# Patient Record
Sex: Male | Born: 1935 | Race: White | Hispanic: No | Marital: Married | Smoking: Never smoker
Health system: Southern US, Community
[De-identification: ages and names within clinical notes are randomized; demographics above are authoritative.]

## PROBLEM LIST (undated history)

## (undated) DIAGNOSIS — I10 Essential (primary) hypertension: Secondary | ICD-10-CM

## (undated) DIAGNOSIS — E7800 Pure hypercholesterolemia, unspecified: Secondary | ICD-10-CM

## (undated) DIAGNOSIS — I639 Cerebral infarction, unspecified: Secondary | ICD-10-CM

## (undated) DIAGNOSIS — J189 Pneumonia, unspecified organism: Secondary | ICD-10-CM

## (undated) DIAGNOSIS — C801 Malignant (primary) neoplasm, unspecified: Secondary | ICD-10-CM

## (undated) DIAGNOSIS — R269 Unspecified abnormalities of gait and mobility: Secondary | ICD-10-CM

## (undated) HISTORY — DX: Essential (primary) hypertension: I10

## (undated) HISTORY — DX: Unspecified abnormalities of gait and mobility: R26.9

## (undated) HISTORY — DX: Pure hypercholesterolemia, unspecified: E78.00

---

## 2001-04-12 ENCOUNTER — Ambulatory Visit (HOSPITAL_COMMUNITY): Admission: RE | Admit: 2001-04-12 | Discharge: 2001-04-12 | Payer: Self-pay | Admitting: *Deleted

## 2017-02-04 ENCOUNTER — Encounter: Payer: Self-pay | Admitting: Neurology

## 2017-02-04 ENCOUNTER — Ambulatory Visit (INDEPENDENT_AMBULATORY_CARE_PROVIDER_SITE_OTHER): Payer: Medicare Other | Admitting: Neurology

## 2017-02-04 VITALS — BP 139/74 | HR 58 | Ht 70.0 in | Wt 203.5 lb

## 2017-02-04 DIAGNOSIS — E538 Deficiency of other specified B group vitamins: Secondary | ICD-10-CM | POA: Diagnosis not present

## 2017-02-04 DIAGNOSIS — R269 Unspecified abnormalities of gait and mobility: Secondary | ICD-10-CM | POA: Diagnosis not present

## 2017-02-04 DIAGNOSIS — G3281 Cerebellar ataxia in diseases classified elsewhere: Secondary | ICD-10-CM

## 2017-02-04 HISTORY — DX: Unspecified abnormalities of gait and mobility: R26.9

## 2017-02-04 NOTE — Patient Instructions (Signed)
   We will get blood work today and get MRI of the brain. 

## 2017-02-04 NOTE — Progress Notes (Signed)
Reason for visit: Gait disorder  Referring physician: Dr. Deliah Goody is a 83 y.o. male  History of present illness:  Gabriel [name] is an 83 year old right-handed white male with a history of a gait disorder that has come on slowly over the last 1 year.  The patient has had a gradual change in his balance, he is walking slower and he is having increasing problems with balance.  He has had 2 occasions where his body got ahead of his feet and he almost fell down.  He has not sustained any falls, however.  He reports no numbness or tingling sensations or weakness of the extremities.  He reports no significant neck pain or back pain or pain down the arms or legs.  He denies any problems controlling the bowels with the bladder.  He has a sensation that he has to focus on moving the legs, he does not walk naturally at this point.  He denies any headaches or dizziness, he has not had any change in memory or cognitive functioning.  He does not use a cane or a walker for ambulation.  He has not noted any tremors or changes in handwriting.  He is sent to this office for further evaluation.  Past Medical History:  Diagnosis Date  . Gait disorder 02/04/2017  . High cholesterol   . Hypertension     No past surgical history on file.  Family History  Problem Relation Age of Onset  . Heart attack Mother   . Heart disease Father   . Cancer - Prostate Father   . Cancer Brother        renal    Social history:  reports that he has never smoked. He has never used smokeless tobacco. He reports that he drinks alcohol. He reports that he does not use drugs.  Medications:  Prior to Admission medications   Medication Sig Start Date End Date Taking? Authorizing Provider  aspirin 81 MG tablet Take 81 mg by mouth daily.   Yes [provider]  atorvastatin (LIPITOR) 10 MG tablet Take 10 mg by mouth daily. 11/20/16  Yes [provider]  lisinopril (PRINIVIL,ZESTRIL) 10 MG tablet  Take 10 mg by mouth daily. 11/20/16  Yes [provider]     Not on File  ROS:  Out of a complete 14 system review of symptoms, the patient complains only of the following symptoms, and all other reviewed systems are negative.  Balance problems  Blood pressure 139/74, pulse (!) 58, height 5\' 10"  (1.778 m), weight 203 lb 8 oz (92.3 kg).  Physical Exam  General: The patient is alert and cooperative at the time of the examination.  Eyes: Pupils are equal, round, and reactive to light. Discs are flat bilaterally.  Neck: The neck is supple, no carotid bruits are noted.  Respiratory: The respiratory examination is clear.  Cardiovascular: The cardiovascular examination reveals a regular rate and rhythm, no obvious murmurs or rubs are noted.  Skin: Extremities are without significant edema.  Neurologic Exam  Mental status: The patient is alert and oriented x 3 at the time of the examination. The patient has apparent normal recent and remote memory, with an apparently normal attention span and concentration ability.  Cranial nerves: Facial symmetry is present. There is good sensation of the face to pinprick and soft touch bilaterally. The strength of the facial muscles and the muscles to head turning and shoulder shrug are normal bilaterally. Speech is well enunciated,  no aphasia or dysarthria is noted. Extraocular movements are full. Visual fields are full. The tongue is midline, and the patient has symmetric elevation of the soft palate. No obvious hearing deficits are noted.  Motor: The motor testing reveals 5 over 5 strength of all 4 extremities. Good symmetric motor tone is noted throughout.  Sensory: Sensory testing is intact to pinprick, soft touch and vibration sensation on all 4 extremities.  Position sensation appears to be decreased on all 4 extremities.  No evidence of extinction is noted.  There is no evidence of a stocking pattern pinprick sensory  deficit.  Coordination: Cerebellar testing reveals good finger-nose-finger and heel-to-shin bilaterally.  Gait and station: The patient is able to arise from a seated position with arms crossed.  Once up, he is able to ambulate independently, stance is wide-based, he has good arm swing with walking, he is not stooped.  Tandem gait is unsteady.  Romberg is negative, but is unsteady.  Reflexes: Deep tendon reflexes are symmetric, but is slightly decreased bilaterally.  Ankle jerk reflexes are present but are trace.  Toes are downgoing bilaterally.   Assessment/Plan:  1.  Gait disturbance  The clinical examination shows decreased position sense on all 4 extremities.  The patient does not appear to have features of parkinsonism at this time.  The gait is somewhat wide-based, and may be related to the decreased position sense.  The patient will be set up for blood work today, he will have MRI of the brain.  If an explanation of his gait disturbance is not found, we will consider EMG and nerve conduction study in the future.  He will otherwise follow-up in about 4 months.  The MCV on the CBC is slightly elevated, vitamin B12 deficiency needs to be excluded.  Gabriel Alexanders MD 02/04/2017 9:04 AM  [facility] [address] [place], [place] [address]  Phone [phone] Fax [phone]

## 2017-02-06 LAB — VITAMIN B12: Vitamin B-12: 279 pg/mL (ref 232–1245)

## 2017-02-06 LAB — RPR: RPR Ser Ql: NONREACTIVE

## 2017-02-06 LAB — SEDIMENTATION RATE: Sed Rate: 11 mm/hr (ref 0–30)

## 2017-02-06 LAB — COPPER, SERUM: COPPER: 78 ug/dL (ref 72–166)

## 2017-02-08 ENCOUNTER — Ambulatory Visit (INDEPENDENT_AMBULATORY_CARE_PROVIDER_SITE_OTHER): Payer: Medicare Other | Admitting: Physician Assistant

## 2017-02-08 ENCOUNTER — Encounter: Payer: Self-pay | Admitting: Physician Assistant

## 2017-02-08 ENCOUNTER — Telehealth: Payer: Self-pay | Admitting: *Deleted

## 2017-02-08 ENCOUNTER — Ambulatory Visit (INDEPENDENT_AMBULATORY_CARE_PROVIDER_SITE_OTHER): Payer: Medicare Other

## 2017-02-08 VITALS — BP 144/75 | HR 66 | Temp 98.7°F | Resp 16 | Ht 70.0 in | Wt 200.2 lb

## 2017-02-08 DIAGNOSIS — R05 Cough: Secondary | ICD-10-CM

## 2017-02-08 DIAGNOSIS — J181 Lobar pneumonia, unspecified organism: Secondary | ICD-10-CM | POA: Diagnosis not present

## 2017-02-08 DIAGNOSIS — R059 Cough, unspecified: Secondary | ICD-10-CM

## 2017-02-08 DIAGNOSIS — J189 Pneumonia, unspecified organism: Secondary | ICD-10-CM

## 2017-02-08 IMAGING — DX DG CHEST 2V
2 series · 2 of 2 positions shown · non-contrast
Comparison: None.

CLINICAL DATA: Cough for 9 days.

EXAM:
CHEST  2 VIEW

[chest pa]
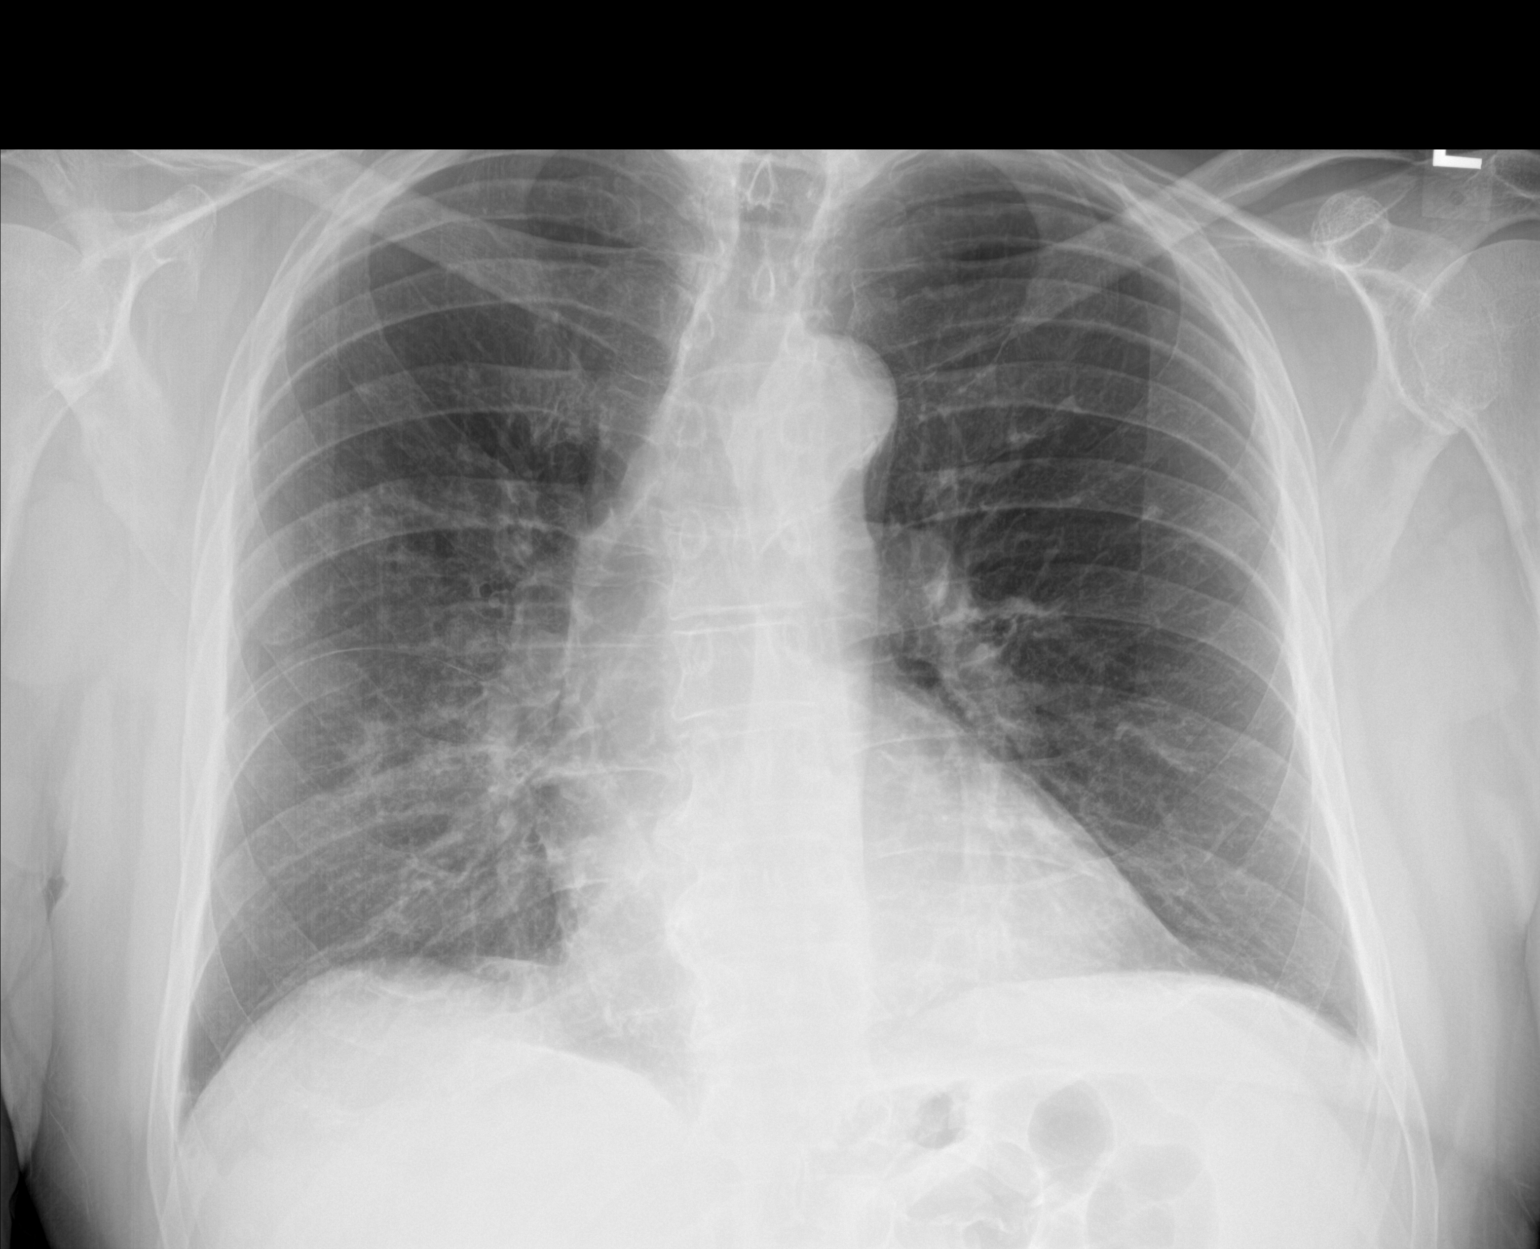

[chest lat]
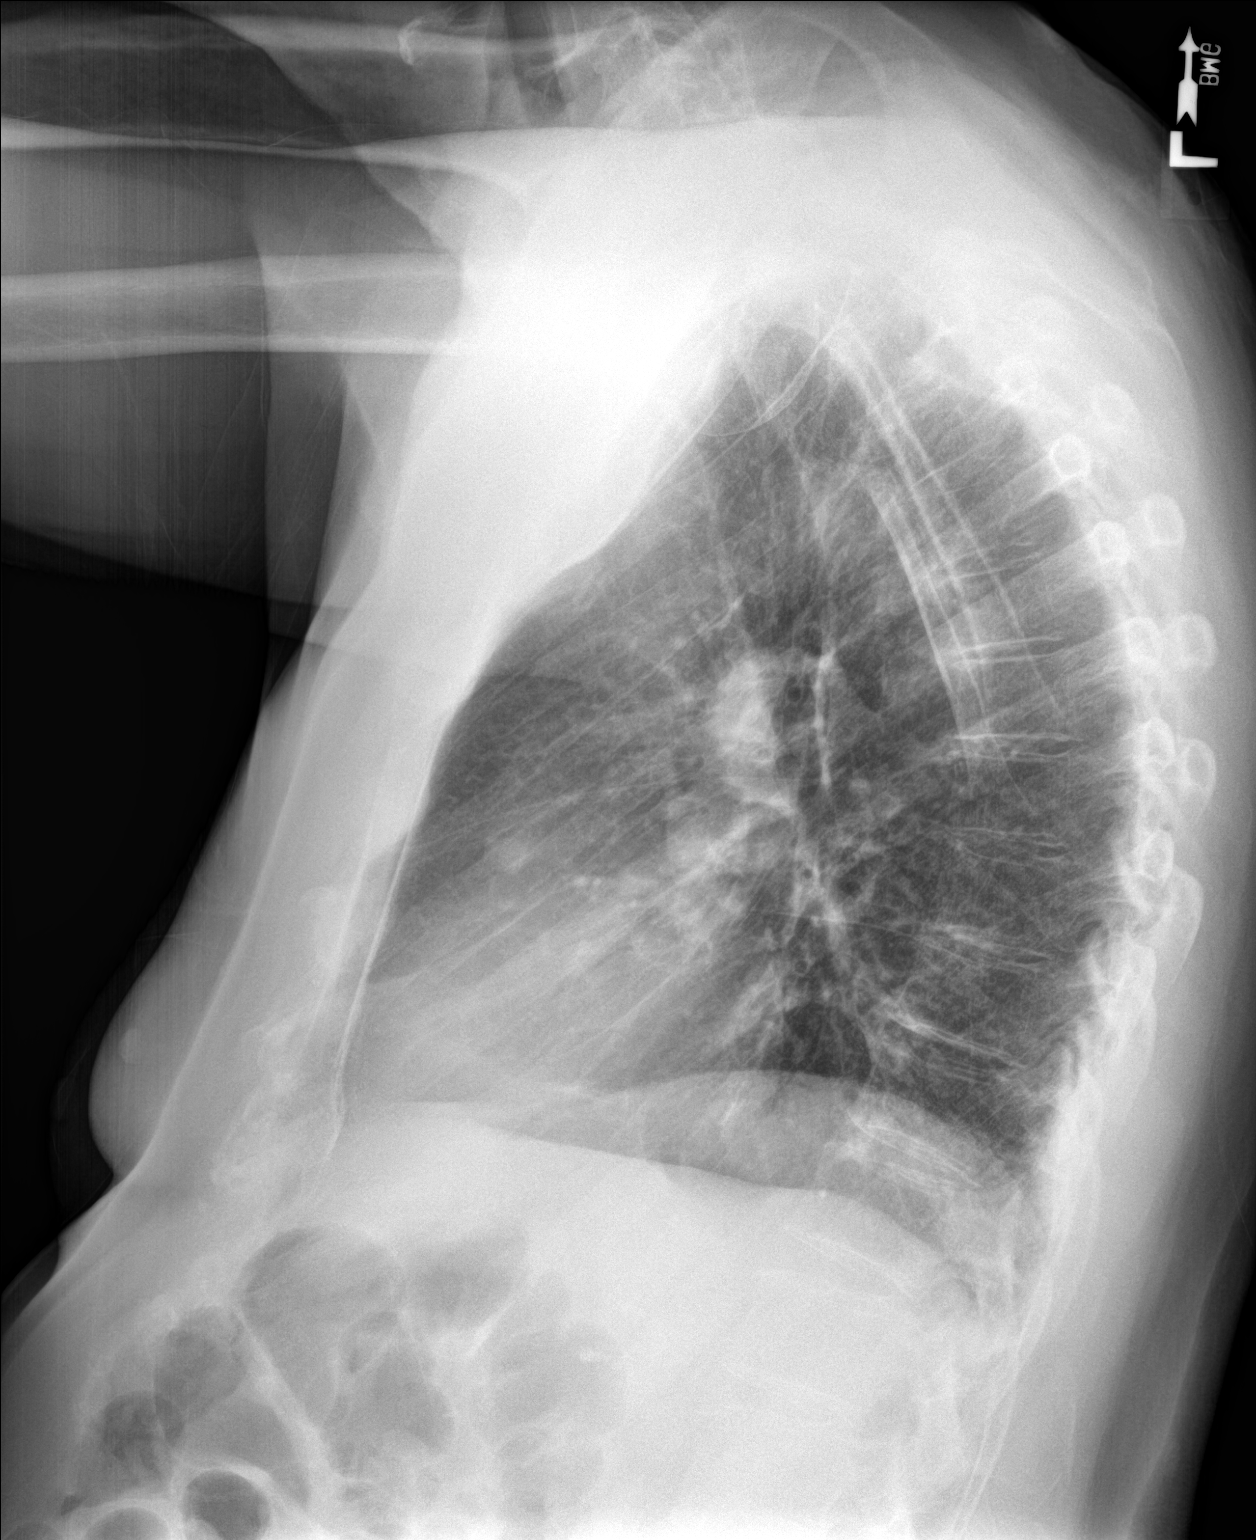

[2 of 2 positions shown; findings below may reference images not displayed]

FINDINGS: Patchy airspace disease is seen in the right upper and right middle
lobes most consistent with pneumonia. The left lung is clear. Heart
size is normal. No pneumothorax or pleural effusion.
IMPRESSION: Patchy right upper and right middle lobe airspace disease most
consistent with pneumonia. Recommend followup films to clearing.

## 2017-02-08 MED ORDER — AZITHROMYCIN 250 MG PO TABS: ORAL_TABLET | ORAL | 0 refills | Status: DC

## 2017-02-08 MED ORDER — BENZONATATE 100 MG PO CAPS: 100.0000 mg | ORAL_CAPSULE | Freq: Three times a day (TID) | ORAL | 0 refills | Status: DC | PRN

## 2017-02-08 NOTE — Telephone Encounter (Signed)
-----   Message from Kathrynn Ducking, MD sent at 02/06/2017  3:18 PM EDT -----  The blood work results are unremarkable. Please call the patient.  ----- Message ----- From: Lavone Neri Lab Results In Sent: 02/05/2017   7:44 AM To: Kathrynn Ducking, MD

## 2017-02-08 NOTE — Progress Notes (Signed)
Gabriel [name]  MRN: [id number] DOB: 06-24-1930  PCP: [name], MD  Subjective:  Pt is a pleasant 83 year old male PMH HTN, HLD who presents to clinic for cough x > 1 week. He is here today with his wife. Cough is productive and is worsening. He is sleeping upright at night for comfort.  He is taking OTC medications including cough drops, Mucinex, sudafed.  Denies night sweats, chest pain, palpitations, fever, chills, n/v, syncope. No known sick contacts.   Review of Systems  Constitutional: Negative for chills, diaphoresis, fatigue and fever.  HENT: Positive for congestion and rhinorrhea. Negative for postnasal drip, sinus pressure, sinus pain and sore throat.   Respiratory: Positive for cough. Negative for chest tightness, shortness of breath and wheezing.   Cardiovascular: Negative for chest pain and palpitations.  Gastrointestinal: Negative for diarrhea, nausea and vomiting.  Neurological: Negative for dizziness, syncope, light-headedness and headaches.  Psychiatric/Behavioral: Positive for sleep disturbance.    Patient Active Problem List   Diagnosis Date Noted  . Gait disorder 02/04/2017    Current Outpatient Medications on File Prior to Visit  Medication Sig Dispense Refill  . aspirin 81 MG tablet Take 81 mg by mouth daily.    Marland Kitchen atorvastatin (LIPITOR) 10 MG tablet Take 10 mg by mouth daily.  4  . lisinopril (PRINIVIL,ZESTRIL) 10 MG tablet Take 10 mg by mouth daily.  4   No current facility-administered medications on file prior to visit.     No Known Allergies   Objective:  There were no vitals taken for this visit.  Physical Exam  Constitutional: He is oriented to person, place, and time and well-developed, well-nourished, and in no distress. No distress.  HENT:  Right Ear: Tympanic membrane normal.  Left Ear: Tympanic membrane normal.  Nose: Mucosal edema and rhinorrhea present. Right sinus exhibits no maxillary sinus tenderness and no frontal sinus  tenderness. Left sinus exhibits no maxillary sinus tenderness and no frontal sinus tenderness.  Cardiovascular: Normal rate, regular rhythm and normal heart sounds.  Pulmonary/Chest: Effort normal. No accessory muscle usage. No respiratory distress. He has no wheezes. He has rhonchi in the right middle field and the right lower field.  Neurological: He is alert and oriented to person, place, and time. GCS score is 15.  Skin: Skin is warm and dry.  Psychiatric: Mood, memory, affect and judgment normal.  Vitals reviewed.  Dg Chest 2 View  Result Date: 02/08/2017 CLINICAL DATA:  Cough for 9 days. EXAM: CHEST  2 VIEW COMPARISON:  None. FINDINGS: Patchy airspace disease is seen in the right upper and right middle lobes most consistent with pneumonia. The left lung is clear. Heart size is normal. No pneumothorax or pleural effusion. IMPRESSION: Patchy right upper and right middle lobe airspace disease most consistent with pneumonia. Recommend followup films to clearing. Electronically Signed   By: Inge Rise M.D.   On: 02/08/2017 10:30    Assessment and Plan :  1. Pneumonia of right middle lobe due to infectious organism ([place]) - azithromycin (ZITHROMAX) 250 MG tablet; Take 2 tabs PO x 1 dose, then 1 tab PO QD x 4 days  Dispense: 6 tablet; Refill: 0 - Advised hydration and rest. RTC if no improvement. RTC in 7 to 12 weeks following treatment for a follow-up chest x-ray to ensure resolution of PNU. He understands and agrees with plan.  2. Cough - DG Chest 2 View; Future - benzonatate (TESSALON) 100 MG capsule; Take 1-2 capsules (100-200 mg total)  3 (three) times daily as needed by mouth for cough.  Dispense: 40 capsule; Refill: 0   Mercer Pod, PA-C  [facility] 02/08/2017 10:05 AM

## 2017-02-08 NOTE — Telephone Encounter (Signed)
Tried calling pt. Got automated message asking for remote access code. Unable to LVM. Will try again later.  OK to inform pt labs unremarkable per CW,MD note.  Someone should be calling soon to get him scheduled for MRI he ordered.

## 2017-02-08 NOTE — Patient Instructions (Addendum)
Your chest x-ray shows pneumonia. Start taking Azithromycin. Take the ENTIRE COURSE, even if you start to feel better before you are finished.  Come back in 7 to 12 weeks following treatment for a follow-up chest x-ray to make sure the infection has cleared.  Stay well hydrated. Drink at least 32-64 oz water daily.  Come back if your symptoms do not improve.   Thank you for coming in today. I hope you feel we met your needs.  Feel free to call PCP if you have any questions or further requests.  Please consider signing up for MyChart if you do not already have it, as this is a great way to communicate with me.  Best,  Whitney McVey, PA-C  Stay well hydrated. Get lost of rest.   Advil or ibuprofen for pain. Do not take Aspirin.  Throat lozenges (if you are not at risk for choking) or sprays may be used to soothe your throat. Drink enough water and fluids to keep your urine clear or pale yellow. For sore throat: ? Gargle with 8 oz of salt water ( tsp of salt per 1 qt of water) as often as every 1-2 hours to soothe your throat.  Gargle liquid benadryl.  Use Elderberry syrup.   For sore throat try using a honey-based tea. Use 3 teaspoons of honey with juice squeezed from half lemon. Place shaved pieces of ginger into 1/2-1 cup of water and warm over stove top. Then mix the ingredients and repeat every 4 hours as needed.  Cough Syrup Recipe: Sweet Lemon & Honey Thyme  Ingredients a handful of fresh thyme sprigs   1 pint of water (2 cups)  1/2 cup honey (raw is best, but regular will do)  1/2 lemon chopped Instructions 1. Place the lemon in the pint jar and cover with the honey. The honey will macerate the lemons and draw out liquids which taste so delicious! 2. Meanwhile, toss the thyme leaves into a saucepan and cover them with the water. 3. Bring the water to a gentle simmer and reduce it to half, about a cup of tea. 4. When the tea is reduced and cooled a bit, strain the sprigs &  leaves, add it into the pint jar and stir it well. 5. Give it a shake and use a spoonful as needed. 6. Store your homemade cough syrup in the refrigerator for about a month.  What causes a cough? In adults, common causes of a cough include: ?An infection of the airways or lungs (such as the common cold) ?Postnasal drip - Postnasal drip is when mucus from the nose drips down or flows along the back of the throat. Postnasal drip can happen when people have: .A cold .Allergies .A sinus infection - The sinuses are hollow areas in the bones of the face that open into the nose. ?Lung conditions, like asthma and chronic obstructive pulmonary disease (COPD) - Both of these conditions can make it hard to breathe. COPD is usually caused by smoking. ?Acid reflux - Acid reflux is when the acid that is normally in your stomach backs up into your esophagus (the tube that carries food from your mouth to your stomach). ?A side effect from blood pressure medicines called "ACE inhibitors" ?Smoking cigarettes  Is there anything I can do on my own to get rid of my cough? Yes. To help get rid of your cough, you can: ?Use a humidifier in your bedroom ?Use an over-the-counter cough medicine, or suck on cough  drops or hard candy ?Stop smoking, if you smoke ?If you have allergies, avoid the things you are allergic to (like pollen, dust, animals, or mold) If you have acid reflux, your doctor or nurse will tell you which lifestyle changes can help reduce symptoms.   IF you received an x-ray today, you will receive an invoice from [facility]. Please contact [facility] at [phone] with questions or concerns regarding your invoice.   IF you received labwork today, you will receive an invoice from [facility]. Please contact [facility] at [phone] with questions or concerns regarding your invoice.   Our billing staff will not be able to assist you with questions regarding bills from these  companies.  You will be contacted with the lab results as soon as they are available. The fastest way to get your results is to activate your My Chart account. Instructions are located on the last page of this paperwork. If you have not heard from [place] regarding the results in 2 weeks, please contact this office.

## 2017-02-09 NOTE — Telephone Encounter (Signed)
Called and spoke with patient about unremarkable labs per CW,MD note. He verbalized understanding.

## 2017-02-20 ENCOUNTER — Ambulatory Visit
Admission: RE | Admit: 2017-02-20 | Discharge: 2017-02-20 | Disposition: A | Discharge status: Home / Self Care | Payer: Medicare Other | Source: Ambulatory Visit | Attending: Neurology | Admitting: Neurology

## 2017-02-20 DIAGNOSIS — G3281 Cerebellar ataxia in diseases classified elsewhere: Secondary | ICD-10-CM | POA: Diagnosis not present

## 2017-02-22 ENCOUNTER — Telehealth: Payer: Self-pay | Admitting: Neurology

## 2017-02-22 DIAGNOSIS — R269 Unspecified abnormalities of gait and mobility: Secondary | ICD-10-CM

## 2017-02-22 NOTE — Telephone Encounter (Signed)
I called the patient.  The MRI of the brain shows significant generalized atrophy, the patient has a moderate level of small vessel disease.  This could have some impact on balance along with his age.  The clinical examination did show a reduction of position sense in both feet, we will get nerve conduction study and EMG evaluation to exclude a peripheral neuropathy as well.  The gait disorder may be multifactorial.  We will do nerve conduction studies on both legs, EMG on one leg.   MRI brain 02/21/17:  IMPRESSION:  This MRI of the brain without contrast shows the following: 1.   Severe generalized cortical atrophy. 2.   Chronic microvascular ischemic changes. 3.   Right Maxillary, right greater than left ethmoid and right frontal chronic sinusitis. 4.   There are no acute findings.

## 2017-03-09 ENCOUNTER — Ambulatory Visit (INDEPENDENT_AMBULATORY_CARE_PROVIDER_SITE_OTHER): Payer: Medicare Other | Admitting: Neurology

## 2017-03-09 ENCOUNTER — Encounter: Payer: Self-pay | Admitting: Neurology

## 2017-03-09 DIAGNOSIS — G3281 Cerebellar ataxia in diseases classified elsewhere: Secondary | ICD-10-CM

## 2017-03-09 DIAGNOSIS — R269 Unspecified abnormalities of gait and mobility: Secondary | ICD-10-CM

## 2017-03-09 DIAGNOSIS — R2689 Other abnormalities of gait and mobility: Secondary | ICD-10-CM | POA: Diagnosis not present

## 2017-03-09 NOTE — Procedures (Signed)
     HISTORY:  Gabriel [name] is an 83 year old gentleman with a history of a gait disturbance that has been progressive.  The patient denies any back pain or pain down the legs, he is being evaluated for a possible peripheral neuropathy as the clinical examination shows a depression in position sensation in the feet.  NERVE CONDUCTION STUDIES:  Nerve conduction studies were performed on both lower extremities.  The distal motor latency for the left peroneal nerve is slightly prolonged with a low motor amplitude.  The distal motor latency and motor amplitude for the right peroneal nerve was normal.  The distal motor latencies and motor amplitudes for the posterior tibial nerves were normal bilaterally.  The nerve conduction velocities for the peroneal and posterior tibial nerves were normal bilaterally.  The peroneal sensory latencies were within normal limits bilaterally.  The H reflex latencies were within normal limits bilaterally.  EMG STUDIES:  EMG study was performed on the left lower extremity:  The tibialis anterior muscle reveals 2 to 5K motor units with slightly decreased recruitment. No fibrillations or positive waves were seen. The peroneus tertius muscle reveals 2 to 5K motor units with slightly decreased recruitment. No fibrillations or positive waves were seen. The medial gastrocnemius muscle reveals 1 to 4K motor units with full recruitment. 1+ positive waves were seen. The vastus lateralis muscle reveals 2 to 4K motor units with full recruitment. No fibrillations or positive waves were seen. The iliopsoas muscle reveals 2 to 4K motor units with full recruitment. No fibrillations or positive waves were seen. The biceps femoris muscle (long head) reveals 2 to 4K motor units with full recruitment. No fibrillations or positive waves were seen. The lumbosacral paraspinal muscles were tested at 3 levels, and revealed no abnormalities of insertional activity at the upper level tested.   1+ positive waves were seen at the middle and lower levels.  There was good relaxation.   IMPRESSION:  Nerve conduction studies done on both lower extremities do not show clear evidence of a peripheral neuropathy.  There may be some primarily distal dysfunction of the left peroneal nerve.  The EMG evaluation of the left lower extremity shows evidence of a mild acute S1 radiculopathy, possibly some chronic stable changes at the L5 level as well.  Jill Alexanders MD 03/09/2017 4:06 PM  [facility] [address] [place], [place] [address]  Phone [phone] Fax [phone]

## 2017-03-09 NOTE — Progress Notes (Signed)
Please refer to EMG and nerve conduction study procedure note. 

## 2017-03-09 NOTE — Progress Notes (Addendum)
Patient comes in today for EMG and nerve conduction study evaluation.  The study does not show evidence of a peripheral neuropathy, EMG of the left leg shows evidence of a mild S1 acute radiculopathy, possibly some mild chronic changes at the L5 level as well.  The patient reports no pain in the back or down the leg  Review of the MRI of the brain shows diffuse atrophy, moderate level of small vessel disease, significant atrophy of the cerebellum is noted.  The patient will be set up for physical therapy for gait training.  We will follow-up after that.    [place]    Nerve / Sites Muscle Latency Ref. Amplitude Ref. Rel Amp Segments Distance Velocity Ref. Area    ms ms mV mV %  cm m/s m/s mVms  L Peroneal - EDB     Ankle EDB 6.7 ?6.5 1.0 ?2.0 100 Ankle - EDB 9   2.6     Fib head EDB 15.2  0.7  74.8 Fib head - Ankle 42 50 ?44 3.0     Pop fossa EDB 16.8  1.0  137 Pop fossa - Fib head 10 62 ?44 3.5         Pop fossa - Ankle      R Peroneal - EDB     Ankle EDB 4.5 ?6.5 3.5 ?2.0 100 Ankle - EDB 9   18.1     Fib head EDB 12.8  3.3  95.2 Fib head - Ankle 39 47 ?44 17.9     Pop fossa EDB 14.6  3.1  93 Pop fossa - Fib head 10 55 ?44 16.5         Pop fossa - Ankle      L Tibial - AH     Ankle AH 5.6 ?5.8 12.7 ?4.0 100 Ankle - AH 9   26.0     Pop fossa AH 15.8  7.9  62 Pop fossa - Ankle 47 46 ?41 21.3  R Tibial - AH     Ankle AH 4.9 ?5.8 9.6 ?4.0 100 Ankle - AH 9   25.4     Pop fossa AH 15.2  6.8  70.6 Pop fossa - Ankle 46 45 ?41 22.3             SNC    Nerve / Sites Rec. Site Peak Lat Ref.  Amp Ref. Segments Distance    ms ms V V  cm  R Superficial peroneal - Ankle     Lat leg Ankle 3.3 ?4.4 8 ?6 Lat leg - Ankle 14  L Superficial peroneal - Ankle     Lat leg Ankle 2.9 ?4.4 9 ?6 Lat leg - Ankle 14         H Reflex    Nerve H Lat Lat Hmax   ms ms   Left Right Ref. Left Right Ref.  Tibial - [place] 32.5 34.2 ?35.0 37.6 38.3 ?35.0         EMG

## 2017-03-19 ENCOUNTER — Ambulatory Visit: Payer: Medicare Other | Attending: Neurology | Admitting: Rehabilitative and Restorative Service Providers"

## 2017-03-19 ENCOUNTER — Encounter: Payer: Self-pay | Admitting: Rehabilitative and Restorative Service Providers"

## 2017-03-19 DIAGNOSIS — R2681 Unsteadiness on feet: Secondary | ICD-10-CM | POA: Diagnosis present

## 2017-03-19 DIAGNOSIS — R2689 Other abnormalities of gait and mobility: Secondary | ICD-10-CM

## 2017-03-19 NOTE — Patient Instructions (Signed)
Thoracic Self-Mobilization (Supine)    With rolled towel placed lengthwise at lower ribs level, lie back on towel with arms outstretched. Hold _2 minutes. Relax. Repeat __1-2__ times per day as needed for postural stretching.  http://orth.exer.us/1001   Copyright  VHI. All rights reserved.   Leg Extension (Hamstring)   Sit toward front edge of chair, with leg out straight, heel on floor, toes pointing toward body. Keeping back straight, bend forward at hip, breathing out through pursed lips.Hold for 20 seconds. Repeat 2 times. Repeat with other leg. Do 2 sessions per day. Variation: Perform from standing position, with support.

## 2017-03-19 NOTE — Therapy (Signed)
[facility] [address] [place], [place], [address] Phone: [phone]   Fax:  [phone]  Physical Therapy Evaluation  Patient Details  Name: Gabriel [name] MRN: [id number] Date of Birth: 03-Oct-1930 Referring Provider: Lenor Coffin, MD   Encounter Date: 03/19/2017  PT End of Session - 03/19/17 1634    Visit Number  1    Number of Visits  8    Date for PT Re-Evaluation  05/03/17 due to delay in scheduling around holidays    Authorization Type  MCR primary/ UHC secondary G code every 10th visit    PT Start Time  1105    PT Stop Time  1150    PT Time Calculation (min)  45 min    Activity Tolerance  Patient tolerated treatment well    Behavior During Therapy  [facility] for tasks assessed/performed       Past Medical History:  Diagnosis Date  . Gait disorder 02/04/2017  . High cholesterol   . Hypertension     History reviewed. No pertinent surgical history.  There were no vitals filed for this visit.   Subjective Assessment - 03/19/17 1107    Subjective  "It feels like he is walking through water" (per wife).  The patient notes a one year history of gait changes.  The patient's wife reports 2 occasions where he was hiking and needed assist to get back to the car.   Patient walks the dog each day.     Pertinent History  Recent MRI with cerebellar atrophy.      Patient Stated Goals  "I'd like to be dancing" patient notes in a joking tone.  His wife reports she would like for him to be safe while walking the dog.  Patient notes "improve mobility".     Currently in Pain?  No/denies         [facility] PT Assessment - 03/19/17 1110      Assessment   Medical Diagnosis  Gait instability/ gait training    Referring Provider  Lenor Coffin, MD    Onset Date/Surgical Date  -- gradual x 1 year    Prior Therapy  none      Precautions   Precautions  Fall      Restrictions   Weight Bearing Restrictions  No       Balance Screen   Has the patient fallen in the past 6 months  No    Has the patient had a decrease in activity level because of a fear of falling?   No    Is the patient reluctant to leave their home because of a fear of falling?   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  He has a full workshop at [employer] home.      Sensation  Light Touch  Appears Intact    Additional Comments  per neurology note has diminished position sense in toes.      Coordination   Gross Motor Movements are Fluid and Coordinated  Yes    Finger Nose Finger Test  WNLs      Posture/Postural Control   Posture/Postural Control  Postural limitations    Postural Limitations  Rounded Shoulders;Forward head      ROM / Strength   AROM / PROM / Strength  AROM;Strength      AROM   Overall AROM Comments  WFLs      Strength   Overall Strength  Within functional limits for tasks performed      Flexibility   Soft Tissue Assessment /Muscle Length  yes    Hamstrings  tightness noted during MMT as patient is not able to extend fully at knee while seated      Ambulation/Gait   Ambulation/Gait  Yes    Ambulation/Gait Assistance  7: Independent    Ambulation Distance (Feet)  400 Feet    Assistive device  None    Gait Pattern  -- to be further assessed.     Ambulation Surface  Level;Indoor    Gait velocity  2.31 ft/sec    Stairs  Yes    Stairs Assistance  6: Modified independent (Device/Increase time) slowed pace    Stair Management  Technique  Alternating pattern;One rail Right    Number of Stairs  4      Standardized Balance Assessment   Standardized Balance Assessment  Berg Balance Test      Berg Balance Test   Sit to Stand  Able to stand without using hands and stabilize independently    Standing Unsupported  Able to stand safely 2 minutes    Sitting with Back Unsupported but Feet Supported on Floor or Stool  Able to sit safely and securely 2 minutes    Stand to Sit  Sits safely with minimal use of hands    Transfers  Able to transfer safely, minor use of hands    Standing Unsupported with Eyes Closed  Able to stand 10 seconds safely    Standing Ubsupported with Feet Together  Able to place feet together independently and stand 1 minute safely    From Standing, Reach Forward with Outstretched Arm  Can reach forward >12 cm safely (5")    From Standing Position, Pick up Object from Floor  Able to pick up shoe safely and easily    From Standing Position, Turn to Look Behind Over each Shoulder  Turn sideways only but maintains balance    Turn 360 Degrees  Able to turn 360 degrees safely in 4 seconds or less    Standing Unsupported, Alternately Place Feet on Step/Stool  Able to complete 4 steps without aid or supervision    Standing Unsupported, One Foot in Front  Able to plae foot ahead of the other independently and hold 30 seconds    Standing on One Leg  Able to lift leg independently and hold equal to or more than 3 seconds    Total Score  48    Berg comment:  48/56 indicating low fall risk      High Level Balance   High Level Balance Comments  Heel and toe walking independently.             Objective measurements completed on examination: See above findings.  PT Education - 03-27-17 1143    Education provided  Yes    Education Details  HEP: hamstring stretch, anterior chest stretch    Person(s) Educated  Patient;Spouse    Methods  Explanation;Demonstration;Handout     Comprehension  Verbalized understanding;Returned demonstration          PT Long Term Goals - 2017-03-27 1636      PT LONG TERM GOAL #1   Title  The patient will be indep with HEP for high level balance and flexibility.    Time  6    Period  Weeks    Target Date  05/03/17      PT LONG TERM GOAL #2   Title  The patient will improve gait speed from 2.31 ft/sec to > or equal to 2.7 ft/sec to demo transition to "full community ambulator" classification of gait.    Time  6    Period  Weeks    Target Date  05/03/17      PT LONG TERM GOAL #3   Title  The patient will improve Berg from 48/56 to > or equal to 52/56 to demo improving high level balance control.    Time  6    Period  Weeks    Target Date  05/03/17      PT LONG TERM GOAL #4   Title  The patient will negotiate 1000' community surfaces independently without loss of balance including curbs, ramps, grass for improved confidence with mobility.    Time  6    Period  Weeks    Target Date  05/03/17             Plan - 27-Mar-2017 1638    Clinical Impression Statement  The patient is an 83 year old male presenting to OP PT with one year h/o declining gait and mobility.  He presents with low fall risk per Berg, decreased single limb and narrow base of support standing, mild gait changes, and postural rounding.  PT to address deficits to optimize current functional status.    History and Personal Factors relevant to plan of care:  HTN,     Clinical Presentation  Stable    Clinical Decision Making  Low    Rehab Potential  Good    Clinical Impairments Affecting Rehab Potential  patient has good family support    PT Frequency  2x / week    PT Duration  4 weeks set 45 day end date due to delay with schedule around holidays and patient travelling    PT Treatment/Interventions  ADLs/Self Care Home Management;Balance training;Neuromuscular re-education;Gait training;Stair training;Therapeutic activities;Therapeutic exercise;Functional  mobility training;Patient/family education    PT Next Visit Plan  Review HEP; further document gait assessment, high level balance activities; postural strengthening, flexibility LEs    Consulted and Agree with Plan of Care  Patient       Patient will benefit from skilled therapeutic intervention in order to improve the following deficits and impairments:  Abnormal gait, Decreased balance, Postural dysfunction, Impaired flexibility  Visit Diagnosis: Other abnormalities of gait and mobility  Unsteadiness on feet  G-Codes - Mar 27, 2017 1644    Functional Assessment Tool Used (Outpatient Only)  Berg=48/56, gait speed=2.31 ft/sec    Functional Limitation  Mobility: Walking and moving around    Mobility: Walking and Moving Around Current Status (I9518)  At least 20 percent but less than 40 percent impaired, limited or restricted    Mobility: Walking and Moving Around Goal Status (A4166)  At  least 1 percent but less than 20 percent impaired, limited or restricted        Problem List Patient Active Problem List   Diagnosis Date Noted  . Gait disorder 02/04/2017    Rome Echavarria, PT 03/19/2017, 4:45 PM  [facility] [address] [place], [place], [address] Phone: [phone]   Fax:  [phone]  Name: Gabriel [name] MRN: [id number] Date of Birth: [birth year]

## 2017-04-02 ENCOUNTER — Ambulatory Visit (INDEPENDENT_AMBULATORY_CARE_PROVIDER_SITE_OTHER): Payer: Medicare Other | Admitting: Physician Assistant

## 2017-04-02 ENCOUNTER — Other Ambulatory Visit: Payer: Self-pay

## 2017-04-02 ENCOUNTER — Ambulatory Visit (INDEPENDENT_AMBULATORY_CARE_PROVIDER_SITE_OTHER): Payer: Medicare Other

## 2017-04-02 ENCOUNTER — Encounter: Payer: Self-pay | Admitting: Physician Assistant

## 2017-04-02 VITALS — BP 110/80 | HR 60 | Temp 97.9°F | Resp 18 | Ht 70.0 in | Wt 208.8 lb

## 2017-04-02 DIAGNOSIS — J189 Pneumonia, unspecified organism: Secondary | ICD-10-CM

## 2017-04-02 DIAGNOSIS — J181 Lobar pneumonia, unspecified organism: Secondary | ICD-10-CM

## 2017-04-02 IMAGING — DX DG CHEST 2V
2 series · 2 of 2 positions shown · non-contrast
Comparison: [DATE] .

CLINICAL DATA: Follow-up pneumonia.

EXAM:
CHEST  2 VIEW

[chest pa]
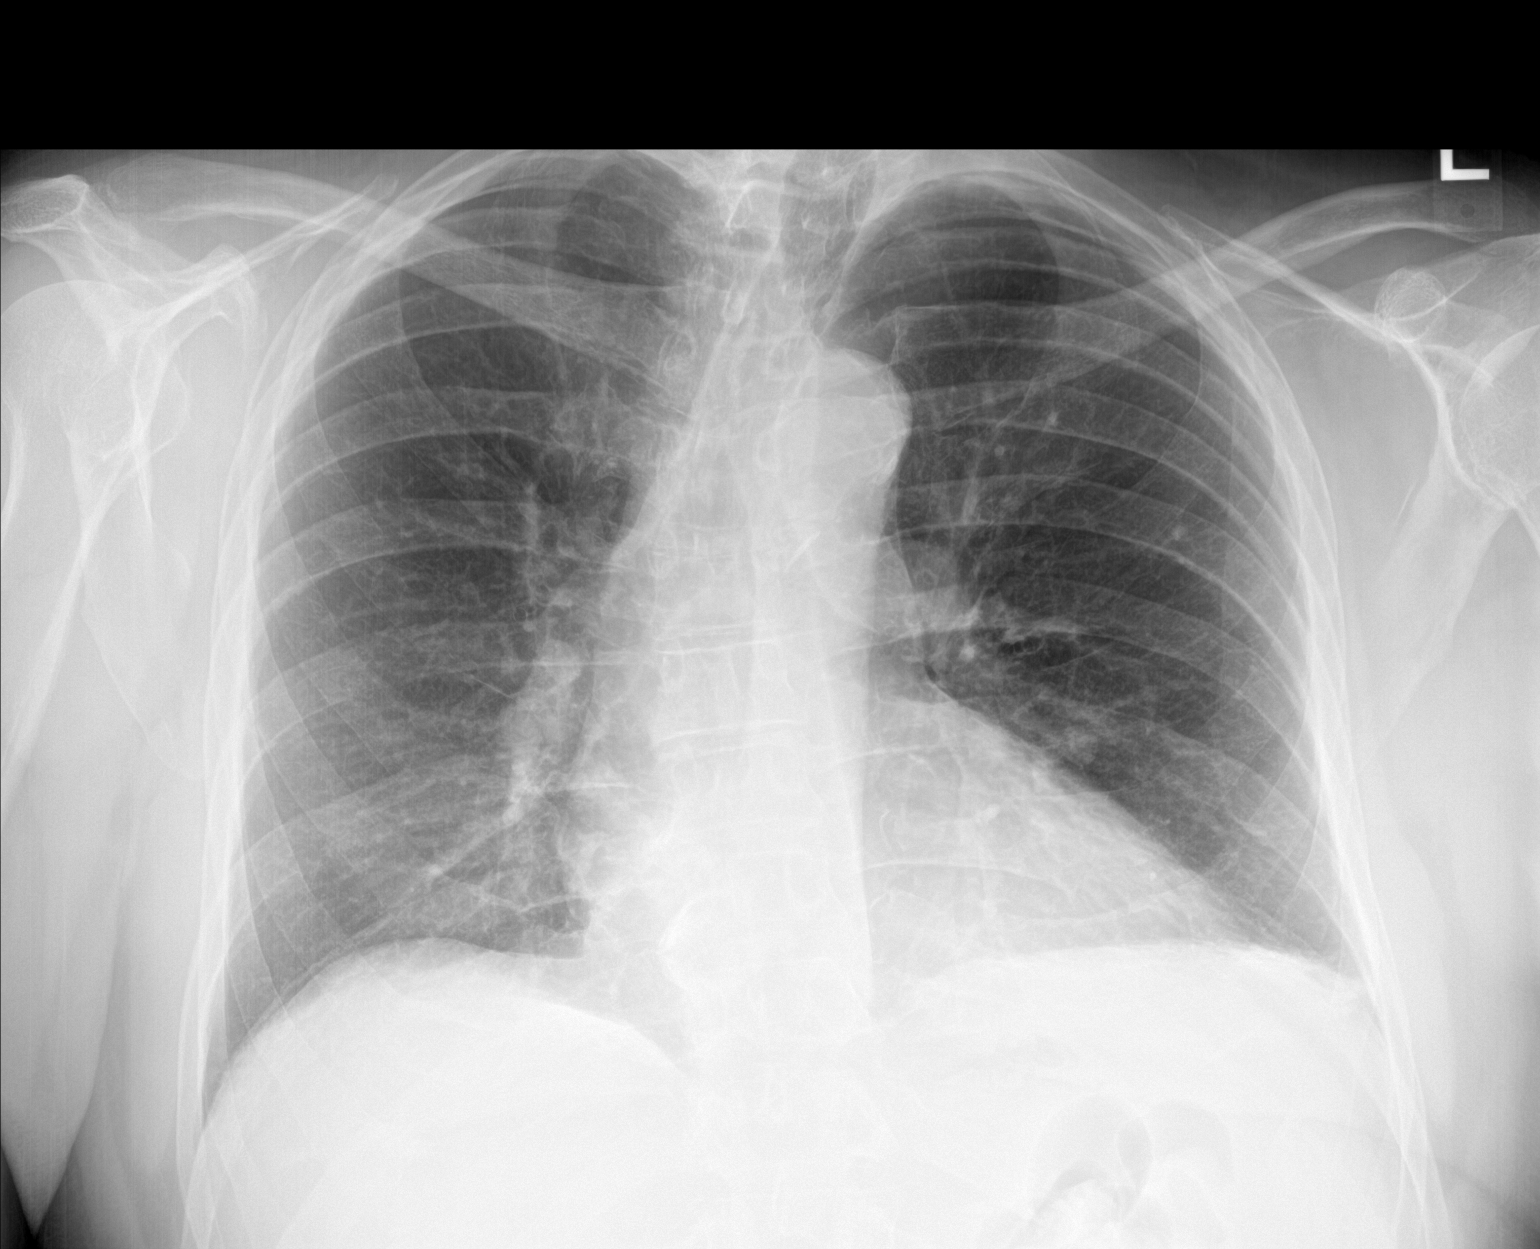

[chest lat]
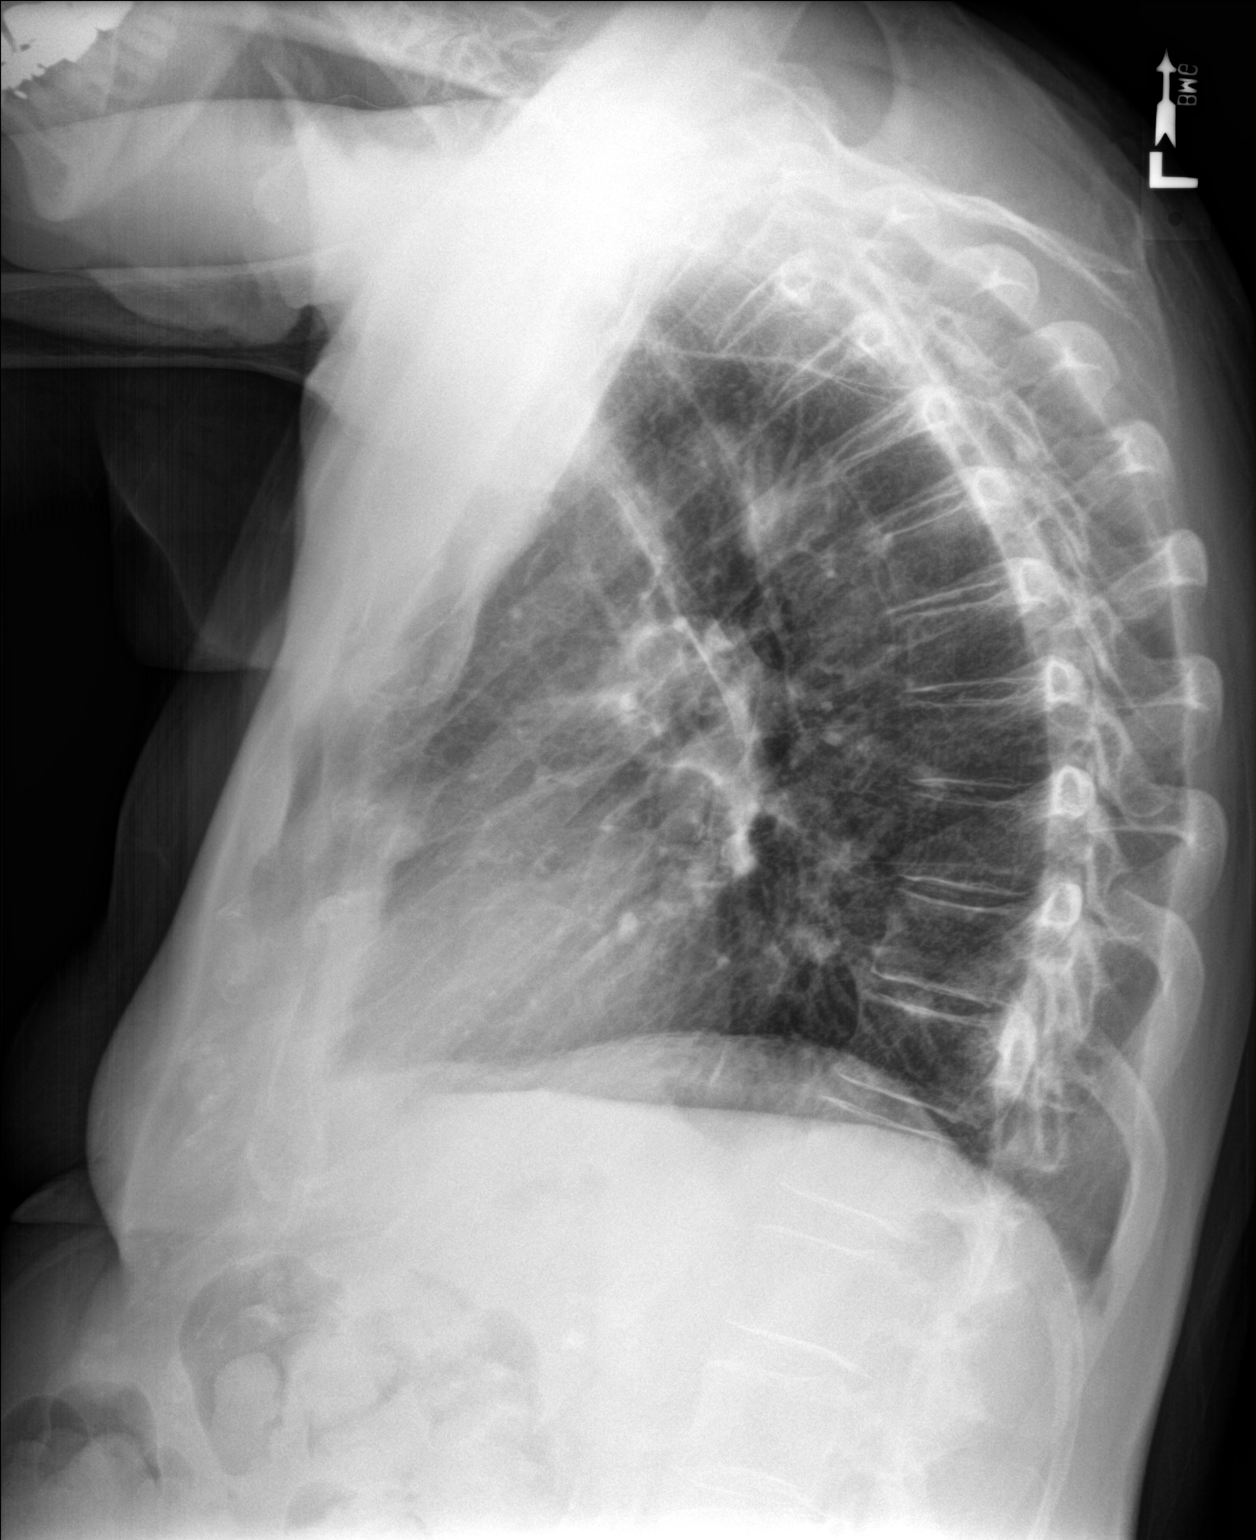

[2 of 2 positions shown; findings below may reference images not displayed]

FINDINGS: Mediastinum and hilar structures normal. Heart size normal. Partial
resolution of right base infiltrate. Left infrahilar mild infiltrate
with nodularity noted. Continued follow-up exams suggested
demonstrate resolution suggested. Stable nodular opacities scratched
it stable calcified nodular opacities left upper lung consistent
prior granulomas disease. Stable left base pleural thickening versus
small pleural effusion. No pneumothorax. No acute bony abnormality.
Thoracic spine scoliosis.
IMPRESSION: 1. Partial resolution of right base infiltrate.

2. Mild left infrahilar nodular infiltrate. Continued follow-up
exams suggested demonstrate resolution of these findings.

## 2017-04-02 NOTE — Progress Notes (Signed)
   Gabriel [name]  MRN: [id number] DOB: 07-16-1930  PCP: [name], MD  Subjective:  Pt is a pleasant 83 year old male who presents to clinic for f/u pneumonia. He was here 02/08/2017 for cough and diagnosed with PNU, treated with tessalon and Azithromycin. Asked to RTC in 7-12 weeks for repeat x-ray. He feels completely better. It took about 3 weeks for his cough to resolve. Denies cough, fever, chills, chest tightness, wheezing, shob.   Review of Systems  Constitutional: Negative for chills, diaphoresis, fatigue and fever.  Respiratory: Negative for cough, chest tightness, shortness of breath and wheezing.   Cardiovascular: Negative for chest pain and palpitations.    Patient Active Problem List   Diagnosis Date Noted  . Gait disorder 02/04/2017    Current Outpatient Medications on File Prior to Visit  Medication Sig Dispense Refill  . aspirin 81 MG tablet Take 81 mg by mouth daily.    Marland Kitchen atorvastatin (LIPITOR) 10 MG tablet Take 10 mg by mouth daily.  4  . benzonatate (TESSALON) 100 MG capsule Take 1-2 capsules (100-200 mg total) 3 (three) times daily as needed by mouth for cough. 40 capsule 0  . lisinopril (PRINIVIL,ZESTRIL) 10 MG tablet Take 10 mg by mouth daily.  4   No current facility-administered medications on file prior to visit.     No Known Allergies   Objective:  BP 110/80   Pulse 60   Temp 97.9 F (36.6 C) (Oral)   Resp 18   Ht 5\' 10"  (1.778 m)   Wt 208 lb 12.8 oz (94.7 kg)   SpO2 97%   BMI 29.96 kg/m   Physical Exam  Constitutional: He is oriented to person, place, and time and well-developed, well-nourished, and in no distress. No distress.  Cardiovascular: Normal rate, regular rhythm and normal heart sounds.  Pulmonary/Chest: Effort normal. He has decreased breath sounds in the left middle field. He has no wheezes. He has no rales.  Neurological: He is alert and oriented to person, place, and time. GCS score is 15.  Skin: Skin is warm and dry.    Psychiatric: Mood, memory, affect and judgment normal.  Vitals reviewed.  Dg Chest 2 View  Result Date: 04/02/2017 CLINICAL DATA:  Follow-up pneumonia. EXAM: CHEST  2 VIEW COMPARISON:  02/08/2017 . FINDINGS: Mediastinum and hilar structures normal. Heart size normal. Partial resolution of right base infiltrate. Left infrahilar mild infiltrate with nodularity noted. Continued follow-up exams suggested demonstrate resolution suggested. Stable nodular opacities scratched it stable calcified nodular opacities left upper lung consistent prior granulomas disease. Stable left base pleural thickening versus small pleural effusion. No pneumothorax. No acute bony abnormality. Thoracic spine scoliosis. IMPRESSION: 1. Partial resolution of right base infiltrate. 2. Mild left infrahilar nodular infiltrate. Continued follow-up exams suggested demonstrate resolution of these findings. Electronically Signed   By: Marcello Moores  Register   On: 04/02/2017 15:57    Assessment and Plan :  1. Pneumonia of right middle lobe due to infectious organism [facility]) - DG Chest 2 View; Future - Pt presents for f/u pneumonia. He was here 02/08/2017 diagnosed with PNU, treated with Azithromycin. Asked to RTC in 7-12 weeks for repeat x-ray. He is asymptomatic today. Vitals are stable. Today's chest x-ray shows partial resolution of right base infiltrate and mild left infrahilar nodular infiltrate. Recommend f/u imaging to ensure resolution. RTC in 1 month.   Mercer Pod, PA-C  [facility] 04/02/2017 3:37 PM

## 2017-04-02 NOTE — Patient Instructions (Addendum)
  Your chest x-ray shows a partial resolution of your pneumonia. The infection has not completely resolved. Healing may take another 5 weeks. Please come back in 1 month for follow-up x-ray. We need to make sure this clears up.  Stay well hydrated.  Do not smoke if you are a smoker.    Thank you for coming in today. I hope you feel we met your needs.  Feel free to call PCP if you have any questions or further requests.  Please consider signing up for MyChart if you do not already have it, as this is a great way to communicate with me.  Best,  Whitney McVey, PA-C  IF you received an x-ray today, you will receive an invoice from [facility]. Please contact [facility] at [phone] with questions or concerns regarding your invoice.   IF you received labwork today, you will receive an invoice from [facility]. Please contact [facility] at [phone] with questions or concerns regarding your invoice.   Our billing staff will not be able to assist you with questions regarding bills from these companies.  You will be contacted with the lab results as soon as they are available. The fastest way to get your results is to activate your My Chart account. Instructions are located on the last page of this paperwork. If you have not heard from [place] regarding the results in 2 weeks, please contact this office.

## 2017-04-05 ENCOUNTER — Ambulatory Visit: Payer: Medicare Other | Admitting: Physical Therapy

## 2017-04-05 ENCOUNTER — Encounter: Payer: Self-pay | Admitting: Physical Therapy

## 2017-04-05 DIAGNOSIS — R2689 Other abnormalities of gait and mobility: Secondary | ICD-10-CM

## 2017-04-05 DIAGNOSIS — R2681 Unsteadiness on feet: Secondary | ICD-10-CM

## 2017-04-05 NOTE — Patient Instructions (Signed)
Flexibility: Warehouse manager  __.   HOLD 30-45 secs   "W"    ALSO DO REACHING STRAIGHT UP - "Y" stretch - hold 30-45 secs  http://orth.exer.us/342   Copyright  VHI. All rights reserved.  Heel Raise: Unilateral (Standing)    Balance on left foot, then rise on ball of foot. Repeat _10___ times per set. Do _1_ sets per session. Do __1-2_ sessions per day.  http://orth.exer.us/40   Copyright  VHI. All rights reserved.  Heel Raise: Bilateral (Standing)    LIFT 10 times - hold 3-5 sec http://orth.exer.us/38   Copyright  VHI. All rights reserved.  SINGLE LIMB STANCE    Stance: single leg on floor. Raise leg. Hold _10__ seconds. Repeat with other leg. _1__ reps per set, __1_ sets per day, _5_ days per week  PRACTICE STANDING ON EACH LEG  Copyright  VHI. All rights reserved.

## 2017-04-05 NOTE — Therapy (Signed)
[facility] [address] [place], [place], [address] Phone: [phone]   Fax:  [phone]  Physical Therapy Treatment  Patient Details  Name: Gabriel [name] MRN: [id number] Date of Birth: [birth year] Referring Provider: Lenor Coffin, MD   Encounter Date: 04/05/2017  PT End of Session - 04/05/17 1402    Visit Number  2    Number of Visits  8    Date for PT Re-Evaluation  05/03/17    Authorization Type  MCR primary/ UHC secondary G code every 10th visit    PT Start Time  1101    PT Stop Time  1150    PT Time Calculation (min)  49 min       Past Medical History:  Diagnosis Date  . Gait disorder 02/04/2017  . High cholesterol   . Hypertension     History reviewed. No pertinent surgical history.  There were no vitals filed for this visit.  Subjective Assessment - 04/05/17 1351    Subjective  Wife reports pt has done the exercises approx. 1x/day - has been busy during the holidays; pt states he walked his dog approx. 1/2 mile this morning    Pertinent History  Recent MRI with cerebellar atrophy.      Patient Stated Goals  "I'd like to be dancing" patient notes in a joking tone.  His wife reports she would like for him to be safe while walking the dog.  Patient notes "improve mobility".     Currently in Pain?  No/denies                      [facility] Adult PT Treatment/Exercise - 04/05/17 1134      Transfers   Transfers  Sit to Stand    Number of Reps  Other reps (comment) 5    Comments  no UE support used from mat      Ambulation/Gait   Ambulation/Gait  Yes    Ambulation/Gait Assistance  5: Supervision    Ambulation/Gait Assistance Details  cues to increase step length and place heel down first in stance; noodle placed behind back to facilitate upright posture    Ambulation Distance (Feet)  350 Feet    Assistive device  None    Gait Pattern  Within Functional Limits decr. eccentric Lt  dorsiflexion    Ambulation Surface  Level;Indoor      Exercises   Exercises  Knee/Hip;Ankle      Knee/Hip Exercises: Stretches   Active Hamstring Stretch  Both;1 rep;30 seconds runner's stretch    Gastroc Stretch  Both;1 rep;30 seconds with 2" block      Knee/Hip Exercises: Standing   Heel Raises  Both;1 set;10 reps unilateral 10 reps        Pt performed ankle sways inside // bars 10 reps with UE support for balance recovery prn  Balance Exercises - 04/05/17 1359      Balance Exercises: Standing   SLS  Eyes open;Solid surface;2 reps;10 secs RLE and LLE    Tandem Gait  Forward;Intermittent upper extremity support;2 reps inside // bars    Other Standing Exercises  Pt performed crossovers front and back 2 reps each insdie // bars with UE support ptn        PT Education - 04/05/17 1400    Education provided  Yes    Education Details  Added 2 postural stretches to HEP - the "Y" and "W" stretch to facilitate upright posture:  SLS:  bilateral and unilateral heel raise for strengthening    Person(s) Educated  Patient;Spouse    Methods  Explanation;Demonstration;Handout    Comprehension  Verbalized understanding;Returned demonstration          PT Long Term Goals - 03/19/17 1636      PT LONG TERM GOAL #1   Title  The patient will be indep with HEP for high level balance and flexibility.    Time  6    Period  Weeks    Target Date  05/03/17      PT LONG TERM GOAL #2   Title  The patient will improve gait speed from 2.31 ft/sec to > or equal to 2.7 ft/sec to demo transition to "full community ambulator" classification of gait.    Time  6    Period  Weeks    Target Date  05/03/17      PT LONG TERM GOAL #3   Title  The patient will improve Berg from 48/56 to > or equal to 52/56 to demo improving high level balance control.    Time  6    Period  Weeks    Target Date  05/03/17      PT LONG TERM GOAL #4   Title  The patient will negotiate 1000' community surfaces  independently without loss of balance including curbs, ramps, grass for improved confidence with mobility.    Time  6    Period  Weeks    Target Date  05/03/17            Plan - 04/05/17 1402    Clinical Impression Statement  Pt's LLE is slightly weaker than RLE as pt has decr. eccentric dorsiflexion on LLE resulting in mild foot slap in stance phase of gait; this deviation is reduced with pt concentrating to place heel down first and to take larger steps    Rehab Potential  Good    Clinical Impairments Affecting Rehab Potential  patient has good family support    PT Frequency  2x / week    PT Duration  4 weeks    PT Treatment/Interventions  ADLs/Self Care Home Management;Balance training;Neuromuscular re-education;Gait training;Stair training;Therapeutic activities;Therapeutic exercise;Functional mobility training;Patient/family education    PT Next Visit Plan  check exs added to HEP on 04-05-17:  add runner's stretch and heel cord stretch to HEP:  cont balance and gait training - activities to facilitate upright posture    PT Home Exercise Plan  added "w" and "y" wall stretches for posture, heel raises, SLS on 04-05-17    Consulted and Agree with Plan of Care  Patient       Patient will benefit from skilled therapeutic intervention in order to improve the following deficits and impairments:  Abnormal gait, Decreased balance, Postural dysfunction, Impaired flexibility  Visit Diagnosis: Other abnormalities of gait and mobility  Unsteadiness on feet     Problem List Patient Active Problem List   Diagnosis Date Noted  . Gait disorder 02/04/2017    LXBWIO, MBTDH RCBULAG, PT 04/05/2017, 2:09 PM  [facility] [address] [place], [place], [address] Phone: [phone]   Fax:  [phone]  Name: Gabriel [name] MRN: [id number] Date of Birth: Mar 22, 1931

## 2017-04-08 ENCOUNTER — Encounter: Payer: Self-pay | Admitting: Rehabilitation

## 2017-04-08 ENCOUNTER — Ambulatory Visit: Payer: Medicare Other | Attending: Neurology | Admitting: Rehabilitation

## 2017-04-08 DIAGNOSIS — R2689 Other abnormalities of gait and mobility: Secondary | ICD-10-CM | POA: Insufficient documentation

## 2017-04-08 DIAGNOSIS — R2681 Unsteadiness on feet: Secondary | ICD-10-CM | POA: Diagnosis present

## 2017-04-08 NOTE — Patient Instructions (Signed)
Flexibility: Warehouse manager  __.   HOLD 30-45 secs   "W"    ALSO DO REACHING STRAIGHT UP - "Y" stretch - hold 30-45 secs  http://orth.exer.us/342   Copyright  VHI. All rights reserved.  Heel Raise: Unilateral (Standing)    Balance on left foot, then rise on ball of foot. Repeat _10___ times per set. Do _1_ sets per session. Do __1-2_ sessions per day.  http://orth.exer.us/40   Copyright  VHI. All rights reserved.  Heel Raise: Bilateral (Standing)    LIFT 10 times - hold 3-5 sec http://orth.exer.us/38   Copyright  VHI. All rights reserved.  SINGLE LIMB STANCE    Stance: single leg on floor. Raise leg. Hold _10__ seconds. Repeat with other leg. _1__ reps per set, __1_ sets per day, _5_ days per week  PRACTICE STANDING ON EACH LEG  Copyright  VHI. All rights reserved.   Calf / Gastoc: Runners' Stretch I    One leg back and straight, other forward and bent supporting weight, lean forward, gently stretching calf of back leg. Hold __60__ seconds. Repeat with other leg. Repeat _2___ times. Do _1-2___ sessions per day.  Copyright  VHI. All rights reserved.   Tandem Walking    Do beside of counter top for support as needed.  Be as light as you can and go slow to work on balance. Walk with each foot directly in front of other, heel of one foot touching toes of other foot with each step. Both feet straight ahead. Repeat x 3 reps down and back.    Copyright  VHI. All rights reserved.

## 2017-04-08 NOTE — Therapy (Signed)
[facility] [address] [place], [place], [address] Phone: [phone]   Fax:  [phone]  Physical Therapy Treatment  Patient Details  Name: Gabriel [name] MRN: [id number] Date of Birth: Mar 01, 1931 Referring Provider: Lenor Coffin, MD   Encounter Date: 04/08/2017  PT End of Session - 04/08/17 1512    Visit Number  3    Number of Visits  8    Date for PT Re-Evaluation  05/03/17    Authorization Type  MCR primary/ UHC secondary G code every 10th visit    PT Start Time  1400    PT Stop Time  1443    PT Time Calculation (min)  43 min    Activity Tolerance  Patient tolerated treatment well    Behavior During Therapy  [facility] for tasks assessed/performed       Past Medical History:  Diagnosis Date  . Gait disorder 02/04/2017  . High cholesterol   . Hypertension     History reviewed. No pertinent surgical history.  There were no vitals filed for this visit.  Subjective Assessment - 04/08/17 1405    Subjective  No changes, wife reports doing well with walking.  Reports "slipped" going up muddy hill today but caught himself with hand.      Pertinent History  Recent MRI with cerebellar atrophy.      Patient Stated Goals  "I'd like to be dancing" patient notes in a joking tone.  His wife reports she would like for him to be safe while walking the dog.  Patient notes "improve mobility".     Currently in Pain?  No/denies                      [facility] Adult PT Treatment/Exercise - 04/08/17 0001      Ambulation/Gait   Ambulation/Gait  Yes    Ambulation/Gait Assistance  5: Supervision    Ambulation/Gait Assistance Details  Continue to work on gait for improved gait quality with large stride length and improved heel contact esp on LLE.  Min cues for posture, but overall improved from previous sessions.     Ambulation Distance (Feet)  230 Feet    Assistive device  None    Gait Pattern  Poor foot  clearance - left decreased L eccentric DF, LLE ER    Ambulation Surface  Level;Indoor      Posture/Postural Control   Posture Comments  --      Neuro Re-ed    Neuro Re-ed Details   High level balance at counter top; marching in place with single UE support x 10 reps on each side with cues for slow speed and to be very light with UE support to better challenge balance.  Backwards gait x 3 reps with light UE support and tandem gait x 3 reps with light UE support and cues for posture throughout.  Note difficulty with       Exercises   Exercises  Other Exercises    Other Exercises   quadruped cat/camel stretch x 10 reps, quadruped alternating LEs x 5 reps on each side for core strength and balance with noted difficulty and weakness in UEs to maintain posture, prone alternating UE/LE extension x 10 reps to improve back strength.  Reviewed "W" and "Y" corner stretch from previous session.  Min cues for positioning from wall.  Performed each x 1 min.       Knee/Hip Exercises: Stretches   Other Knee/Hip  Stretches  Runner's stretch x 1 min on each side, added to HEP             PT Education - 04/08/17 1407    Education provided  Yes    Education Details  added to HEP, see pt instruction.  Provided education to work exercises into morning routine (while in kitchen doing balance and strengthening exercises) or when standing at bathroom counter/sink.      Person(s) Educated  Patient;Spouse    Methods  Explanation;Demonstration;Handout    Comprehension  Verbalized understanding;Returned demonstration          PT Long Term Goals - 03/19/17 1636      PT LONG TERM GOAL #1   Title  The patient will be indep with HEP for high level balance and flexibility.    Time  6    Period  Weeks    Target Date  05/03/17      PT LONG TERM GOAL #2   Title  The patient will improve gait speed from 2.31 ft/sec to > or equal to 2.7 ft/sec to demo transition to "full community ambulator" classification of  gait.    Time  6    Period  Weeks    Target Date  05/03/17      PT LONG TERM GOAL #3   Title  The patient will improve Berg from 48/56 to > or equal to 52/56 to demo improving high level balance control.    Time  6    Period  Weeks    Target Date  05/03/17      PT LONG TERM GOAL #4   Title  The patient will negotiate 1000' community surfaces independently without loss of balance including curbs, ramps, grass for improved confidence with mobility.    Time  6    Period  Weeks    Target Date  05/03/17            Plan - 04/08/17 1513    Clinical Impression Statement  Skilled session focused on review of added HEP, addition of calf stretch and tandem gait.  Also continue to work on postural exercises and carryover of improved posture with gait and mobility.     Rehab Potential  Good    Clinical Impairments Affecting Rehab Potential  patient has good family support    PT Frequency  2x / week    PT Duration  4 weeks    PT Treatment/Interventions  ADLs/Self Care Home Management;Balance training;Neuromuscular re-education;Gait training;Stair training;Therapeutic activities;Therapeutic exercise;Functional mobility training;Patient/family education    PT Next Visit Plan  Continue with postural exercises, balance, encouraging larger stride length    PT Home Exercise Plan  added "w" and "y" wall stretches for posture, heel raises, SLS on 04-05-17    Consulted and Agree with Plan of Care  Patient       Patient will benefit from skilled therapeutic intervention in order to improve the following deficits and impairments:  Abnormal gait, Decreased balance, Postural dysfunction, Impaired flexibility  Visit Diagnosis: Other abnormalities of gait and mobility  Unsteadiness on feet     Problem List Patient Active Problem List   Diagnosis Date Noted  . Gait disorder 02/04/2017    Cameron Sprang, PT, MPT [facility] [address] [place], [place], [address] Phone: [phone]   Fax:  [phone] 04/08/17, 3:15 PM  Name: Gabriel [name] MRN: [id number] Date of Birth: Feb 27, 1931

## 2017-04-12 ENCOUNTER — Ambulatory Visit: Payer: Medicare Other | Admitting: Rehabilitation

## 2017-04-12 ENCOUNTER — Encounter: Payer: Self-pay | Admitting: Rehabilitation

## 2017-04-12 DIAGNOSIS — R2689 Other abnormalities of gait and mobility: Secondary | ICD-10-CM

## 2017-04-12 DIAGNOSIS — R2681 Unsteadiness on feet: Secondary | ICD-10-CM

## 2017-04-12 NOTE — Therapy (Signed)
[facility] [address] [place], [place], [address] Phone: [phone]   Fax:  [phone]  Physical Therapy Treatment  Patient Details  Name: Gabriel [name] MRN: [id number] Date of Birth: Oct 18, 1930 Referring Provider: Lenor Coffin, MD   Encounter Date: 04/12/2017  PT End of Session - 04/12/17 1510    Visit Number  4    Number of Visits  8    Date for PT Re-Evaluation  05/03/17    Authorization Type  MCR primary/ UHC secondary G code every 10th visit    PT Start Time  1402    PT Stop Time  1446    PT Time Calculation (min)  44 min    Activity Tolerance  Patient tolerated treatment well    Behavior During Therapy  [facility] for tasks assessed/performed       Past Medical History:  Diagnosis Date  . Gait disorder 02/04/2017  . High cholesterol   . Hypertension     History reviewed. No pertinent surgical history.  There were no vitals filed for this visit.  Subjective Assessment - 04/12/17 1405    Subjective  Pt reports no changes, no falls.     Pertinent History  Recent MRI with cerebellar atrophy.      Patient Stated Goals  "I'd like to be dancing" patient notes in a joking tone.  His wife reports she would like for him to be safe while walking the dog.  Patient notes "improve mobility".     Currently in Pain?  No/denies                      [facility] Adult PT Treatment/Exercise - 04/12/17 0001      Ambulation/Gait   Ambulation/Gait  Yes    Ambulation/Gait Assistance  5: Supervision    Ambulation/Gait Assistance Details  Cues for upright posture, increased stride length, improved arm swing and improved L>R heel strike.  Did note improvement today vs previous session as he did not tend to "scuff" LLE as much as he usually does during gait.  Also note posture is improved.      Ambulation Distance (Feet)  500 Feet    Assistive device  None    Gait Pattern  Poor foot clearance - left    Ambulation Surface  Level;Indoor      Neuro Re-ed    Neuro Re-ed Details   High level balance tasks with emphasis on vestibular challenges, narrowing BOS and compliant surfaces.  Standing in corner, worked on standing on compliant surface with feet apart, EC x 2 sets of 30 secs, feet apart EO w/ head turns up/down and side/side x 10 reps each,  feet together EO x 2 sets of 30 secs, feet together EO w/ head turns x 10 reps each.  Added to HEP, see pt instruction.  Continued with balance having pt tap to cones alternating LEs x 7 reps progressing to tipping cone over and back upright to increase time spent in SLS.  Pt tolerated well with min/guard A intermittently to correct LOB.  Worked on improving hip strategy and postural control with wall bumps with feet apart on balance beam x 5 reps with 5 sec holds>feet together x 5 reps with 5 sec holds.  Again tolerated well with increased assist needed intiailly with feet together.               PT Education - 04/12/17 1409    Education provided  Yes    Education Details  balance exercises to HEP, importance of compliance with exercises daily    Person(s) Educated  Patient;Spouse    Methods  Explanation;Demonstration;Handout    Comprehension  Verbalized understanding          PT Long Term Goals - 03/19/17 1636      PT LONG TERM GOAL #1   Title  The patient will be indep with HEP for high level balance and flexibility.    Time  6    Period  Weeks    Target Date  05/03/17      PT LONG TERM GOAL #2   Title  The patient will improve gait speed from 2.31 ft/sec to > or equal to 2.7 ft/sec to demo transition to "full community ambulator" classification of gait.    Time  6    Period  Weeks    Target Date  05/03/17      PT LONG TERM GOAL #3   Title  The patient will improve Berg from 48/56 to > or equal to 52/56 to demo improving high level balance control.    Time  6    Period  Weeks    Target Date  05/03/17      PT LONG TERM GOAL #4    Title  The patient will negotiate 1000' community surfaces independently without loss of balance including curbs, ramps, grass for improved confidence with mobility.    Time  6    Period  Weeks    Target Date  05/03/17            Plan - 04/12/17 1510    Clinical Impression Statement  Skilled session focused on high level balance with emphasis on vestibular challenges with EC, narrow BOS, compliant surfaces and SLS.  Pt tolerated very well and added corner balance tasks to HEP.      Rehab Potential  Good    Clinical Impairments Affecting Rehab Potential  patient has good family support    PT Frequency  2x / week    PT Duration  4 weeks    PT Treatment/Interventions  ADLs/Self Care Home Management;Balance training;Neuromuscular re-education;Gait training;Stair training;Therapeutic activities;Therapeutic exercise;Functional mobility training;Patient/family education    PT Next Visit Plan  Continue with postural exercises, balance, encouraging larger stride length    PT Home Exercise Plan  added "w" and "y" wall stretches for posture, heel raises, SLS on 04-05-17    Consulted and Agree with Plan of Care  Patient       Patient will benefit from skilled therapeutic intervention in order to improve the following deficits and impairments:  Abnormal gait, Decreased balance, Postural dysfunction, Impaired flexibility  Visit Diagnosis: Other abnormalities of gait and mobility  Unsteadiness on feet     Problem List Patient Active Problem List   Diagnosis Date Noted  . Gait disorder 02/04/2017    Cameron Sprang, PT, MPT [facility] [address] [place], [place], [address] Phone: [phone]   Fax:  [phone] 04/12/17, 3:12 PM  Name: Gabriel [name] MRN: [id number] Date of Birth: 1931-03-18

## 2017-04-12 NOTE — Patient Instructions (Signed)
Feet Apart (Compliant Surface) Arm Motion - Eyes Closed    Stand on compliant surface: ___pillow_____, feet shoulder width apart. Close eyes and keep arms by your side.  Repeat __3__ times per session for 30 secs each. Do _1-2___ sessions per day.  Copyright  VHI. All rights reserved.   Feet Together (Compliant Surface) Head Motion - Eyes Open    With eyes open, standing on compliant surface: ___pillow_____, feet together, move head slowly: up and down x 10 reps, side to side x 10 reps Do __1-2__ sessions per day.  Copyright  VHI. All rights reserved.

## 2017-04-15 ENCOUNTER — Ambulatory Visit: Payer: Medicare Other | Admitting: Rehabilitative and Restorative Service Providers"

## 2017-04-15 ENCOUNTER — Encounter: Payer: Self-pay | Admitting: Rehabilitative and Restorative Service Providers"

## 2017-04-15 DIAGNOSIS — R2689 Other abnormalities of gait and mobility: Secondary | ICD-10-CM | POA: Diagnosis not present

## 2017-04-15 DIAGNOSIS — R2681 Unsteadiness on feet: Secondary | ICD-10-CM

## 2017-04-15 NOTE — Therapy (Signed)
[facility] [address] [place], [place], [address] Phone: [phone]   Fax:  [phone]  Physical Therapy Treatment  Patient Details  Name: Gabriel [name] MRN: [id number] Date of Birth: 01/30/1931 Referring Provider: Lenor Coffin, MD   Encounter Date: 04/15/2017  PT End of Session - 04/15/17 1409    Visit Number  5    Number of Visits  8    Date for PT Re-Evaluation  05/03/17    Authorization Type  MCR primary/ UHC secondary G code every 10th visit    PT Start Time  1405    PT Stop Time  1445    PT Time Calculation (min)  40 min    Equipment Utilized During Treatment  Gait belt    Activity Tolerance  Patient tolerated treatment well    Behavior During Therapy  [facility] for tasks assessed/performed       Past Medical History:  Diagnosis Date  . Gait disorder 02/04/2017  . High cholesterol   . Hypertension     History reviewed. No pertinent surgical history.  There were no vitals filed for this visit.  Subjective Assessment - 04/15/17 1457    Subjective  The patient reports he is performing HEP 1 x per day.    Pertinent History  Recent MRI with cerebellar atrophy.      Patient Stated Goals  "I'd like to be dancing" patient notes in a joking tone.  His wife reports she would like for him to be safe while walking the dog.  Patient notes "improve mobility".     Currently in Pain?  No/denies                      [facility] Adult PT Treatment/Exercise - 04/15/17 1410      Ambulation/Gait   Ambulation/Gait  Yes    Ambulation/Gait Assistance  5: Supervision    Ambulation/Gait Assistance Details  Cues for upright posture and longer step length emphasizing L heel strike with verbal cues.     Ambulation Distance (Feet)  500 Feet    Assistive device  None    Gait Pattern  Poor foot clearance - left    Ambulation Surface  Level;Indoor      Self-Care   Self-Care  Other Self-Care Comments    Other  Self-Care Comments   Discussed community exercise options for long term wellness based on locastion/convenience.  Provided handout for ACT by [employer].       Neuro Re-ed    Neuro Re-ed Details   Marching in place with intermittent holds for single leg stance control without UE support with CGA.  Side-stepping with tactile and verbal cues for core activation and technique x 20 feet x 4 reps.  Heel walking and toe walking emphasizing quad activation and LE strengthening with CGA to min A x 115 feet with alternating toes/heels.  Large amplitude anterior stepping, lateral reaching and mini lunges working on control of base of support and stepping outside of midline>return.        Exercises   Other Exercises   sit<>stand x 10 reps without UE support.                   PT Long Term Goals - 03/19/17 1636      PT LONG TERM GOAL #1   Title  The patient will be indep with HEP for high level balance and flexibility.    Time  6  Period  Weeks    Target Date  05/03/17      PT LONG TERM GOAL #2   Title  The patient will improve gait speed from 2.31 ft/sec to > or equal to 2.7 ft/sec to demo transition to "full community ambulator" classification of gait.    Time  6    Period  Weeks    Target Date  05/03/17      PT LONG TERM GOAL #3   Title  The patient will improve Berg from 48/56 to > or equal to 52/56 to demo improving high level balance control.    Time  6    Period  Weeks    Target Date  05/03/17      PT LONG TERM GOAL #4   Title  The patient will negotiate 1000' community surfaces independently without loss of balance including curbs, ramps, grass for improved confidence with mobility.    Time  6    Period  Weeks    Target Date  05/03/17            Plan - 04/15/17 1459    Clinical Impression Statement  The patient has dec'd L foot clearance with gait with slower L LE motor timing noted.  With verbal and demo cues, he is able to improve within session.  PT continuing to  work on LE strength, posture, balance and general mobility.  Encouraged continuation of current HEP and began discussing long term wellness options.     PT Treatment/Interventions  ADLs/Self Care Home Management;Balance training;Neuromuscular re-education;Gait training;Stair training;Therapeutic activities;Therapeutic exercise;Functional mobility training;Patient/family education    PT Next Visit Plan  Posture, balance, LE strength, larger stride length/gait training, backwards walking and hip extension strengthening    Consulted and Agree with Plan of Care  Patient       Patient will benefit from skilled therapeutic intervention in order to improve the following deficits and impairments:  Abnormal gait, Decreased balance, Postural dysfunction, Impaired flexibility  Visit Diagnosis: Other abnormalities of gait and mobility  Unsteadiness on feet     Problem List Patient Active Problem List   Diagnosis Date Noted  . Gait disorder 02/04/2017    Terianne Thaker, PT 04/15/2017, 3:00 PM  [facility] [address] [place], [place], [address] Phone: [phone]   Fax:  [phone]  Name: KNOLAN SIMIEN MRN: [id number] Date of Birth: [birth year]

## 2017-04-19 ENCOUNTER — Encounter: Payer: Self-pay | Admitting: Rehabilitative and Restorative Service Providers"

## 2017-04-19 ENCOUNTER — Ambulatory Visit: Payer: Medicare Other | Admitting: Rehabilitative and Restorative Service Providers"

## 2017-04-19 DIAGNOSIS — R2681 Unsteadiness on feet: Secondary | ICD-10-CM

## 2017-04-19 DIAGNOSIS — R2689 Other abnormalities of gait and mobility: Secondary | ICD-10-CM

## 2017-04-19 NOTE — Therapy (Signed)
[facility] [address] [place], [place], [address] Phone: [phone]   Fax:  [phone]  Physical Therapy Treatment  Patient Details  Name: Gabriel [name] MRN: [id number] Date of Birth: 1930/06/10 Referring Provider: Lenor Coffin, MD   Encounter Date: 04/19/2017  PT End of Session - 04/19/17 1506    Visit Number  6    Number of Visits  8    Date for PT Re-Evaluation  05/03/17    Authorization Type  MCR primary/ UHC secondary G code every 10th visit    PT Start Time  1409    PT Stop Time  1448    PT Time Calculation (min)  39 min    Equipment Utilized During Treatment  Gait belt    Activity Tolerance  Patient tolerated treatment well    Behavior During Therapy  [facility] for tasks assessed/performed       Past Medical History:  Diagnosis Date  . Gait disorder 02/04/2017  . High cholesterol   . Hypertension     History reviewed. No pertinent surgical history.  There were no vitals filed for this visit.  Subjective Assessment - 04/19/17 1410    Subjective  The patient reports he is doing home program.  He notes he has had one episode of slipping in the mud when walking in the past couple of weeks.     Pertinent History  Recent MRI with cerebellar atrophy.      Patient Stated Goals  "I'd like to be dancing" patient notes in a joking tone.  His wife reports she would like for him to be safe while walking the dog.  Patient notes "improve mobility".     Currently in Pain?  No/denies                      [facility] Adult PT Treatment/Exercise - 04/19/17 1506      Self-Care   Self-Care  Other Self-Care Comments    Other Self-Care Comments   Patient has access to [place] classes as he is on waiting list for residence.  PT recommended he bring a calendar to discuss class options (PT can provide input on which classes to begin with).      Neuro Re-ed    Neuro Re-ed Details   Heel walking/ toe  walking, marching in place, backwards walking, mini lunges anteriorly and posteriorly.  Compliant surface standing with eyes closed and CGA to min A, compliant standing with head motion narrowing base of support.         Exercises   Exercises  Other Exercises    Other Exercises   Reviewed heel raises practicing unilateral and bilateral.  Recommended patient perform bilateral heel raises at home as he is not doing unilateral due to "too difficult".              PT Education - 04/19/17 1500    Education provided  Yes    Education Details  Discussed home program and d/c'd unilateral heel raises and supine towel roll stretch    Person(s) Educated  Patient    Methods  Explanation;Demonstration;Handout    Comprehension  Verbalized understanding;Returned demonstration          PT Long Term Goals - 03/19/17 1636      PT LONG TERM GOAL #1   Title  The patient will be indep with HEP for high level balance and flexibility.    Time  6  Period  Weeks    Target Date  05/03/17      PT LONG TERM GOAL #2   Title  The patient will improve gait speed from 2.31 ft/sec to > or equal to 2.7 ft/sec to demo transition to "full community ambulator" classification of gait.    Time  6    Period  Weeks    Target Date  05/03/17      PT LONG TERM GOAL #3   Title  The patient will improve Berg from 48/56 to > or equal to 52/56 to demo improving high level balance control.    Time  6    Period  Weeks    Target Date  05/03/17      PT LONG TERM GOAL #4   Title  The patient will negotiate 1000' community surfaces independently without loss of balance including curbs, ramps, grass for improved confidence with mobility.    Time  6    Period  Weeks    Target Date  05/03/17            Plan - 04/19/17 1511    Clinical Impression Statement  The patient is demonstrating improving posture and clearance L foot today.  He is performing some of HEP.  PT consolidated program by d/c'ing 2 of them that  were repetitive in nature.  May have to consolidate further due to number of activities.     PT Treatment/Interventions  ADLs/Self Care Home Management;Balance training;Neuromuscular re-education;Gait training;Stair training;Therapeutic activities;Therapeutic exercise;Functional mobility training;Patient/family education    PT Next Visit Plan  Begin checking LTGs; balance, hip extension strengthening.    Consulted and Agree with Plan of Care  Patient       Patient will benefit from skilled therapeutic intervention in order to improve the following deficits and impairments:  Abnormal gait, Decreased balance, Postural dysfunction, Impaired flexibility  Visit Diagnosis: Other abnormalities of gait and mobility  Unsteadiness on feet     Problem List Patient Active Problem List   Diagnosis Date Noted  . Gait disorder 02/04/2017    Vandella Ord, PT 04/19/2017, 3:12 PM  [facility] [address] [place], [place], [address] Phone: [phone]   Fax:  [phone]  Name: Gabriel [name] MRN: [id number] Date of Birth: 26-Jun-1930

## 2017-04-19 NOTE — Patient Instructions (Signed)
Leg Extension (Hamstring)   Sit toward front edge of chair, with leg out straight, heel on floor, toes pointing toward body. Keeping back straight, bend forward at hip, breathing out through pursed lips.Hold for 20 seconds. Repeat 2 times. Repeat with other leg. Do 2 sessions per day. Variation: Perform from standing position, with support.   Flexibility: Warehouse manager  __.   HOLD 30-45 secs "W"    ALSO DO REACHING STRAIGHT UP - "Y" stretch - hold 30-45 secs  http://orth.exer.us/342  Copyright  VHI. All rights reserved.  Heel Raise: Bilateral (Standing)    LIFT 10 times - hold 3-5 sec http://orth.exer.us/38  Copyright  VHI. All rights reserved. SINGLE LIMB STANCE    Stance: single leg on floor. Raise leg. Hold _10__ seconds. Repeat with other leg. _1__ reps per set, __1_ sets per day, _5_ days per week  PRACTICE STANDING ON EACH LEG  Copyright  VHI. All rights reserved.  Calf / Gastoc: Runners' Stretch I    One leg back and straight, other forward and bent supporting weight, lean forward, gently stretching calf of back leg. Hold __60__ seconds. Repeat with other leg. Repeat _2___ times. Do _1-2___ sessions per day.  Copyright  VHI. All rights reserved.   Tandem Walking    Do beside of counter top for support as needed.  Be as light as you can and go slow to work on balance. Walk with each foot directly in front of other, heel of one foot touching toes of other foot with each step. Both feet straight ahead. Repeat x 3 reps down and back.    Copyright  VHI. All rights reserved.   Feet Apart (Compliant Surface) Arm Motion - Eyes Closed    Stand on compliant surface: ___pillow_____, feet shoulder width apart. Close eyes and keep arms by your side.  Repeat __3__ times per session for 30 secs each. Do _1-2___ sessions per day.  Copyright  VHI. All rights reserved.   Feet Together (Compliant Surface) Head Motion - Eyes  Open    With eyes open, standing on compliant surface: ___pillow_____, feet together, move head slowly: up and down x 10 reps, side to side x 10 reps Do __1-2__ sessions per day.  Copyright  VHI. All rights reserved.

## 2017-04-22 ENCOUNTER — Ambulatory Visit: Payer: Medicare Other | Admitting: Rehabilitative and Restorative Service Providers"

## 2017-04-22 ENCOUNTER — Encounter: Payer: Self-pay | Admitting: Rehabilitative and Restorative Service Providers"

## 2017-04-22 DIAGNOSIS — R2681 Unsteadiness on feet: Secondary | ICD-10-CM

## 2017-04-22 DIAGNOSIS — R2689 Other abnormalities of gait and mobility: Secondary | ICD-10-CM

## 2017-04-22 NOTE — Therapy (Signed)
[facility] [address] [place], [place], [address] Phone: [phone]   Fax:  [phone]  Physical Therapy Treatment  Patient Details  Name: Gabriel [name] MRN: [id number] Date of Birth: 1930/12/27 Referring Provider: Lenor Coffin, MD   Encounter Date: 04/22/2017  PT End of Session - 04/22/17 1456    Visit Number  7    Number of Visits  8    Date for PT Re-Evaluation  05/03/17    Authorization Type  MCR primary/ UHC secondary G code every 10th visit    PT Start Time  1406    PT Stop Time  1450    PT Time Calculation (min)  44 min    Equipment Utilized During Treatment  --    Activity Tolerance  Patient tolerated treatment well    Behavior During Therapy  [facility] for tasks assessed/performed       Past Medical History:  Diagnosis Date  . Gait disorder 02/04/2017  . High cholesterol   . Hypertension     History reviewed. No pertinent surgical history.  There were no vitals filed for this visit.  Subjective Assessment - 04/22/17 1406    Subjective  The patient reports he has been lazy this week with home exercises "there was always some good reason not to do it."  He was at [employer] earlier today for lunch.     Patient Stated Goals  "I'd like to be dancing" patient notes in a joking tone.  His wife reports she would like for him to be safe while walking the dog.  Patient notes "improve mobility".     Currently in Pain?  No/denies         [facility] PT Assessment - 04/22/17 1411      Standardized Balance Assessment   Standardized Balance Assessment  Berg Balance Test      Berg Balance Test   Sit to Stand  Able to stand without using hands and stabilize independently    Standing Unsupported  Able to stand safely 2 minutes    Sitting with Back Unsupported but Feet Supported on Floor or Stool  Able to sit safely and securely 2 minutes    Stand to Sit  Sits safely with minimal use of hands     Transfers  Able to transfer safely, minor use of hands    Standing Unsupported with Eyes Closed  Able to stand 10 seconds safely    Standing Ubsupported with Feet Together  Able to place feet together independently and stand 1 minute safely    From Standing, Reach Forward with Outstretched Arm  Can reach confidently >25 cm (10")    From Standing Position, Pick up Object from Floor  Able to pick up shoe safely and easily    From Standing Position, Turn to Look Behind Over each Shoulder  Turn sideways only but maintains balance    Turn 360 Degrees  Able to turn 360 degrees safely but slowly    Standing Unsupported, Alternately Place Feet on Step/Stool  Able to stand independently and safely and complete 8 steps in 20 seconds    Standing Unsupported, One Foot in Front  Able to plae foot ahead of the other independently and hold 30 seconds    Standing on One Leg  Able to lift leg independently and hold equal to or more than 3 seconds    Total Score  49    Berg comment:  49/56  [facility] PT Treatment/Exercise - 04/22/17 1435      Ambulation/Gait   Ambulation/Gait  Yes    Ambulation/Gait Assistance  6: Modified independent (Device/Increase time)    Ambulation/Gait Assistance Details  PT provided intermittent verbal cues for longer step length left emphasizing L heel strike and knee extension at terminal swing phase of gait    Ambulation Distance (Feet)  1000 Feet    Assistive device  None    Gait Pattern  Poor foot clearance - left    Ambulation Surface  Level;Indoor;Outdoor;Paved;Grass    Gait velocity  3.06 ft/sec    Gait Comments  Comunity gait activities emphasized as functionally, the patient walks 80 lb dog on grass, paved surfaces, etc.  The patient did not have any loss of balance with grassy surfaces, rubber mulch, curbs, or paved/level surfaces.  He performed mod indep.       Neuro Re-ed    Neuro Re-ed Details   Berg balance score 49/56.      Exercises    Exercises  Other Exercises    Other Exercises   L hamstring stretch sitting position, quad set with L LE in extended position x 10 reps with tactile cues for quad contraction, Long arc quad with noting hip flexion instead of knee extension-- therefore worked on LAQ through portion of end range of movement with tactile and verbal cues.                  PT Long Term Goals - 04/22/17 1441      PT LONG TERM GOAL #1   Title  The patient will be indep with HEP for high level balance and flexibility.    Time  6    Period  Weeks    Target Date  05/03/17      PT LONG TERM GOAL #2   Title  The patient will improve gait speed from 2.31 ft/sec to > or equal to 2.7 ft/sec to demo transition to "full community ambulator" classification of gait.    Baseline  3.06 ft/sec on 04/22/17    Time  6    Period  Weeks    Status  Achieved      PT LONG TERM GOAL #3   Title  The patient will improve Berg from 48/56 to > or equal to 52/56 to demo improving high level balance control.    Baseline  49/56 on 04/22/17    Time  6    Period  Weeks      PT LONG TERM GOAL #4   Title  The patient will negotiate 1000' community surfaces independently without loss of balance including curbs, ramps, grass for improved confidence with mobility.    Time  6    Period  Weeks    Status  Achieved            Plan - 04/22/17 1501    Clinical Impression Statement  The patient met 2 LTGs.  PT emphasizing transition to community wellness for long term strengthening and to maintain gains in PT.  He has shown significant progress with gait speed and unlevel/ community surface negotiation.  PT to continue working towards 2 LTGs.     PT Treatment/Interventions  ADLs/Self Care Home Management;Balance training;Neuromuscular re-education;Gait training;Stair training;Therapeutic activities;Therapeutic exercise;Functional mobility training;Patient/family education    PT Next Visit Plan  check LTGs, balance, hip extensor  strengthening    Consulted and Agree with Plan of Care  Patient  Patient will benefit from skilled therapeutic intervention in order to improve the following deficits and impairments:  Abnormal gait, Decreased balance, Postural dysfunction, Impaired flexibility  Visit Diagnosis: Other abnormalities of gait and mobility  Unsteadiness on feet     Problem List Patient Active Problem List   Diagnosis Date Noted  . Gait disorder 02/04/2017    Yakima Kreitzer, PT 04/22/2017, 3:02 PM  [facility] [address] [place], [place], [address] Phone: [phone]   Fax:  [phone]  Name: LEALAND ELTING MRN: [id number] Date of Birth: 1930/06/20

## 2017-04-26 ENCOUNTER — Encounter: Payer: Medicare Other | Admitting: Rehabilitative and Restorative Service Providers"

## 2017-04-29 ENCOUNTER — Encounter: Payer: Self-pay | Admitting: Rehabilitative and Restorative Service Providers"

## 2017-04-29 ENCOUNTER — Ambulatory Visit: Payer: Medicare Other | Admitting: Rehabilitative and Restorative Service Providers"

## 2017-04-29 ENCOUNTER — Telehealth: Payer: Self-pay

## 2017-04-29 DIAGNOSIS — R2681 Unsteadiness on feet: Secondary | ICD-10-CM

## 2017-04-29 DIAGNOSIS — R2689 Other abnormalities of gait and mobility: Secondary | ICD-10-CM

## 2017-04-29 NOTE — Telephone Encounter (Signed)
Called pt to schedule AWV visit. Pt said he did wellness visit with Dr. Mancel Bale a few weeks ago.    Josepha Pigg, B.A.  Care Guide - [facility]

## 2017-04-29 NOTE — Therapy (Signed)
[facility] [address] [place], [place], [address] Phone: [phone]   Fax:  [phone]  Physical Therapy Treatment and Discharge Summary  Patient Details  Name: Gabriel [name] MRN: [id number] Date of Birth: [birth year] Referring Provider: Lenor Coffin, MD   Encounter Date: 04/29/2017  PT End of Session - 04/29/17 1516    Visit Number  8    Number of Visits  8    Date for PT Re-Evaluation  05/03/17    Authorization Type  MCR primary/ UHC secondary G code every 10th visit    PT Start Time  1404    PT Stop Time  1434    PT Time Calculation (min)  30 min    Activity Tolerance  Patient tolerated treatment well    Behavior During Therapy  [facility] for tasks assessed/performed       Past Medical History:  Diagnosis Date  . Gait disorder 02/04/2017  . High cholesterol   . Hypertension     History reviewed. No pertinent surgical history.  There were no vitals filed for this visit.  Subjective Assessment - 04/29/17 1426    Subjective  The patient is doing classes at [employer] 2-3 days/week.  He is not doing HEP as often.     Patient Stated Goals  "I'd like to be dancing" patient notes in a joking tone.  His wife reports she would like for him to be safe while walking the dog.  Patient notes "improve mobility".     Currently in Pain?  No/denies         [facility] PT Assessment - 04/29/17 1410      Standardized Balance Assessment   Standardized Balance Assessment  Berg Balance Test      Berg Balance Test   Sit to Stand  Able to stand without using hands and stabilize independently    Standing Unsupported  Able to stand safely 2 minutes    Sitting with Back Unsupported but Feet Supported on Floor or Stool  Able to sit safely and securely 2 minutes    Stand to Sit  Sits safely with minimal use of hands    Transfers  Able to transfer safely, minor use of hands    Standing Unsupported with Eyes Closed  Able to  stand 10 seconds safely    Standing Ubsupported with Feet Together  Able to place feet together independently and stand 1 minute safely    From Standing, Reach Forward with Outstretched Arm  Can reach confidently >25 cm (10")    From Standing Position, Pick up Object from Floor  Able to pick up shoe safely and easily    From Standing Position, Turn to Look Behind Over each Shoulder  Looks behind one side only/other side shows less weight shift    Turn 360 Degrees  Able to turn 360 degrees safely one side only in 4 seconds or less    Standing Unsupported, Alternately Place Feet on Step/Stool  Able to stand independently and safely and complete 8 steps in 20 seconds    Standing Unsupported, One Foot in Front  Able to plae foot ahead of the other independently and hold 30 seconds    Standing on One Leg  Able to lift leg independently and hold equal to or more than 3 seconds    Total Score  51    Berg comment:  51/56  [facility] Adult PT Treatment/Exercise - 04/29/17 1509      Ambulation/Gait   Ambulation/Gait  Yes    Ambulation/Gait Assistance  6: Modified independent (Device/Increase time)    Ambulation Distance (Feet)  1000 Feet    Assistive device  None    Gait Pattern  -- dec'd timing left side    Gait Comments  Walked outdoors on paved surfaces to practice inclines/ declines without a device mod indep.  The patient shows slower swing phase of the left side when fatigued with extended ambulation.      Self-Care   Self-Care  Other Self-Care Comments    Other Self-Care Comments   Discussed continuation of home exercises, [employer] community classes and discussed previous recommendation that if not rigorous enough can try [employer].  Patient verbalizes understanding.       Neuro Re-ed    Neuro Re-ed Details   Corner balance activities reviewing all HEP including partial heel/toe, compliant surface standing with eyes closed, compliant surface with head motion,  single leg stance.      Exercises   Exercises  Other Exercises    Other Exercises   Corner balance stretch, heel cord stretch and hamstring stretch.                  PT Long Term Goals - 04/29/17 1410      PT LONG TERM GOAL #1   Title  The patient will be indep with HEP for high level balance and flexibility.    Time  6    Period  Weeks    Status  Achieved      PT LONG TERM GOAL #2   Title  The patient will improve gait speed from 2.31 ft/sec to > or equal to 2.7 ft/sec to demo transition to "full community ambulator" classification of gait.    Baseline  3.06 ft/sec on 04/22/17    Time  6    Period  Weeks    Status  Achieved      PT LONG TERM GOAL #3   Title  The patient will improve Berg from 48/56 to > or equal to 52/56 to demo improving high level balance control.    Baseline  51/56    Time  6    Period  Weeks    Status  Partially Met      PT LONG TERM GOAL #4   Title  The patient will negotiate 1000' community surfaces independently without loss of balance including curbs, ramps, grass for improved confidence with mobility.    Time  6    Period  Weeks    Status  Achieved            Plan - 04/29/17 1512    Clinical Impression Statement  The patient has met 3/4 LTGs and made progress on Berg balance scale.  He demonstrates improved gait speed, posture and negotiation of community surfaces.  With fatigue, he continues with dec'd L foot clearance.  PT discussed continuing HEP and walking program on level surfaces to continue to progress endurance for functional mobility.    PT Treatment/Interventions  ADLs/Self Care Home Management;Balance training;Neuromuscular re-education;Gait training;Stair training;Therapeutic activities;Therapeutic exercise;Functional mobility training;Patient/family education    PT Next Visit Plan  Discharge today    Consulted and Agree with Plan of Care  Patient       Patient will benefit from skilled therapeutic intervention in  order to improve the following deficits and impairments:  Abnormal gait, Decreased balance, Postural  dysfunction, Impaired flexibility  Visit Diagnosis: Other abnormalities of gait and mobility  Unsteadiness on feet  PHYSICAL THERAPY DISCHARGE SUMMARY  Visits from Start of Care: 8  Current functional level related to goals / functional outcomes: See above   Remaining deficits: dec'd left foot clearance with fatigue dec'd high level balance for narrow base of support   Education / Equipment: Home exercise program, participating in community classes at [employer].    Plan: Patient agrees to discharge.  Patient goals were partially met. Patient is being discharged due to meeting the stated rehab goals.  ?????      Thank you for the referral of this patient. Rudell Cobb, MPT   [place], PT 04/29/2017, 3:17 PM  [facility] [address] [place], [place], [address] Phone: [phone]   Fax:  [phone]  Name: Gabriel [name] MRN: [id number] Date of Birth: 1931/01/15

## 2017-05-04 ENCOUNTER — Ambulatory Visit (INDEPENDENT_AMBULATORY_CARE_PROVIDER_SITE_OTHER): Payer: Medicare Other | Admitting: Physician Assistant

## 2017-05-04 ENCOUNTER — Ambulatory Visit (INDEPENDENT_AMBULATORY_CARE_PROVIDER_SITE_OTHER): Payer: Medicare Other

## 2017-05-04 ENCOUNTER — Other Ambulatory Visit: Payer: Self-pay

## 2017-05-04 ENCOUNTER — Encounter: Payer: Self-pay | Admitting: Physician Assistant

## 2017-05-04 VITALS — BP 132/82 | HR 63 | Temp 97.7°F | Resp 16 | Ht 70.08 in | Wt 203.0 lb

## 2017-05-04 DIAGNOSIS — Z8701 Personal history of pneumonia (recurrent): Secondary | ICD-10-CM | POA: Diagnosis not present

## 2017-05-04 DIAGNOSIS — J189 Pneumonia, unspecified organism: Secondary | ICD-10-CM | POA: Diagnosis not present

## 2017-05-04 IMAGING — DX DG CHEST 2V
2 series · 2 of 2 positions shown · non-contrast
Comparison: Chest x-ray of [DATE]

CLINICAL DATA: Follow-up of pneumonia.  Nonsmoker.

EXAM:
CHEST  2 VIEW

[chest pa]
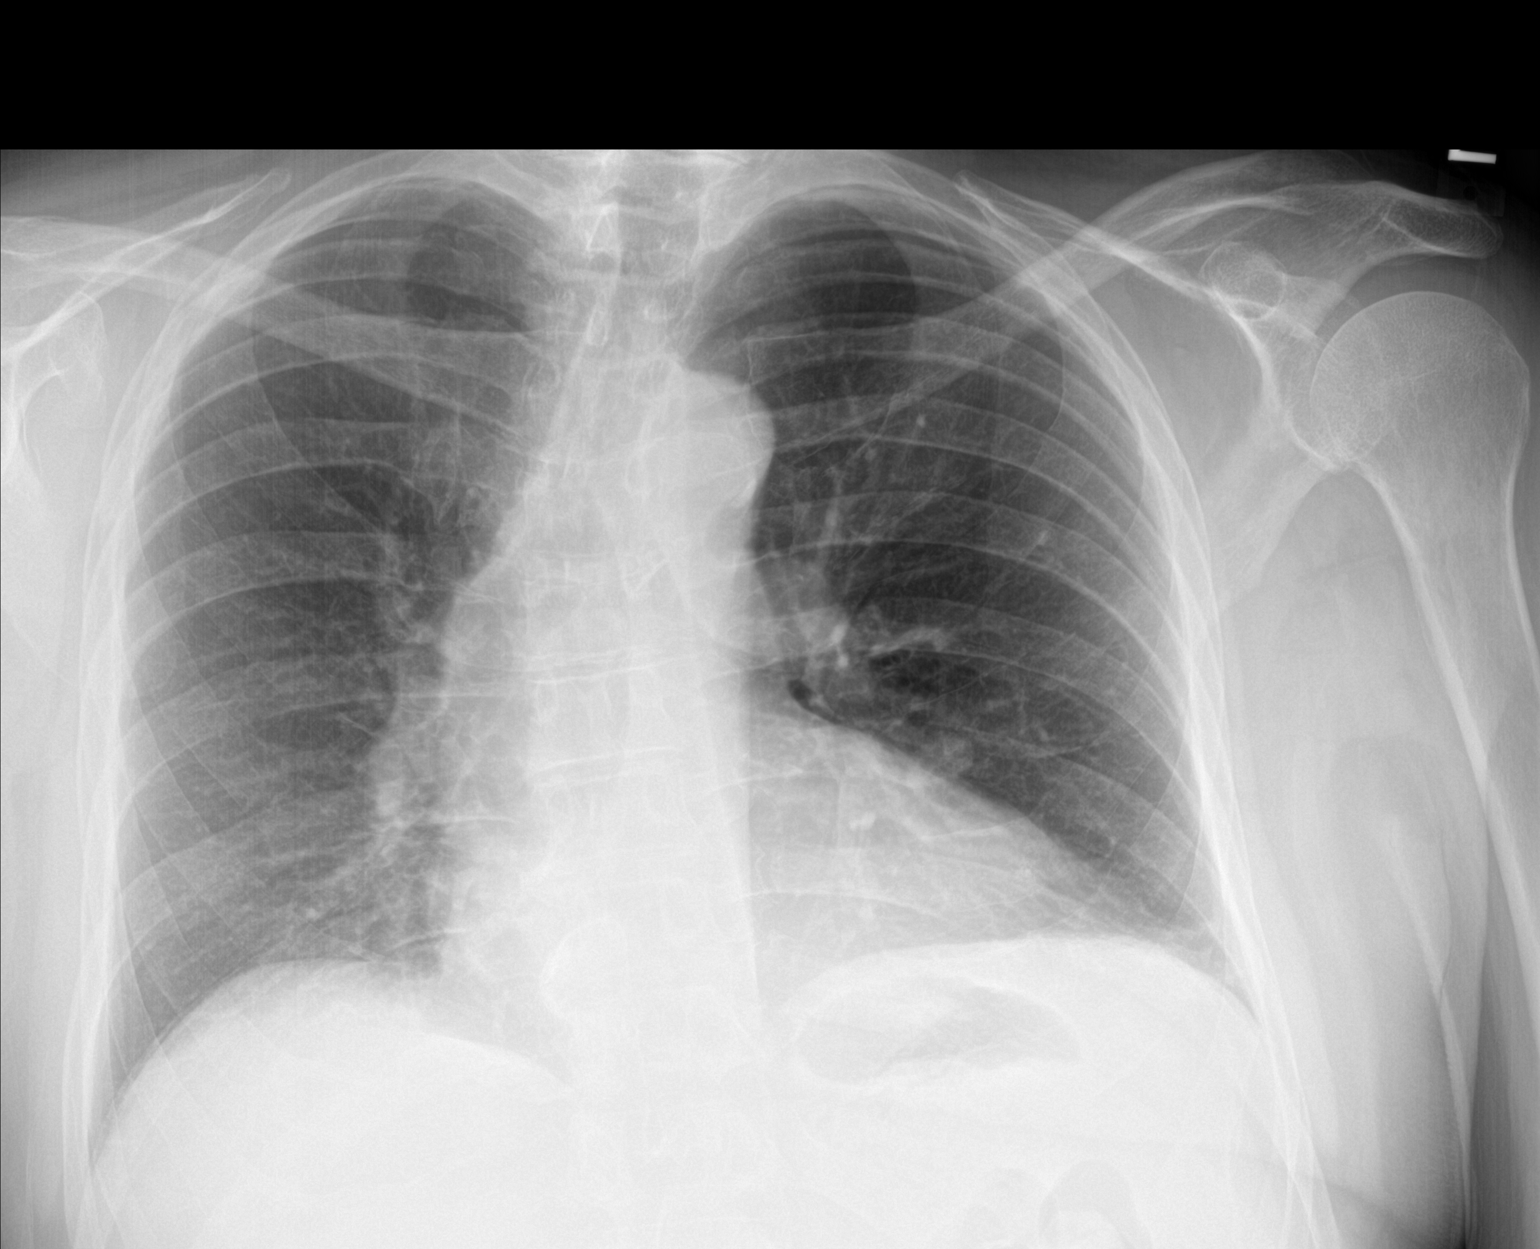

[chest lat]
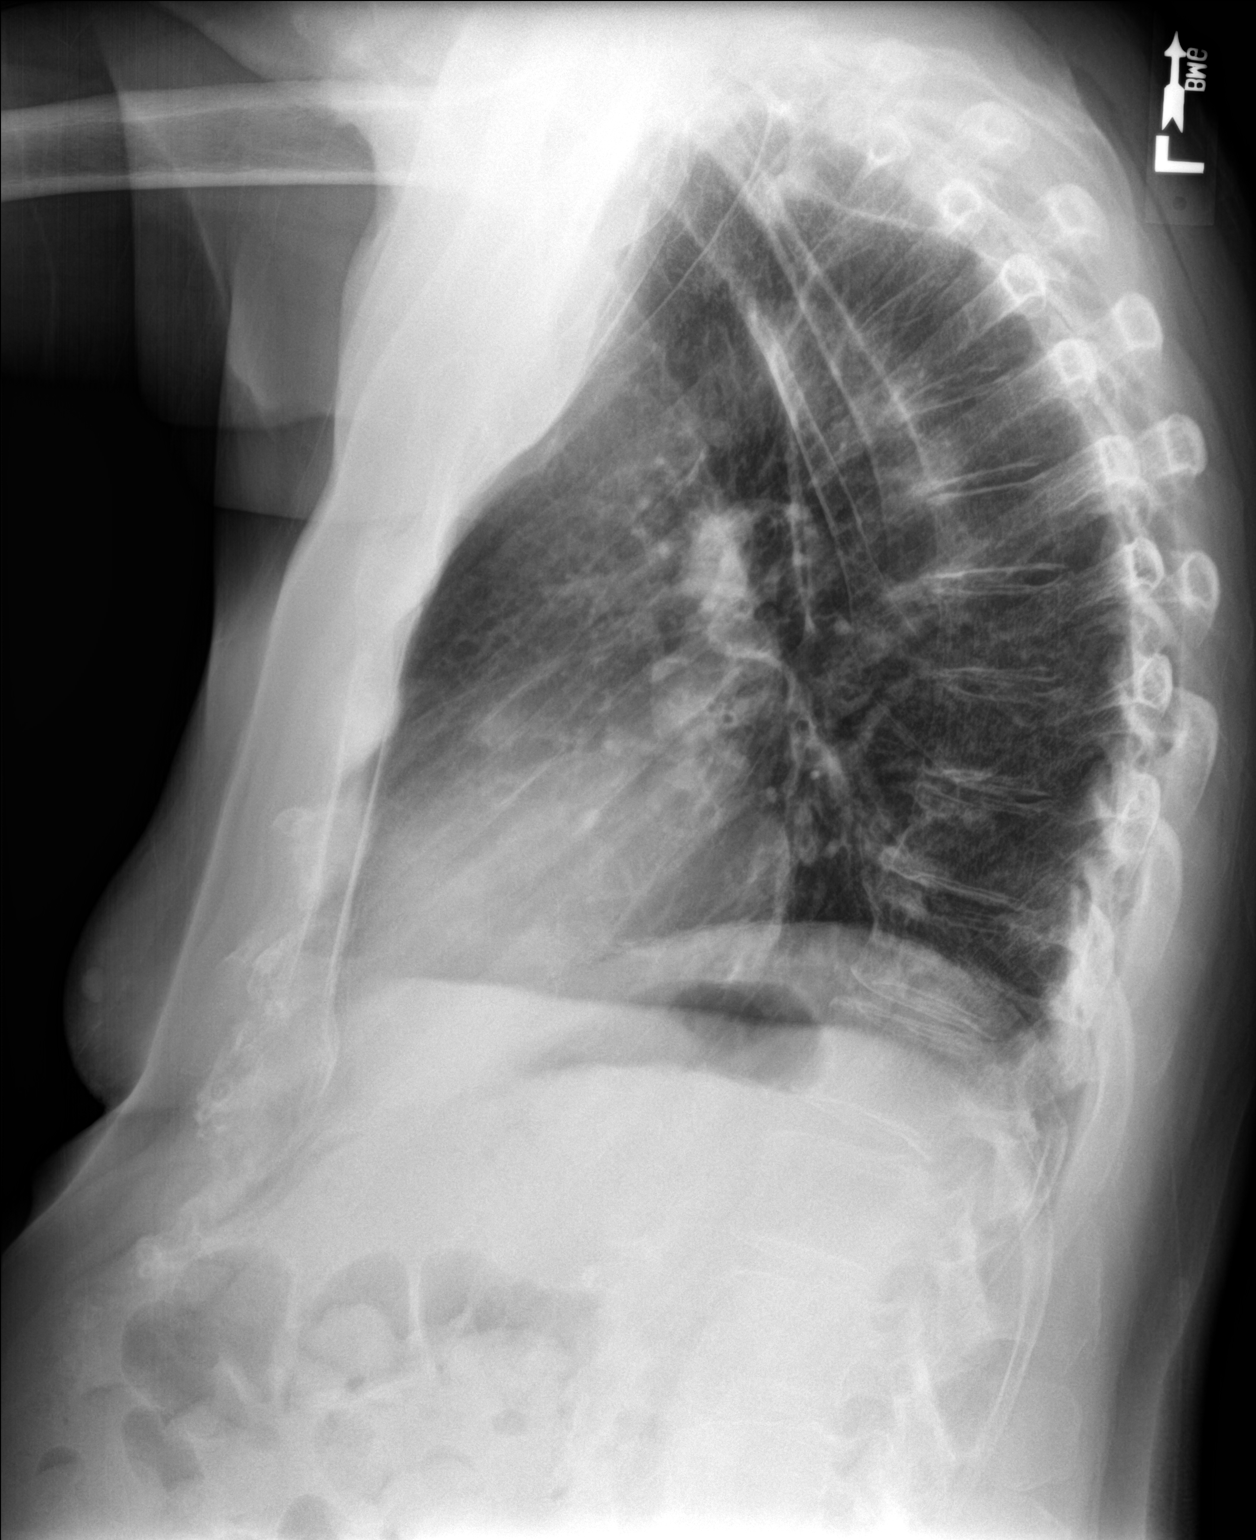

[2 of 2 positions shown; findings below may reference images not displayed]

FINDINGS: The lungs are adequately inflated. There is no focal infiltrate.
There is no pleural effusion. The heart and pulmonary vascularity
are normal. The mediastinum is normal in width. There calcification
in the wall of the aortic arch. There are mild degenerative disc
changes of the thoracic spine.
IMPRESSION: There is no pneumonia nor other acute cardiopulmonary abnormality.

Thoracic aortic atherosclerosis.

## 2017-05-04 NOTE — Patient Instructions (Signed)
     IF you received an x-ray today, you will receive an invoice from [facility]. Please contact [facility] at [phone] with questions or concerns regarding your invoice.   IF you received labwork today, you will receive an invoice from [facility]. Please contact [facility] at [phone] with questions or concerns regarding your invoice.   Our billing staff will not be able to assist you with questions regarding bills from these companies.  You will be contacted with the lab results as soon as they are available. The fastest way to get your results is to activate your My Chart account. Instructions are located on the last page of this paperwork. If you have not heard from us regarding the results in 2 weeks, please contact this office.     

## 2017-05-04 NOTE — Progress Notes (Signed)
   Gabriel [name]  MRN: [id number] DOB: [birth year]  PCP: [name], MD  Subjective:  Pt is an 84 year old male who presents to clinic for f/u pneumonia. He was here 11/05 for cough, diagnosed with pneumonia and asked to f/u to ensure resolution. He came back 12/28 chest x-ray showed partial resolution of right base infiltrate and mild left infrahilar nodular infiltrate. Recommend f/u imaging to ensure resolution. RTC in 1 month.  Today he is feeling much better than he has the past two month. No longer feeling fatigued as easy and is not coughing as much. Denies night sweats, fatigue, cough.   Review of Systems  Constitutional: Positive for fatigue. Negative for chills, diaphoresis and fever.  HENT: Negative for congestion, sinus pressure and sinus pain.   Respiratory: Positive for cough. Negative for chest tightness, shortness of breath and wheezing.   Cardiovascular: Negative for chest pain and palpitations.    Patient Active Problem List   Diagnosis Date Noted  . Gait disorder 02/04/2017    Current Outpatient Medications on File Prior to Visit  Medication Sig Dispense Refill  . aspirin 81 MG tablet Take 81 mg by mouth daily.    Marland Kitchen atorvastatin (LIPITOR) 20 MG tablet     . benzonatate (TESSALON) 100 MG capsule Take 1-2 capsules (100-200 mg total) 3 (three) times daily as needed by mouth for cough. 40 capsule 0  . lisinopril (PRINIVIL,ZESTRIL) 10 MG tablet Take 10 mg by mouth daily.  4  . tadalafil (CIALIS) 20 MG tablet      No current facility-administered medications on file prior to visit.     No Known Allergies   Objective:  BP 132/82   Pulse 63   Temp 97.7 F (36.5 C) (Oral)   Resp 16   Ht 5' 10.08" (1.78 m)   Wt 203 lb (92.1 kg)   SpO2 98%   BMI 29.06 kg/m   Physical Exam  Constitutional: He is oriented to person, place, and time and well-developed, well-nourished, and in no distress. No distress.  Cardiovascular: Normal rate, regular rhythm and normal  heart sounds.  Pulmonary/Chest: Effort normal and breath sounds normal. He has no rales.  Neurological: He is alert and oriented to person, place, and time. GCS score is 15.  Skin: Skin is warm and dry.  Psychiatric: Mood, memory, affect and judgment normal.  Vitals reviewed.   Dg Chest 2 View  Result Date: 05/04/2017 CLINICAL DATA:  Follow-up of pneumonia.  Nonsmoker. EXAM: CHEST  2 VIEW COMPARISON:  Chest x-ray of April 02, 2017 FINDINGS: The lungs are adequately inflated. There is no focal infiltrate. There is no pleural effusion. The heart and pulmonary vascularity are normal. The mediastinum is normal in width. There calcification in the wall of the aortic arch. There are mild degenerative disc changes of the thoracic spine. IMPRESSION: There is no pneumonia nor other acute cardiopulmonary abnormality. Thoracic aortic atherosclerosis. Electronically Signed   By: David  Martinique M.D.   On: 05/04/2017 14:42    Assessment and Plan :  1. History of pneumonia - DG Chest 2 View; Future - Pt is here for f/u PNU from 02/08/2017. Feeling well today. Clinically has improved.  Chest x-ray is negative. PNU resolved. RTC PRN.   Mercer Pod, PA-C  [facility] 05/04/2017 2:23 PM

## 2017-06-23 ENCOUNTER — Encounter: Payer: Self-pay | Admitting: Neurology

## 2017-06-23 ENCOUNTER — Encounter (INDEPENDENT_AMBULATORY_CARE_PROVIDER_SITE_OTHER): Payer: Self-pay

## 2017-06-23 ENCOUNTER — Ambulatory Visit (INDEPENDENT_AMBULATORY_CARE_PROVIDER_SITE_OTHER): Payer: Medicare Other | Admitting: Neurology

## 2017-06-23 VITALS — BP 140/75 | HR 52 | Ht 70.0 in | Wt 205.0 lb

## 2017-06-23 DIAGNOSIS — R269 Unspecified abnormalities of gait and mobility: Secondary | ICD-10-CM

## 2017-06-23 NOTE — Progress Notes (Signed)
Reason for visit: Gait disorder  Gabriel [name] is an 84 y.o. male  History of present illness:  Gabriel [name] is an 84 year old right-handed white male with a history of a gait disorder.  The patient has a wide-based gait, he has been through physical therapy which was of some benefit.  The patient has been more deliberate about walking, he has not had any falls.  He does not use a cane or walker for ambulation.  He has undergone MRI of the brain that showed a moderate level of small vessel disease, diffuse cortical and cerebellar atrophy was noted.  The patient has had blood work that included a vitamin B12 level that was unremarkable.  He has undergone EMG and nerve conduction study that did not show evidence of a peripheral neuropathy.  The patient is engaging in physical activity to help his balance.  He is taking exercise classes through [employer].  He returns for an evaluation.   Past Medical History:  Diagnosis Date  . Gait disorder 02/04/2017  . High cholesterol   . Hypertension     History reviewed. No pertinent surgical history.  Family History  Problem Relation Age of Onset  . Heart attack Mother   . Heart disease Father   . Cancer - Prostate Father   . Cancer Brother        renal    Social history:  reports that  has never smoked. he has never used smokeless tobacco. He reports that he drinks alcohol. He reports that he does not use drugs.   No Known Allergies  Medications:  Prior to Admission medications   Medication Sig Start Date End Date Taking? Authorizing Provider  aspirin 81 MG tablet Take 81 mg by mouth daily.   Yes [provider]  atorvastatin (LIPITOR) 20 MG tablet  04/26/17  Yes [provider]  lisinopril (PRINIVIL,ZESTRIL) 10 MG tablet Take 10 mg by mouth daily. 11/20/16  Yes [provider]  tadalafil (CIALIS) 20 MG tablet  04/26/17   [provider]    ROS:  Out of a complete 14 system review of symptoms, the  patient complains only of the following symptoms, and all other reviewed systems are negative.  Gait disorder  Blood pressure 140/75, pulse (!) 52, height 5\' 10"  (1.778 m), weight 205 lb (93 kg).  Physical Exam  General: The patient is alert and cooperative at the time of the examination.  Skin: No significant peripheral edema is noted.   Neurologic Exam  Mental status: The patient is alert and oriented x 3 at the time of the examination. The patient has apparent normal recent and remote memory, with an apparently normal attention span and concentration ability.   Cranial nerves: Facial symmetry is present. Speech is normal, no aphasia or dysarthria is noted. Extraocular movements are full. Visual fields are full.  Motor: The patient has good strength in all 4 extremities.  Sensory examination: Soft touch sensation is symmetric on the face, arms, and legs.  Coordination: The patient has good finger-nose-finger and heel-to-shin bilaterally.  Gait and station: The patient has a slightly wide-based gait, with walking he has good bilateral arm swing. Tandem gait is normal. Romberg is negative. No drift is seen.  Reflexes: Deep tendon reflexes are symmetric.   Assessment/Plan:  1.  Gait disorder  The patient has a nonspecific gait disorder.  No specific etiology has been found.  He does not appear to have features of parkinsonism, his gait is  wide-based, and slow.  He will be followed over time for this issue.  He will check back in about 6 months.  He is to continue exercises.  Jill Alexanders MD 06/23/2017 11:48 AM  [facility] [address] [place],  [address]  Phone [phone] Fax [phone]

## 2017-08-25 ENCOUNTER — Other Ambulatory Visit (HOSPITAL_COMMUNITY): Payer: Self-pay | Admitting: Urology

## 2017-08-25 DIAGNOSIS — D49512 Neoplasm of unspecified behavior of left kidney: Secondary | ICD-10-CM

## 2017-08-25 DIAGNOSIS — D49511 Neoplasm of unspecified behavior of right kidney: Secondary | ICD-10-CM

## 2017-09-06 ENCOUNTER — Other Ambulatory Visit: Payer: Self-pay | Admitting: Radiology

## 2017-09-07 ENCOUNTER — Ambulatory Visit (HOSPITAL_COMMUNITY)
Admission: RE | Admit: 2017-09-07 | Discharge: 2017-09-07 | Disposition: A | Discharge status: Home / Self Care | Payer: Medicare Other | Source: Ambulatory Visit | Attending: Urology | Admitting: Urology

## 2017-09-07 ENCOUNTER — Encounter (HOSPITAL_COMMUNITY): Payer: Self-pay

## 2017-09-07 DIAGNOSIS — Z809 Family history of malignant neoplasm, unspecified: Secondary | ICD-10-CM | POA: Insufficient documentation

## 2017-09-07 DIAGNOSIS — N200 Calculus of kidney: Secondary | ICD-10-CM | POA: Insufficient documentation

## 2017-09-07 DIAGNOSIS — Z8042 Family history of malignant neoplasm of prostate: Secondary | ICD-10-CM | POA: Insufficient documentation

## 2017-09-07 DIAGNOSIS — Z79899 Other long term (current) drug therapy: Secondary | ICD-10-CM | POA: Diagnosis not present

## 2017-09-07 DIAGNOSIS — R319 Hematuria, unspecified: Secondary | ICD-10-CM | POA: Diagnosis not present

## 2017-09-07 DIAGNOSIS — K8020 Calculus of gallbladder without cholecystitis without obstruction: Secondary | ICD-10-CM | POA: Diagnosis not present

## 2017-09-07 DIAGNOSIS — N210 Calculus in bladder: Secondary | ICD-10-CM | POA: Insufficient documentation

## 2017-09-07 DIAGNOSIS — I10 Essential (primary) hypertension: Secondary | ICD-10-CM | POA: Insufficient documentation

## 2017-09-07 DIAGNOSIS — I700 Atherosclerosis of aorta: Secondary | ICD-10-CM | POA: Diagnosis not present

## 2017-09-07 DIAGNOSIS — D49512 Neoplasm of unspecified behavior of left kidney: Secondary | ICD-10-CM

## 2017-09-07 DIAGNOSIS — D3002 Benign neoplasm of left kidney: Secondary | ICD-10-CM | POA: Diagnosis not present

## 2017-09-07 DIAGNOSIS — D49511 Neoplasm of unspecified behavior of right kidney: Secondary | ICD-10-CM

## 2017-09-07 DIAGNOSIS — E7800 Pure hypercholesterolemia, unspecified: Secondary | ICD-10-CM | POA: Insufficient documentation

## 2017-09-07 DIAGNOSIS — Z7982 Long term (current) use of aspirin: Secondary | ICD-10-CM | POA: Insufficient documentation

## 2017-09-07 DIAGNOSIS — N289 Disorder of kidney and ureter, unspecified: Secondary | ICD-10-CM | POA: Diagnosis present

## 2017-09-07 LAB — CBC
HEMATOCRIT: 37.5 % — AB (ref 39.0–52.0)
HEMOGLOBIN: 11.9 g/dL — AB (ref 13.0–17.0)
MCH: 28 pg (ref 26.0–34.0)
MCHC: 31.7 g/dL (ref 30.0–36.0)
MCV: 88.2 fL (ref 78.0–100.0)
Platelets: 205 10*3/uL (ref 150–400)
RBC: 4.25 MIL/uL (ref 4.22–5.81)
RDW: 13.8 % (ref 11.5–15.5)
WBC: 8.7 10*3/uL (ref 4.0–10.5)

## 2017-09-07 LAB — PROTIME-INR
INR: 1.01
Prothrombin Time: 13.2 seconds (ref 11.4–15.2)

## 2017-09-07 MED ORDER — FENTANYL CITRATE (PF) 100 MCG/2ML IJ SOLN
INTRAMUSCULAR | Status: AC
  Filled 2017-09-07: qty 2

## 2017-09-07 MED ORDER — SODIUM CHLORIDE 0.9 % IV SOLN
INTRAVENOUS | Status: AC | PRN
  Administered 2017-09-07: 10 mL/h via INTRAVENOUS

## 2017-09-07 MED ORDER — GELATIN ABSORBABLE 12-7 MM EX MISC
CUTANEOUS | Status: AC
  Filled 2017-09-07: qty 1

## 2017-09-07 MED ORDER — LIDOCAINE HCL (PF) 1 % IJ SOLN
INTRAMUSCULAR | Status: AC
  Filled 2017-09-07: qty 30

## 2017-09-07 MED ORDER — FENTANYL CITRATE (PF) 100 MCG/2ML IJ SOLN
INTRAMUSCULAR | Status: AC | PRN
  Administered 2017-09-07: 50 ug via INTRAVENOUS

## 2017-09-07 MED ORDER — MIDAZOLAM HCL 2 MG/2ML IJ SOLN
INTRAMUSCULAR | Status: AC
  Filled 2017-09-07: qty 2

## 2017-09-07 MED ORDER — SODIUM CHLORIDE 0.9 % IV SOLN: INTRAVENOUS | Status: DC

## 2017-09-07 MED ORDER — MIDAZOLAM HCL 2 MG/2ML IJ SOLN
INTRAMUSCULAR | Status: AC | PRN
  Administered 2017-09-07: 1 mg via INTRAVENOUS

## 2017-09-07 NOTE — H&P (Signed)
Chief Complaint: Patient was seen in consultation today for renal lesion biopsy at the request of [facility]  Referring Physician(s): Kathie Rhodes  Supervising Physician: Daryll Brod  Patient Status: [facility] - Out-pt  History of Present Illness: Gabriel [name] is a 84 y.o. male   Noticed off and on hematuria few months ago Was seen by Dr Karsten Ro CT 08/2017: IMPRESSION: 1. Multiple bilateral solid enhancing kidney lesions are identified worrisome for renal cell carcinomas. 2. Punctate right renal calculi and several small bladder calculi noted. 3. Aortic atherosclerosis. Aortic Atherosclerosis (ICD10-I70.0). 4. Gallstones.  Request made for biopsy for type of RCC Will see Dr Alinda Money after bx result   Past Medical History:  Diagnosis Date  . Gait disorder 02/04/2017  . High cholesterol   . Hypertension     History reviewed. No pertinent surgical history.  Allergies: Patient has no known allergies.  Medications: Prior to Admission medications   Medication Sig Start Date End Date Taking? Authorizing Provider  aspirin 81 MG tablet Take 81 mg by mouth daily.   Yes [provider]  aspirin-acetaminophen-caffeine (EXCEDRIN MIGRAINE) [phone] MG tablet Take 2 tablets by mouth daily as needed for headache.   Yes [provider]  atorvastatin (LIPITOR) 20 MG tablet Take 20 mg by mouth daily.  04/26/17  Yes [provider]  lisinopril (PRINIVIL,ZESTRIL) 10 MG tablet Take 10 mg by mouth daily. 11/20/16  Yes [provider]     Family History  Problem Relation Age of Onset  . Heart attack Mother   . Heart disease Father   . Cancer - Prostate Father   . Cancer Brother        renal    Social History   Socioeconomic History  . Marital status: Married    Spouse name: Not on file  . Number of children: 2  . Years of education: 30  . Highest education level: Not on file  Occupational History  . Occupation: Hotel manager    . Financial resource strain: Not on file  . Food insecurity:    Worry: Not on file    Inability: Not on file  . Transportation needs:    Medical: Not on file    Non-medical: Not on file  Tobacco Use  . Smoking status: Never Smoker  . Smokeless tobacco: Never Used  Substance and Sexual Activity  . Alcohol use: Yes    Comment: 1 beer per week  . Drug use: No  . Sexual activity: Not on file  Lifestyle  . Physical activity:    Days per week: Not on file    Minutes per session: Not on file  . Stress: Not on file  Relationships  . Social connections:    Talks on phone: Not on file    Gets together: Not on file    Attends religious service: Not on file    Active member of club or organization: Not on file    Attends meetings of clubs or organizations: Not on file    Relationship status: Not on file  Other Topics Concern  . Not on file  Social History Narrative   Lives with wife   Caffeine use: rare   Right handed     Review of Systems: A 12 point ROS discussed and pertinent positives are indicated in the HPI above.  All other systems are negative.  Review of Systems  Constitutional: Negative for activity change, fatigue and fever.  Respiratory: Negative for cough and shortness of  breath.   Gastrointestinal: Negative for abdominal pain.  Genitourinary: Positive for hematuria.  Psychiatric/Behavioral: Negative for behavioral problems and confusion.    Vital Signs: BP (!) 181/85   Pulse (!) 54   Temp 97.6 F (36.4 C) (Oral)   Resp 16   Ht 5\' 10"  (1.778 m)   Wt 200 lb (90.7 kg)   SpO2 99%   BMI 28.70 kg/m   Physical Exam  Constitutional: He is oriented to person, place, and time.  Cardiovascular: Normal rate, regular rhythm and normal heart sounds.  Pulmonary/Chest: Effort normal and breath sounds normal.  Abdominal: Soft. Bowel sounds are normal.  Musculoskeletal: Normal range of motion.  Neurological: He is alert and oriented to person, place, and time.   Skin: Skin is warm and dry.  Psychiatric: He has a normal mood and affect. His behavior is normal. Judgment and thought content normal.  Nursing note and vitals reviewed.   Imaging: No results found.  Labs:  CBC: No results for input(s): WBC, HGB, HCT, PLT in the last 8760 hours.  COAGS: No results for input(s): INR, APTT in the last 8760 hours.  BMP: No results for input(s): NA, K, CL, CO2, GLUCOSE, BUN, CALCIUM, CREATININE, GFRNONAA, GFRAA in the last 8760 hours.  Invalid input(s): CMP  LIVER FUNCTION TESTS: No results for input(s): BILITOT, AST, ALT, ALKPHOS, PROT, ALBUMIN in the last 8760 hours.  TUMOR MARKERS: No results for input(s): AFPTM, CEA, CA199, CHROMGRNA in the last 8760 hours.  Assessment and Plan:  Hematuria B renal lesions per CT Now for biopsy of lesion of Rad choice Risks and benefits discussed with the patient including, but not limited to bleeding, infection, damage to adjacent structures or low yield requiring additional tests.  All of the patient's questions were answered, patient is agreeable to proceed. Consent signed and in chart.   Thank you for this interesting consult.  I greatly enjoyed meeting Gabriel [name] and look forward to participating in their care.  A copy of this report was sent to the requesting provider on this date.  Electronically Signed: Lavonia Drafts, PA-C 09/07/2017, 7:28 AM   I spent a total of  30 Minutes   in face to face in clinical consultation, greater than 50% of which was counseling/coordinating care for renal lesion bx

## 2017-09-07 NOTE — Discharge Instructions (Addendum)
Percutaneous Kidney Biopsy, Care After This sheet gives you information about how to care for yourself after your procedure. Your health care provider may also give you more specific instructions. If you have problems or questions, contact your health care provider. What can I expect after the procedure? After the procedure, it is common to have: Pain or soreness near the area where the needle went through your skin (biopsy site). Bright pink or cloudy urine for 24 hours after the procedure.  Follow these instructions at home: Activity Return to your normal activities as told by your health care provider. Ask your health care provider what activities are safe for you. Do not drive for 24 hours if you were given a medicine to help you relax (sedative). Do not lift anything that is heavier than 10 lb (4.5 kg) until your health care provider tells you that it is safe. Avoid activities that take a lot of effort (are strenuous) until your health care provider approves. Most people will have to wait 2 weeks before returning to activities such as exercise or sexual intercourse. General instructions Take over-the-counter and prescription medicines only as told by your health care provider. You may eat and drink after your procedure. Follow instructions from your health care provider about eating or drinking restrictions. Check your biopsy site every day for signs of infection. Check for: More redness, swelling, or pain. More fluid or blood. Warmth. Pus or a bad smell. Keep all follow-up visits as told by your health care provider. This is important. Contact a health care provider if: You have more redness, swelling, or pain around your biopsy site. You have more fluid or blood coming from your biopsy site. Your biopsy site feels warm to the touch. You have pus or a bad smell coming from your biopsy site. You have blood in your urine more than 24 hours after your procedure. Get help right away  if: You have dark red or brown urine. You have a fever. You are unable to urinate. You feel burning when you urinate. You feel faint. You have severe pain in your abdomen or side. This information is not intended to replace advice given to you by your health care provider. Make sure you discuss any questions you have with your health care provider. Document Released: 11/23/2012 Document Revised: 01/03/2016 Document Reviewed: 01/03/2016 Elsevier Interactive Patient Education  [employer].

## 2017-09-07 NOTE — Procedures (Signed)
Renal masses  S/p [place] LEFT RENAL MASS CORE BX  No comp Stable EBL MIN PATH PENDING FULL REPORT IN PACS

## 2017-10-03 IMAGING — US US BIOPSY
1 series · 13 of 13 positions shown · non-contrast
Comparison: none

INDICATION: Bilateral renal cell carcinomas, biopsy to assess for nuclear grade

[Series 1: us biopsy · 0.19mm/px · 13 of 13 slices shown]
[im 1/13]
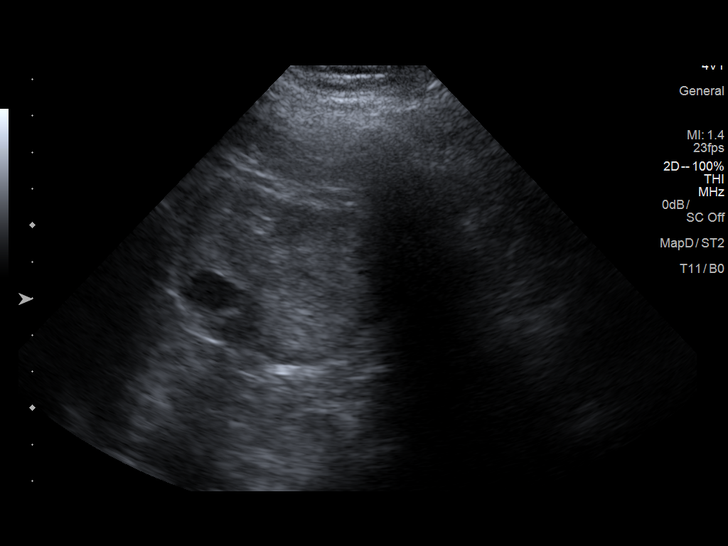
[im 2/13]
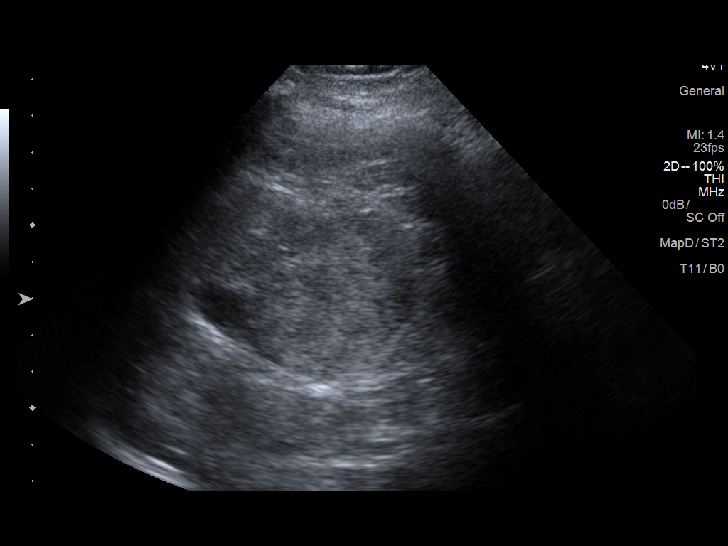
[im 3/13]
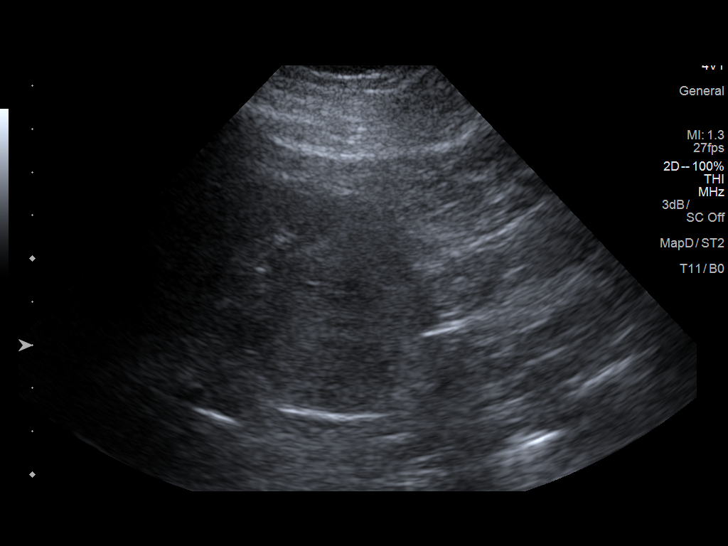
[im 4/13]
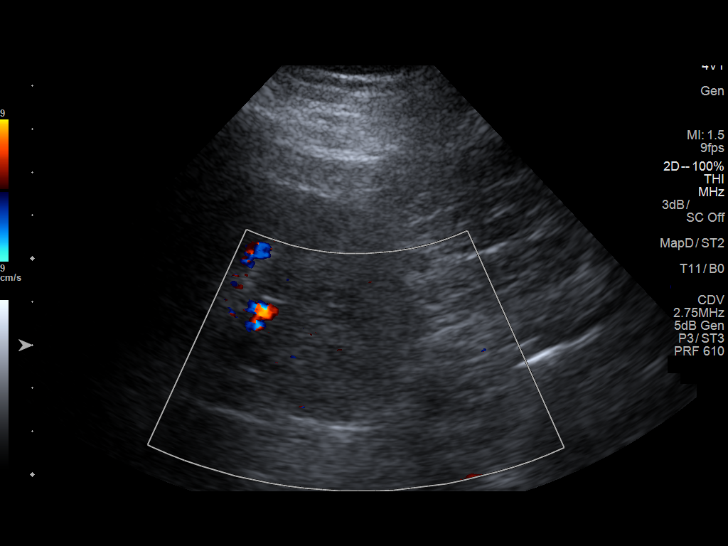
[im 5/13]
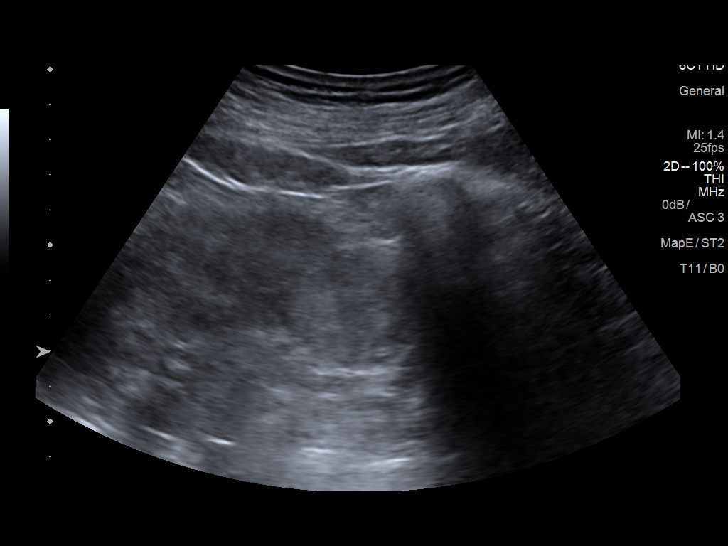
[im 6/13]
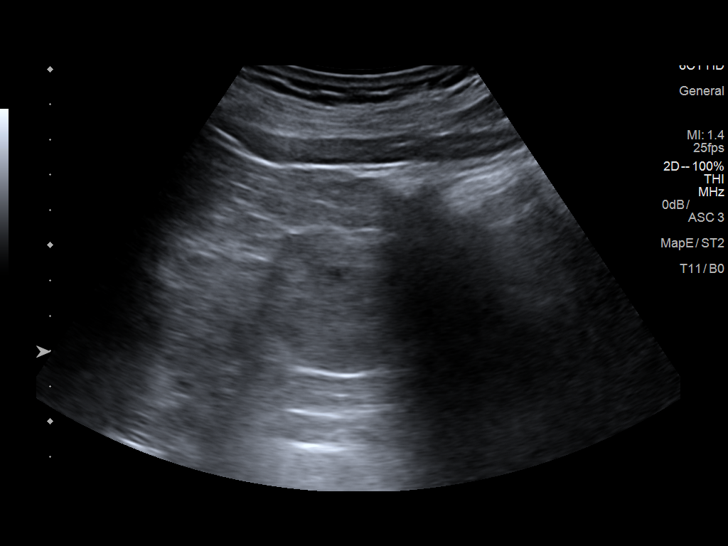
[im 7/13]
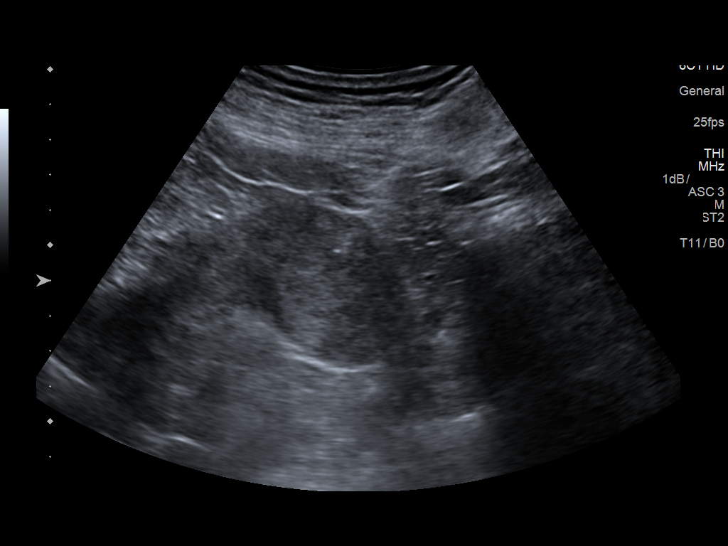
[im 8/13]
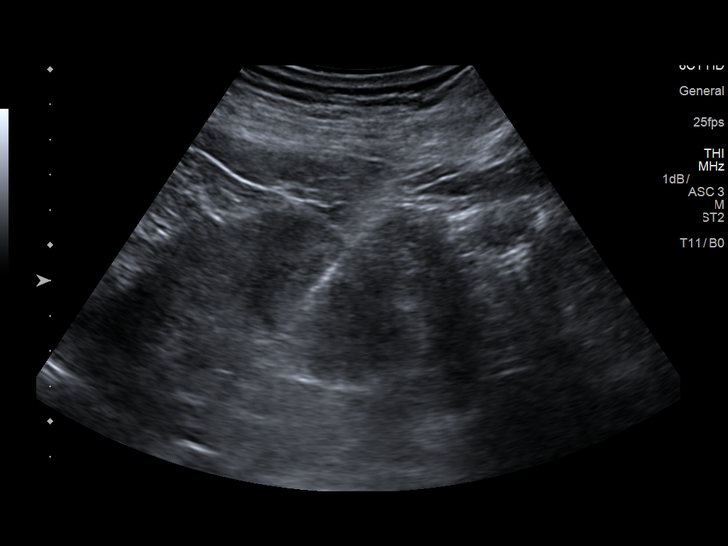
[im 9/13]
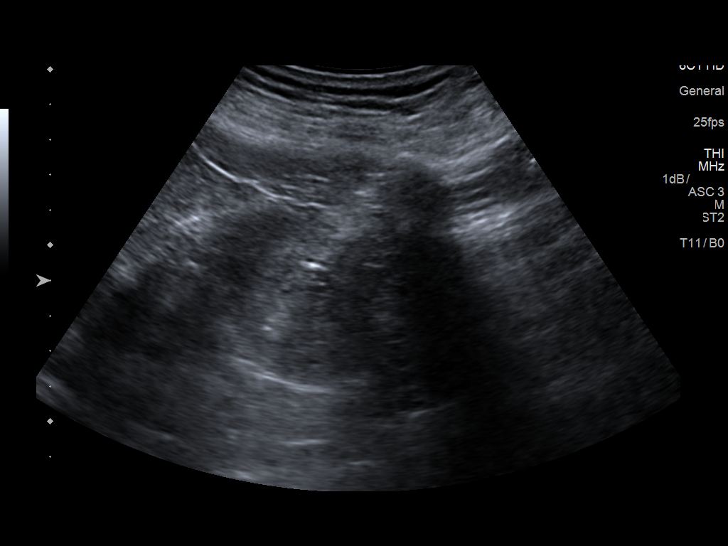
[im 10/13]
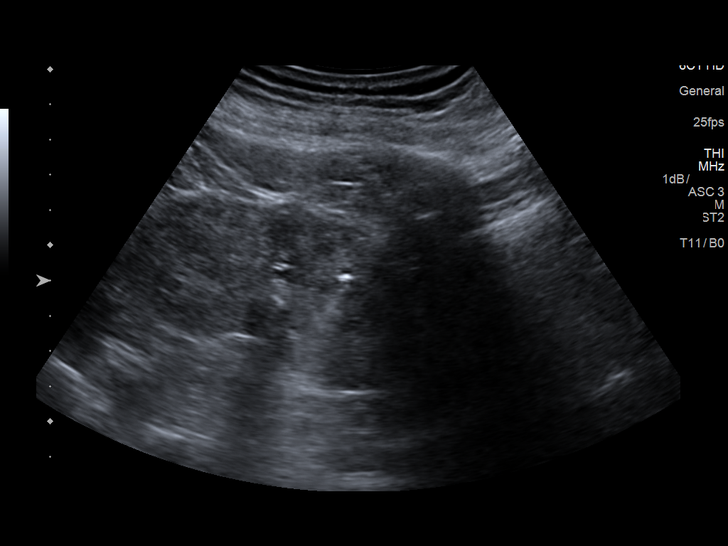
[im 11/13]
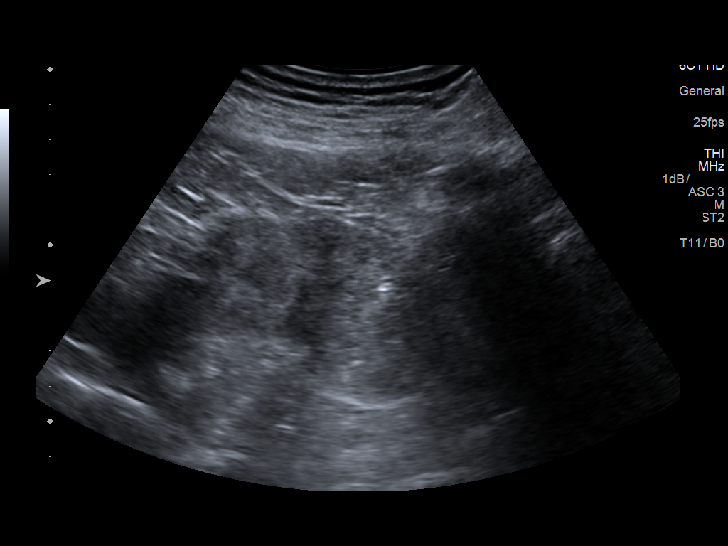
[im 12/13]
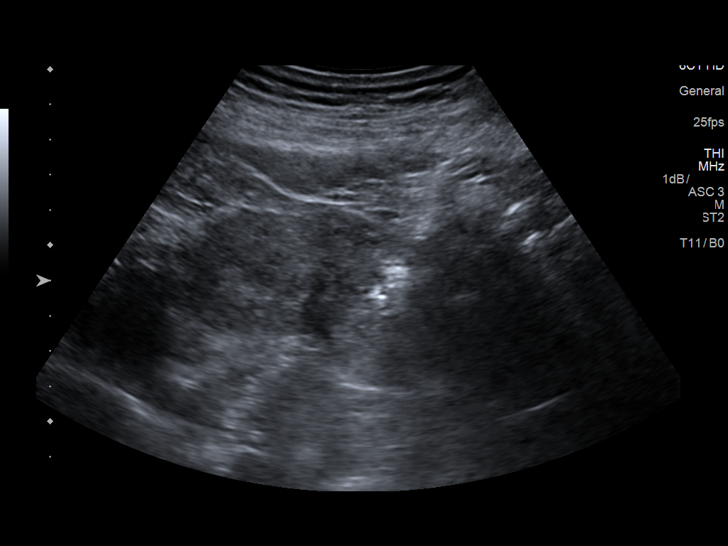
[im 13/13]
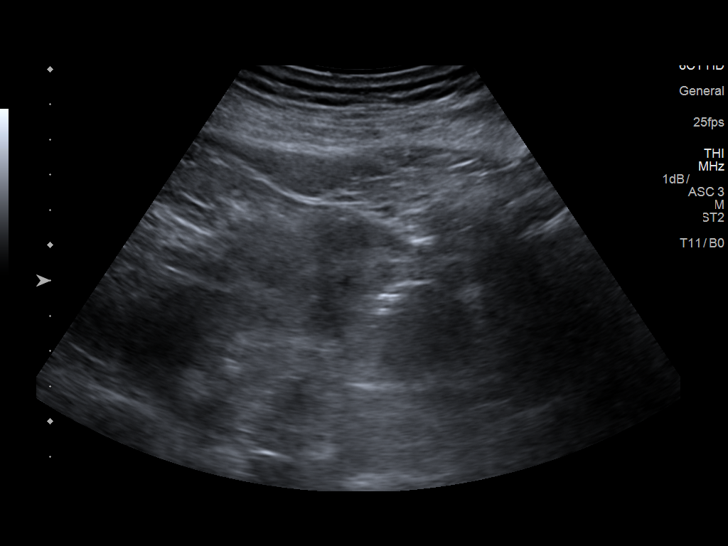

[13 of 13 positions shown; findings below may reference images not displayed]

EXAM:
Ultrasound left renal mass core biopsy

MEDICATIONS:
1% lidocaine local

ANESTHESIA/SEDATION:
Moderate (conscious) sedation was employed during this procedure. A
total of Versed 1.0 mg and Fentanyl 50 mcg was administered
intravenously.

Moderate Sedation Time: 14 minutes. The patient's level of
consciousness and vital signs were monitored continuously by
radiology nursing throughout the procedure under my direct
supervision.

FLUOROSCOPY TIME:  Fluoroscopy Time: None.

COMPLICATIONS:
None immediate.

PROCEDURE:
Informed written consent was obtained from the patient after a
thorough discussion of the procedural risks, benefits and
alternatives. All questions were addressed. Maximal Sterile Barrier
Technique was utilized including caps, mask, sterile gowns, sterile
gloves, sterile drape, hand hygiene and skin antiseptic. A timeout
was performed prior to the initiation of the procedure.

Previous imaging reviewed. Preliminary ultrasound performed. The
lateral left renal 3.9 cm mass was localized by ultrasound.
Overlying skin marked.

Under sterile conditions and local anesthesia, a 17 gauge 11.8 cm
access needle was advanced from a left flank approach to the
echogenic solid left renal mass. Needle position confirmed with
ultrasound. Images obtained for documentation. 18 gauge core
biopsies obtained. Samples placed in formalin. Needle tract occluded
with Gel-Foam. Postprocedure imaging demonstrates no hemorrhage or
hematoma. Patient tolerated biopsy well.
IMPRESSION: Successful ultrasound left renal mass 18 gauge core biopsy

## 2017-12-28 ENCOUNTER — Ambulatory Visit (INDEPENDENT_AMBULATORY_CARE_PROVIDER_SITE_OTHER): Payer: Medicare Other | Admitting: Neurology

## 2017-12-28 ENCOUNTER — Encounter: Payer: Self-pay | Admitting: Neurology

## 2017-12-28 VITALS — BP 143/77 | HR 71 | Ht 70.0 in | Wt 201.5 lb

## 2017-12-28 DIAGNOSIS — R269 Unspecified abnormalities of gait and mobility: Secondary | ICD-10-CM

## 2017-12-28 NOTE — Progress Notes (Signed)
Reason for visit: Gait disorder  Gabriel [name] is an 84 y.o. male  History of present illness:  Gabriel [name] is an 84 year old right-handed white male with a history of a chronic gait disorder.  The patient has undergone MRI of the brain that shows extensive cortical and cerebellar atrophy.  No definite source of his walking problem was identified.  The patient has undergone physical therapy with good benefit, when he walks with a cane he has not had any falls.  The patient and his wife believe that his ability to walk has improved since last seen.  The patient does drink alcohol but he claims that he drinks only once or twice a month.  The patient continues to work.  He returns to this office for an evaluation.  Past Medical History:  Diagnosis Date  . Gait disorder 02/04/2017  . High cholesterol   . Hypertension     History reviewed. No pertinent surgical history.  Family History  Problem Relation Age of Onset  . Heart attack Mother   . Heart disease Father   . Cancer - Prostate Father   . Cancer Brother        renal    Social history:  reports that he has never smoked. He has never used smokeless tobacco. He reports that he drinks alcohol. He reports that he does not use drugs.   No Known Allergies  Medications:  Prior to Admission medications   Medication Sig Start Date End Date Taking? Authorizing Provider  aspirin 81 MG tablet Take 81 mg by mouth daily.    [provider]  aspirin-acetaminophen-caffeine (EXCEDRIN MIGRAINE) [phone] MG tablet Take 2 tablets by mouth daily as needed for headache.    [provider]  atorvastatin (LIPITOR) 20 MG tablet Take 20 mg by mouth daily.  04/26/17   [provider]  lisinopril (PRINIVIL,ZESTRIL) 10 MG tablet Take 10 mg by mouth daily. 11/20/16   [provider]    ROS:  Out of a complete 14 system review of symptoms, the patient complains only of the following symptoms, and all other reviewed  systems are negative.  Walking difficulty  Blood pressure (!) 143/77, pulse 71, height 5\' 10"  (1.778 m), weight 201 lb 8 oz (91.4 kg).  Physical Exam  General: The patient is alert and cooperative at the time of the examination.  Skin: No significant peripheral edema is noted.   Neurologic Exam  Mental status: The patient is alert and oriented x 3 at the time of the examination. The patient has apparent normal recent and remote memory, with an apparently normal attention span and concentration ability.   Cranial nerves: Facial symmetry is present. Speech is normal, no aphasia or dysarthria is noted. Extraocular movements are full. Visual fields are full.  Motor: The patient has good strength in all 4 extremities.  Sensory examination: Soft touch sensation is symmetric on the face, arms, and legs.  Coordination: The patient has good finger-nose-finger and heel-to-shin bilaterally.  Gait and station: The patient has a slightly wide-based gait, the patient can walk independently. Tandem gait is minimally unsteady. Romberg is negative, but is slightly unsteady. No drift is seen.  Reflexes: Deep tendon reflexes are symmetric.   MRI brain 02/21/17:  IMPRESSION:  This MRI of the brain without contrast shows the following: 1.   Severe generalized cortical atrophy. 2.   Chronic microvascular ischemic changes. 3.   Right Maxillary, right greater than left ethmoid and right frontal chronic sinusitis.  4.   There are no acute findings.  * MRI scan images were reviewed online. I agree with the written report.    Assessment/Plan:  1.  Mild gait disturbance  The patient is doing fairly well with his walking currently.  He is able to perform tandem gait which is probably a bit above average for someone his age.  The patient has extensive cortical and cerebellar atrophy that may be the source of his current gait disturbance.  At this point, the patient will follow-up through this office  on an as-needed basis, he will contact me if anything changes with his underlying clinical condition.  Greater than 50% of the visit was spent in counseling and coordination of care.  Face-to-face time with the patient was 20 minutes.   Jill Alexanders MD 12/28/2017 11:03 AM  [facility] [address] [place], [place] [address]  Phone [phone] Fax [phone]

## 2019-04-13 ENCOUNTER — Observation Stay (HOSPITAL_COMMUNITY): Payer: Medicare Other

## 2019-04-13 ENCOUNTER — Other Ambulatory Visit: Payer: Self-pay

## 2019-04-13 ENCOUNTER — Emergency Department (HOSPITAL_COMMUNITY): Payer: Medicare Other

## 2019-04-13 ENCOUNTER — Observation Stay (HOSPITAL_COMMUNITY)
Admission: EM | Admit: 2019-04-13 | Discharge: 2019-04-14 | Disposition: A | Discharge status: Home / Self Care | Payer: Medicare Other | Attending: Internal Medicine | Admitting: Internal Medicine

## 2019-04-13 DIAGNOSIS — R2681 Unsteadiness on feet: Secondary | ICD-10-CM | POA: Insufficient documentation

## 2019-04-13 DIAGNOSIS — Z20822 Contact with and (suspected) exposure to covid-19: Secondary | ICD-10-CM | POA: Diagnosis not present

## 2019-04-13 DIAGNOSIS — G459 Transient cerebral ischemic attack, unspecified: Secondary | ICD-10-CM | POA: Diagnosis not present

## 2019-04-13 DIAGNOSIS — Z79899 Other long term (current) drug therapy: Secondary | ICD-10-CM | POA: Diagnosis not present

## 2019-04-13 DIAGNOSIS — R4781 Slurred speech: Secondary | ICD-10-CM | POA: Diagnosis not present

## 2019-04-13 DIAGNOSIS — I10 Essential (primary) hypertension: Secondary | ICD-10-CM | POA: Diagnosis not present

## 2019-04-13 DIAGNOSIS — Z7982 Long term (current) use of aspirin: Secondary | ICD-10-CM | POA: Insufficient documentation

## 2019-04-13 DIAGNOSIS — E785 Hyperlipidemia, unspecified: Secondary | ICD-10-CM | POA: Diagnosis not present

## 2019-04-13 DIAGNOSIS — R1310 Dysphagia, unspecified: Secondary | ICD-10-CM | POA: Diagnosis present

## 2019-04-13 LAB — CBC
HCT: 40.1 % (ref 39.0–52.0)
Hemoglobin: 13 g/dL (ref 13.0–17.0)
MCH: 29.5 pg (ref 26.0–34.0)
MCHC: 32.4 g/dL (ref 30.0–36.0)
MCV: 91.1 fL (ref 80.0–100.0)
Platelets: 184 10*3/uL (ref 150–400)
RBC: 4.4 MIL/uL (ref 4.22–5.81)
RDW: 14.3 % (ref 11.5–15.5)
WBC: 7.4 10*3/uL (ref 4.0–10.5)
nRBC: 0 % (ref 0.0–0.2)

## 2019-04-13 LAB — PHOSPHORUS: Phosphorus: 3.9 mg/dL (ref 2.5–4.6)

## 2019-04-13 LAB — RAPID URINE DRUG SCREEN, HOSP PERFORMED
Amphetamines: NOT DETECTED
Barbiturates: NOT DETECTED
Benzodiazepines: NOT DETECTED
Cocaine: NOT DETECTED
Opiates: NOT DETECTED
Tetrahydrocannabinol: NOT DETECTED

## 2019-04-13 LAB — TSH: TSH: 1.854 u[IU]/mL (ref 0.350–4.500)

## 2019-04-13 LAB — COMPREHENSIVE METABOLIC PANEL
ALT: 20 U/L (ref 0–44)
AST: 24 U/L (ref 15–41)
Albumin: 4.3 g/dL (ref 3.5–5.0)
Alkaline Phosphatase: 77 U/L (ref 38–126)
Anion gap: 9 (ref 5–15)
BUN: 23 mg/dL (ref 8–23)
CO2: 25 mmol/L (ref 22–32)
Calcium: 9 mg/dL (ref 8.9–10.3)
Chloride: 102 mmol/L (ref 98–111)
Creatinine, Ser: 1.11 mg/dL (ref 0.61–1.24)
GFR calc Af Amer: >60 mL/min (ref >60)
GFR calc non Af Amer: 60 mL/min — ABNORMAL LOW (ref >60)
Glucose, Bld: 98 mg/dL (ref 70–99)
Potassium: 4.2 mmol/L (ref 3.5–5.1)
Sodium: 136 mmol/L (ref 135–145)
Total Bilirubin: 0.4 mg/dL (ref 0.3–1.2)
Total Protein: 7 g/dL (ref 6.5–8.1)

## 2019-04-13 LAB — DIFFERENTIAL
Abs Immature Granulocytes: 0.03 10*3/uL (ref 0.00–0.07)
Basophils Absolute: 0 10*3/uL (ref 0.0–0.1)
Basophils Relative: 1 %
Eosinophils Absolute: 0.1 10*3/uL (ref 0.0–0.5)
Eosinophils Relative: 1 %
Immature Granulocytes: 0 %
Lymphocytes Relative: 38 %
Lymphs Abs: 2.8 10*3/uL (ref 0.7–4.0)
Monocytes Absolute: 0.7 10*3/uL (ref 0.1–1.0)
Monocytes Relative: 10 %
Neutro Abs: 3.8 10*3/uL (ref 1.7–7.7)
Neutrophils Relative %: 50 %

## 2019-04-13 LAB — I-STAT CHEM 8, ED
BUN: 25 mg/dL — ABNORMAL HIGH (ref 8–23)
Calcium, Ion: 1.2 mmol/L (ref 1.15–1.40)
Chloride: 104 mmol/L (ref 98–111)
Creatinine, Ser: 1.2 mg/dL (ref 0.61–1.24)
Glucose, Bld: 93 mg/dL (ref 70–99)
HCT: 38 % — ABNORMAL LOW (ref 39.0–52.0)
Hemoglobin: 12.9 g/dL — ABNORMAL LOW (ref 13.0–17.0)
Potassium: 4.1 mmol/L (ref 3.5–5.1)
Sodium: 139 mmol/L (ref 135–145)
TCO2: 26 mmol/L (ref 22–32)

## 2019-04-13 LAB — URIC ACID: Uric Acid, Serum: 7.1 mg/dL (ref 3.7–8.6)

## 2019-04-13 LAB — APTT: aPTT: 33 seconds (ref 24–36)

## 2019-04-13 LAB — PROTIME-INR
INR: 1 (ref 0.8–1.2)
Prothrombin Time: 12.9 seconds (ref 11.4–15.2)

## 2019-04-13 LAB — SARS CORONAVIRUS 2 (TAT 6-24 HRS): SARS Coronavirus 2: NEGATIVE

## 2019-04-13 LAB — CBG MONITORING, ED: Glucose-Capillary: 91 mg/dL (ref 70–99)

## 2019-04-13 LAB — LACTATE DEHYDROGENASE: LDH: 234 U/L — ABNORMAL HIGH (ref 98–192)

## 2019-04-13 LAB — MAGNESIUM: Magnesium: 2.2 mg/dL (ref 1.7–2.4)

## 2019-04-13 IMAGING — CT CT HEAD CODE STROKE
4 series · 16 of 47 positions shown, 18 images · non-contrast
Comparison: None available.

CLINICAL DATA: Code stroke. 88-year-old male with right facial
droop and aphasia.

EXAM:
CT HEAD WITHOUT CONTRAST
TECHNIQUE: Contiguous axial images were obtained from the base of the skull
through the vertex without intravenous contrast.

[Series 3: head 5.0 st · axial · 0.46mm/px · z∈[-132,-12]mm · 7 of 34 slices shown, 9 images]
[im 5/34  brain]
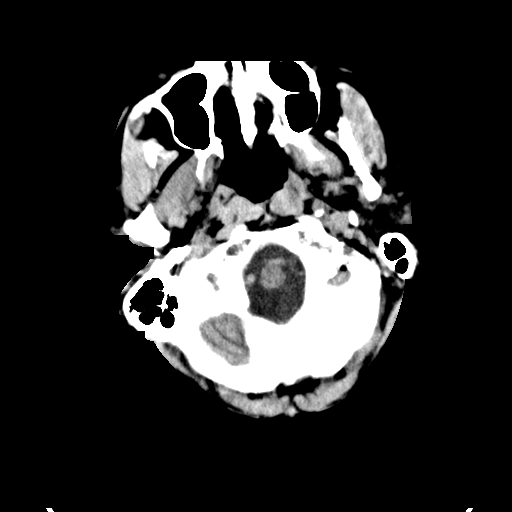
[im 5/34  bone]
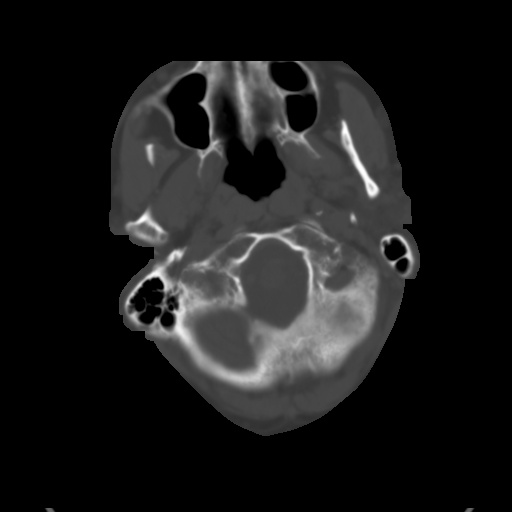
[im 9/34  brain]
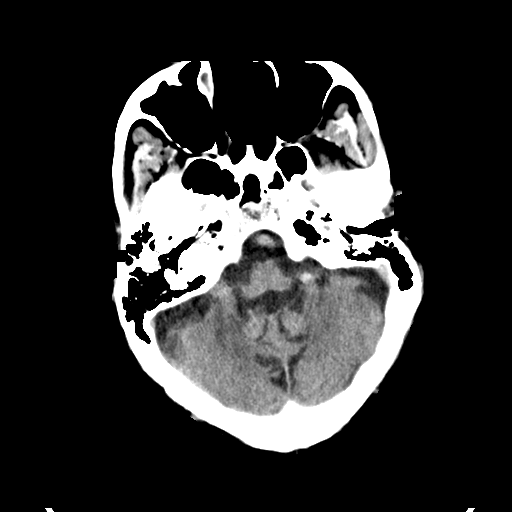
[im 13/34  brain]
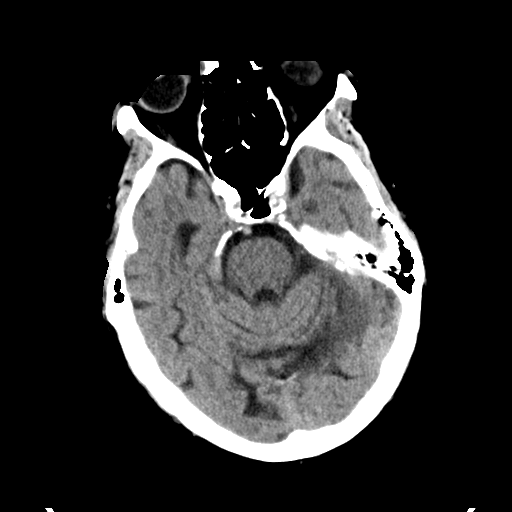
[im 17/34  brain]
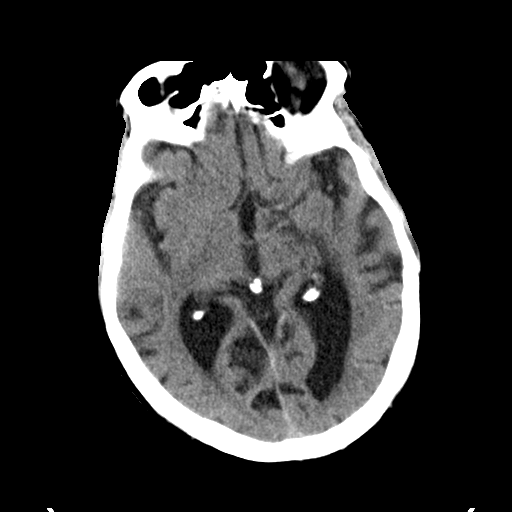
[im 21/34  brain]
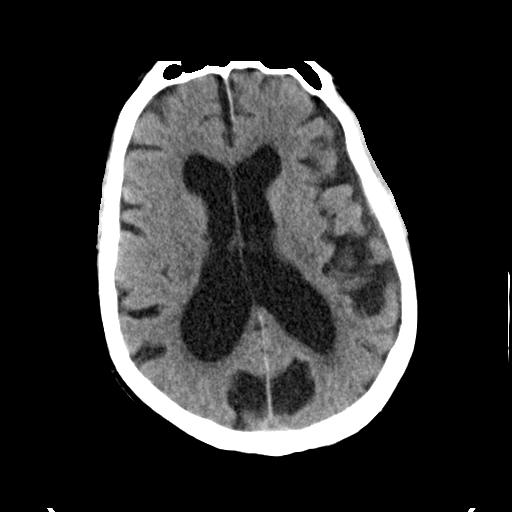
[im 21/34  bone]
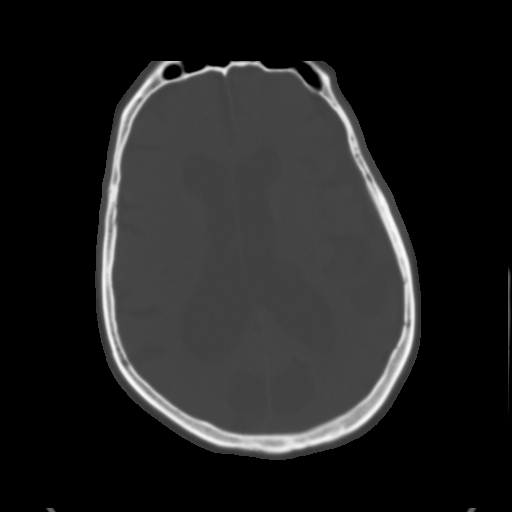
[im 25/34  brain]
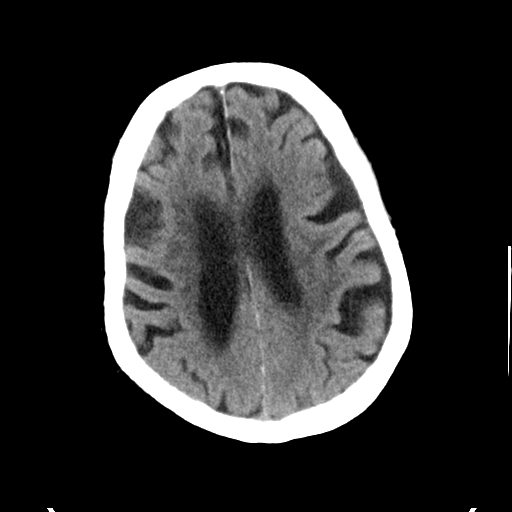
[im 29/34  brain]
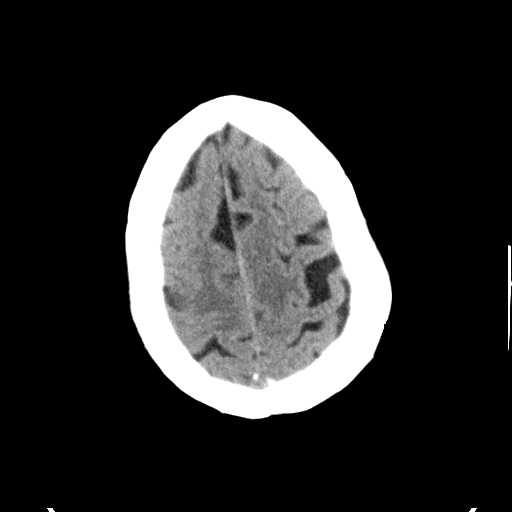

[Series 4: head 2.0 bone · axial · 0.46mm/px · z∈[-136,-102]mm · 3 of 85 slices shown]
[im 9/85  bone]
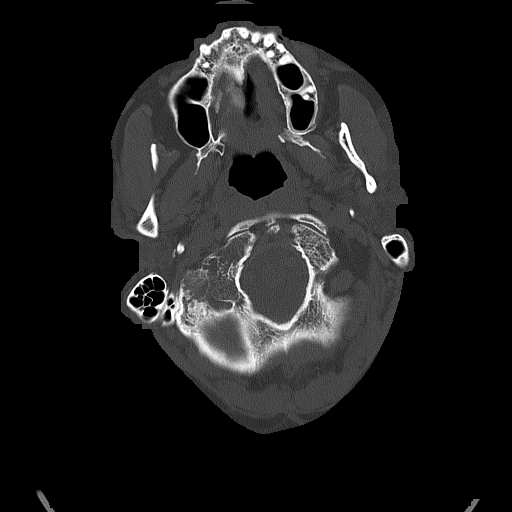
[im 17/85  bone]
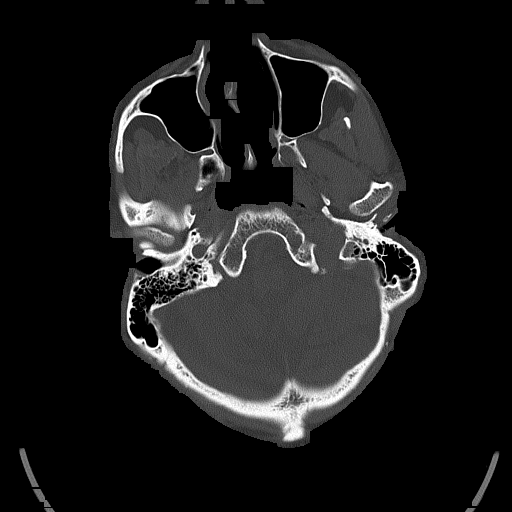
[im 26/85  bone]
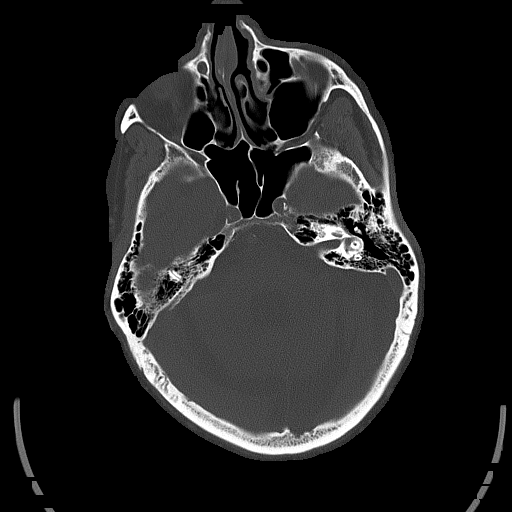

[Series 5: head 3.0 cor st · coronal · 0.33mm/px · 3 of 76 slices shown]
[im 26/76  brain]
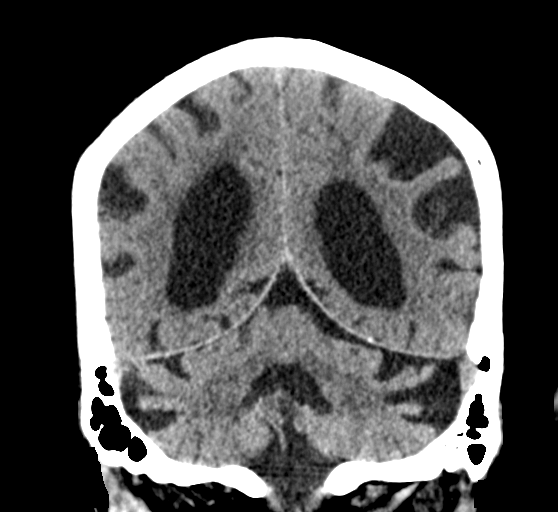
[im 34/76  brain]
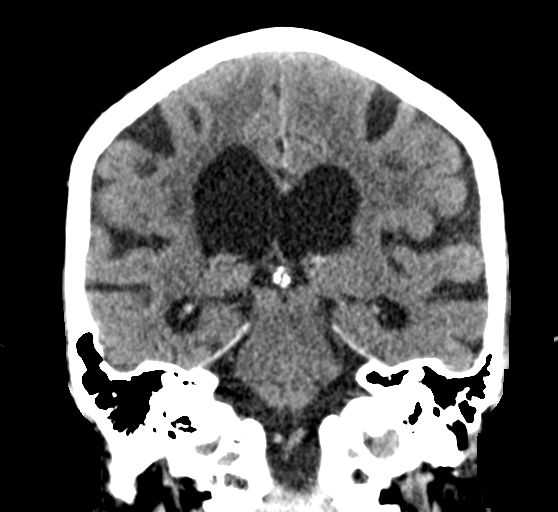
[im 42/76  brain]
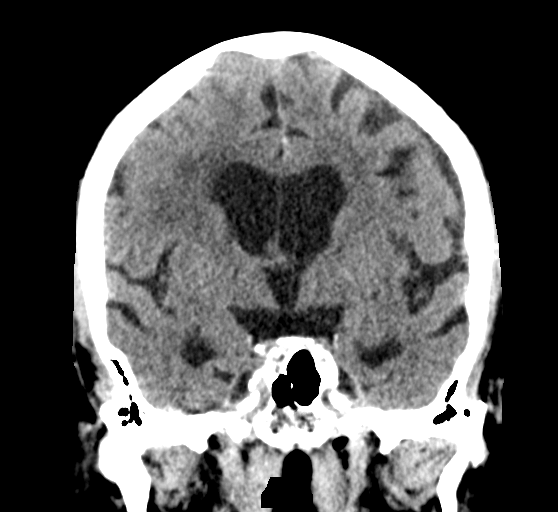

[Series 6: head 3.0 sag st · sagittal · 0.34mm/px · 3 of 67 slices shown]
[im 23/67  brain]
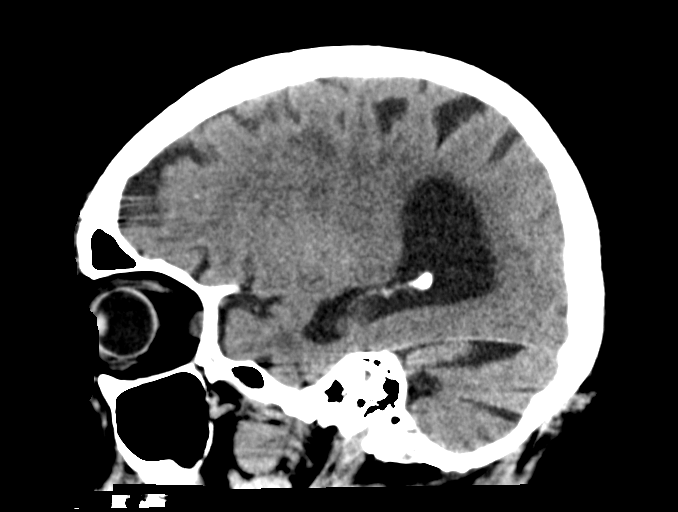
[im 34/67  brain]
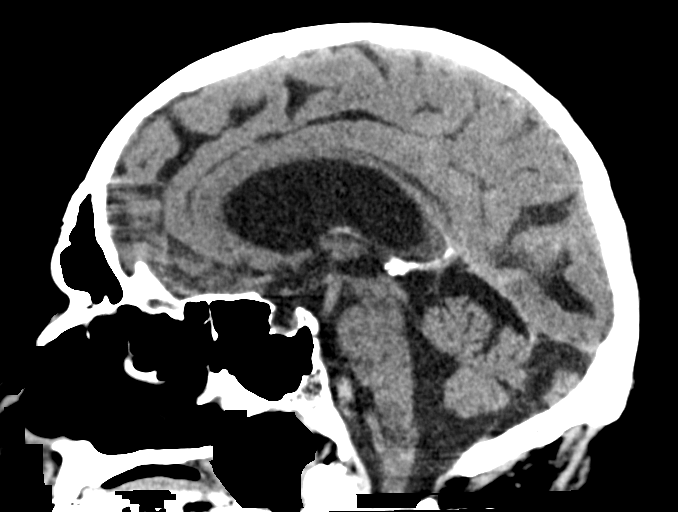
[im 45/67  brain]
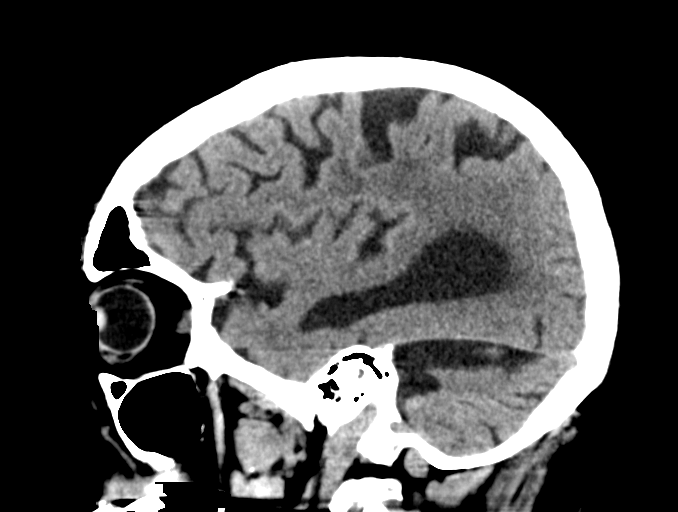

[16 of 47 positions shown; findings below may reference images not displayed]

FINDINGS: Brain: Cerebral volume loss which appears fairly generalized and is
felt to explain ventricular prominence on an ex vacuo basis. No
midline shift, mass effect, or evidence of intracranial mass lesion.

No acute intracranial hemorrhage identified.

Patchy bilateral white matter hypodensity with some involvement of
the deep white matter capsules. Deep gray nuclei are relatively
spared.

No superimposed acute cortically based infarct identified.

Vascular: Extensive Calcified atherosclerosis at the skull base. No
suspicious intracranial vascular hyperdensity.

Skull: No acute osseous abnormality identified.

Sinuses/Orbits: Visualized paranasal sinuses and mastoids are clear.

Other: No acute orbit or scalp soft tissue finding.

ASPECTS (Alberta Stroke Program Early CT Score)

Total score (0-10 with 10 being normal): 10
IMPRESSION: 1. No acute cortically based infarct or acute intracranial
hemorrhage identified. ASPECTS 10.
2. Generalized cerebral volume loss, and white matter changes most
commonly due to chronic small vessel disease.
3. These results were communicated to Dr. JUMPER at [DATE] on
[DATE] by text page via the [employer] messaging system.

## 2019-04-13 IMAGING — CT CT ANGIO HEAD
2 of 7 series · 8 of 33 positions shown · IV contrast (APPLIED)
Comparison: CT and MRI same day

CLINICAL DATA: Right facial droop and aphasia. No acute finding by
MRI.

EXAM:
CT ANGIOGRAPHY HEAD AND NECK
TECHNIQUE: Multidetector CT imaging of the head and neck was performed using
the standard protocol during bolus administration of intravenous
contrast. Multiplanar CT image reconstructions and MIPs were
obtained to evaluate the vascular anatomy. Carotid stenosis
measurements (when applicable) are obtained utilizing NASCET
criteria, using the distal internal carotid diameter as the
denominator.
CONTRAST:  100mL OMNIPAQUE IOHEXOL 350 MG/ML SOLN

[Series 5: cta neck/head · axial · 0.49mm/px · z∈[+1192,+1322]mm · 2 of 196 slices shown]
[im 66/196  soft-tissue]
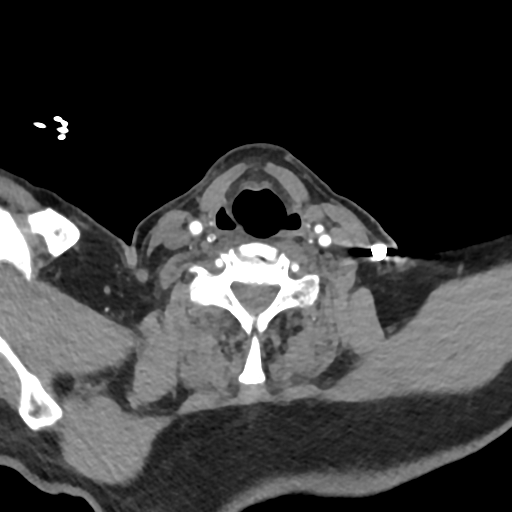
[im 131/196  soft-tissue]
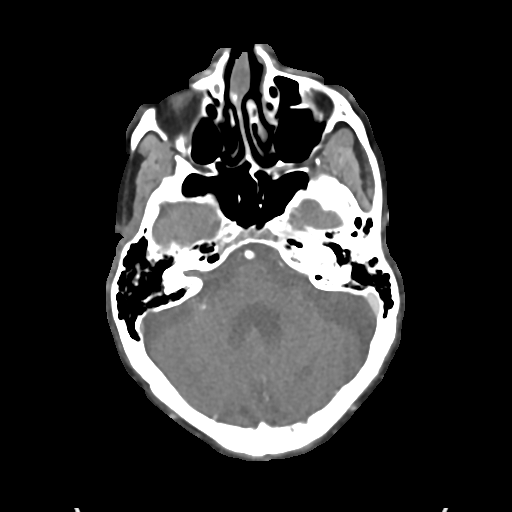

[Series 7: ax thins · axial · 0.48mm/px · z∈[+1117,+1397]mm · 6 of 392 slices shown]
[im 56/392  soft-tissue]
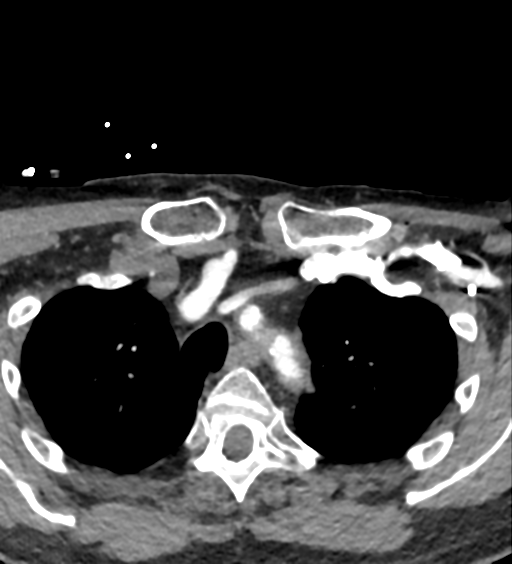
[im 112/392  bone]
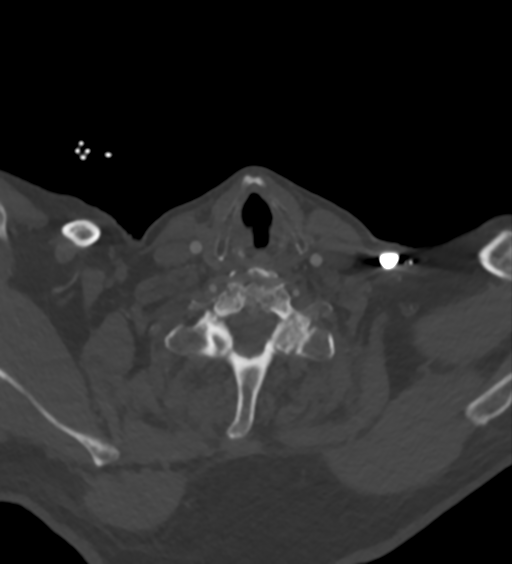
[im 168/392  soft-tissue]
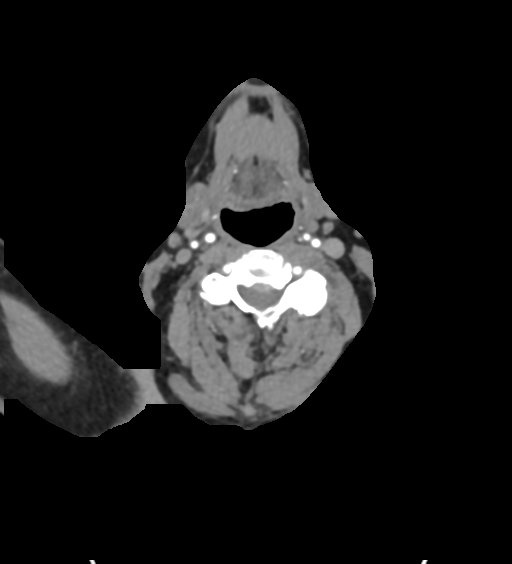
[im 224/392  bone]
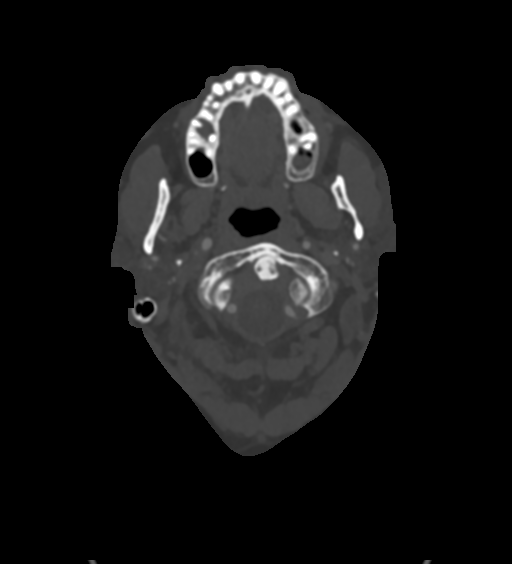
[im 280/392  soft-tissue]
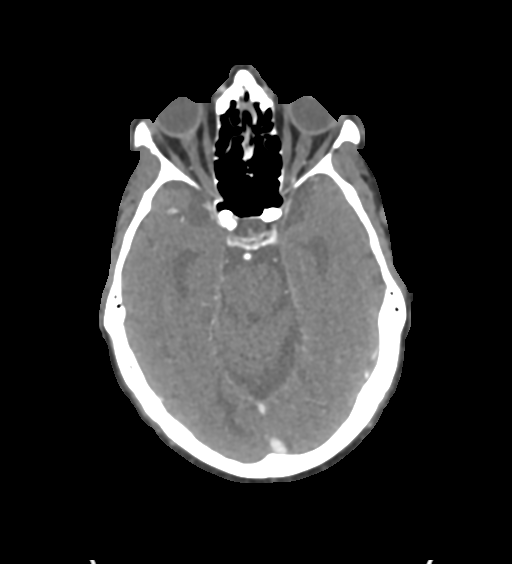
[im 336/392  bone]
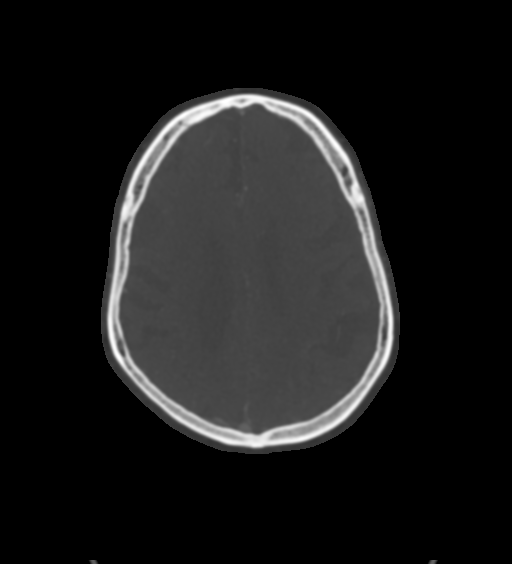

[8 of 33 positions shown; findings below may reference images not displayed]

FINDINGS: CTA NECK FINDINGS

Aortic arch: Aortic atherosclerosis. No aneurysm or dissection.
Branching pattern is normal without origin stenosis.

Right carotid system: Common carotid artery widely patent to the
bifurcation region. Calcified plaque at the carotid bifurcation and
ICA bulb. Minimal ICA diameter 3 mm. Compared to a more distal
cervical ICA diameter of 4 mm, this indicates a 25% stenosis. No
abnormality seen in the more distal cervical ICA.

Left carotid system: Common carotid artery widely patent to the
bifurcation. No soft or calcified plaque at the carotid bifurcation
or ICA bulb. No stenosis. Cervical ICA shows some calcified plaque
and mild wall irregularity at the skull base level. No stenosis
however.

Vertebral arteries: Both vertebral artery origins are widely patent.
Both vertebral arteries appear normal through the cervical region to
the foramen magnum.

Skeleton: Ordinary cervical spondylosis.

Other neck: No mass or lymphadenopathy.

Upper chest: Normal

Review of the MIP images confirms the above findings

CTA HEAD FINDINGS

Anterior circulation: Both internal carotid arteries are patent
through the skull base and siphon regions. There is ordinary siphon
atherosclerotic calcification but no stenosis greater than 30%
suspected. The anterior and middle cerebral vessels are patent
without large or medium vessel occlusion, proximal stenosis,
aneurysm or vascular malformation.

Posterior circulation: Both vertebral arteries are patent to the
basilar. No basilar stenosis. Posterior circulation branch vessels
are patent.

Venous sinuses: Patent and normal.

Anatomic variants: None significant.

Review of the MIP images confirms the above findings
IMPRESSION: No large or medium vessel occlusion.

Aortic atherosclerosis.

Atherosclerotic change at the right carotid bifurcation region.
Maximal stenosis in the ICA bulb is only 25%.

No left carotid bifurcation stenosis. Mild atherosclerotic change at
the upper cervical ICA with calcified plaque and mild wall
irregularity but no stenosis.

No posterior circulation pathology.

## 2019-04-13 IMAGING — MR MR HEAD W/O CM
5 series · 48 of 48 positions shown · non-contrast
Comparison: [DATE]

CLINICAL DATA: Follow-up stroke.  Right facial droop.  Aphasia.

EXAM:
MRI HEAD WITHOUT CONTRAST
TECHNIQUE: Multiplanar, multiecho pulse sequences of the brain and surrounding
structures were obtained without intravenous contrast.

[Series 2: DWI · axial · 5.0mm · 0.94mm/px · z∈[-35,+130]mm · 16 of 68 slices shown]
[im 1/68]
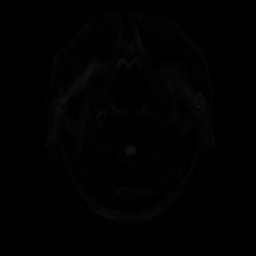
[im 5/68]
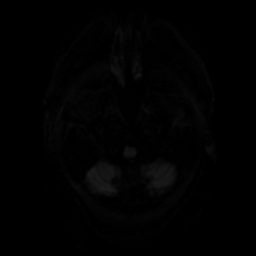
[im 9/68]
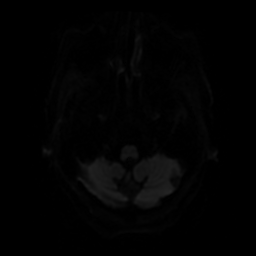
[im 14/68]
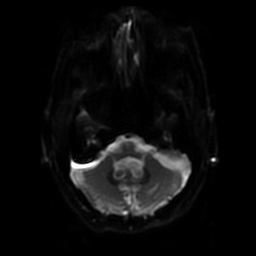
[im 18/68]
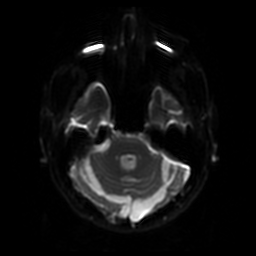
[im 23/68]
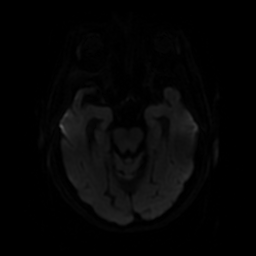
[im 27/68]
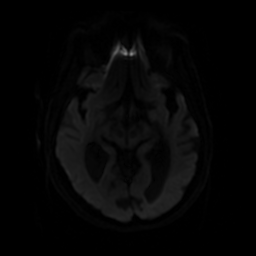
[im 32/68]
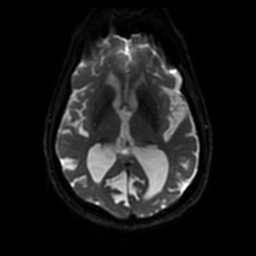
[im 36/68]
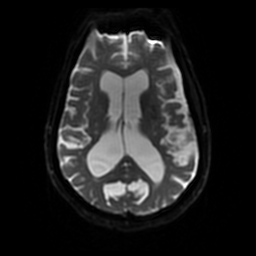
[im 41/68]
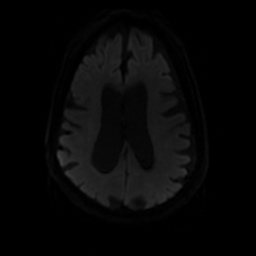
[im 45/68]
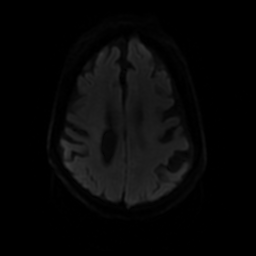
[im 50/68]
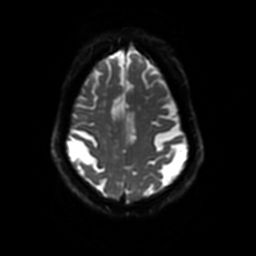
[im 54/68]
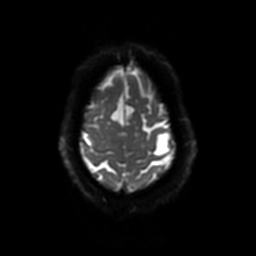
[im 59/68]
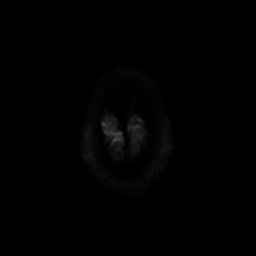
[im 63/68]
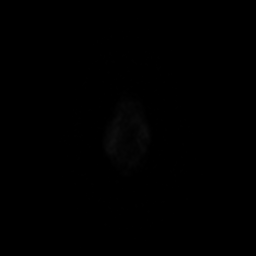
[im 68/68]
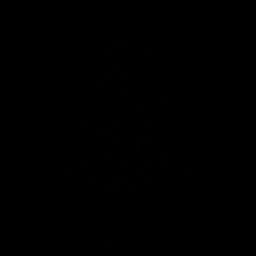

[Series 3: FLAIR · axial · 5.0mm · 0.94mm/px · z∈[-35,+130]mm · 8 of 34 slices shown]
[im 1/34]
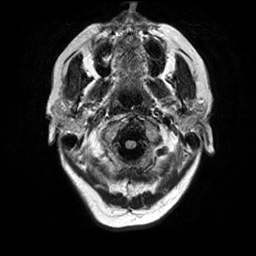
[im 5/34]
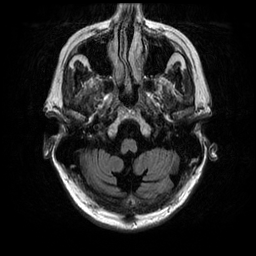
[im 10/34]
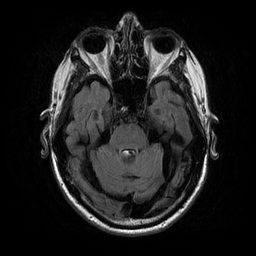
[im 15/34]
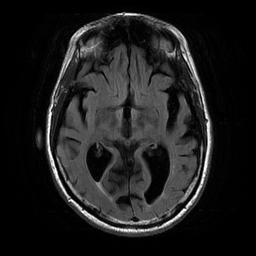
[im 19/34]
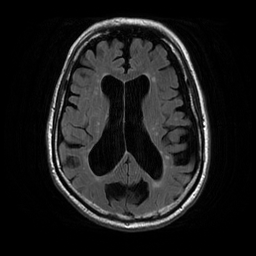
[im 24/34]
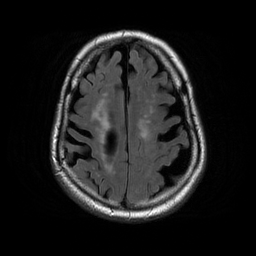
[im 29/34]
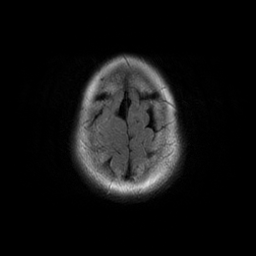
[im 34/34]
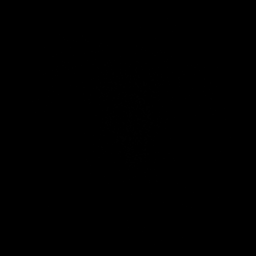

[Series 4: T2-star · axial · 5.0mm · 0.94mm/px · z∈[-35,+130]mm · 8 of 34 slices shown]
[im 1/34]
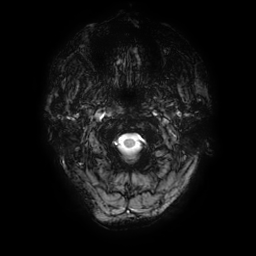
[im 5/34]
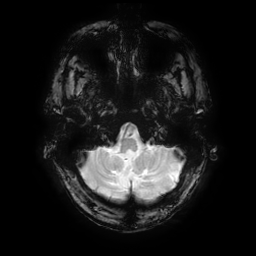
[im 10/34]
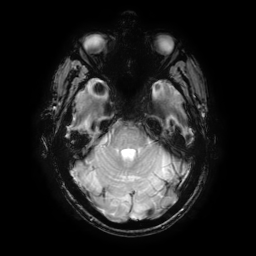
[im 15/34]
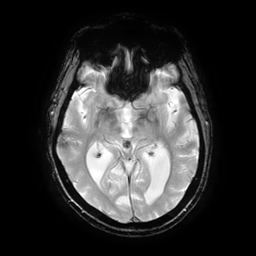
[im 19/34]
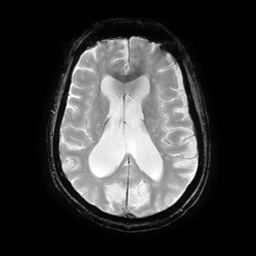
[im 24/34]
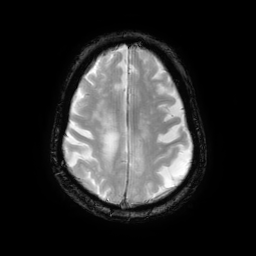
[im 29/34]
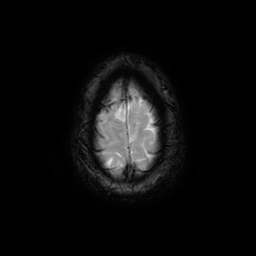
[im 34/34]
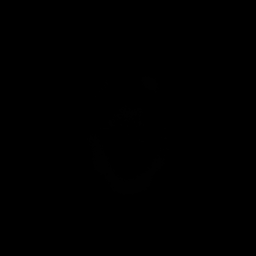

[Series 5: T1 · axial · 5.0mm · 0.94mm/px · z∈[-35,+130]mm · 8 of 34 slices shown]
[im 1/34]
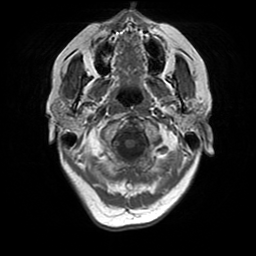
[im 5/34]
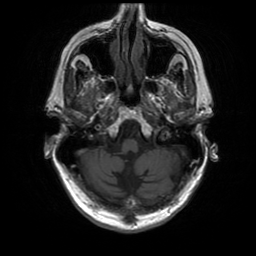
[im 10/34]
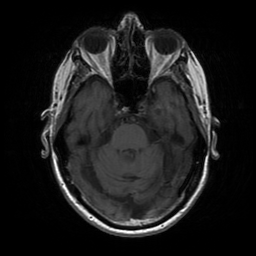
[im 15/34]
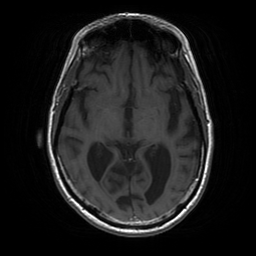
[im 19/34]
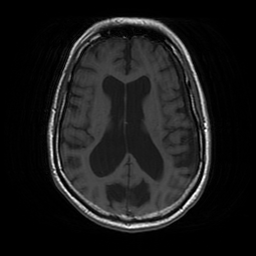
[im 24/34]
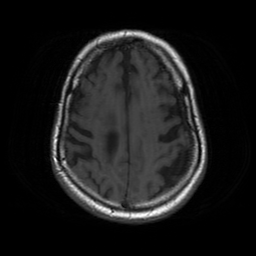
[im 29/34]
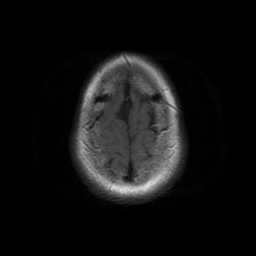
[im 34/34]
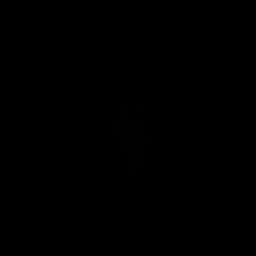

[Series 250: ADC · axial · 5.0mm · 0.94mm/px · z∈[-35,+120]mm · 8 of 32 slices shown]
[im 1/32]
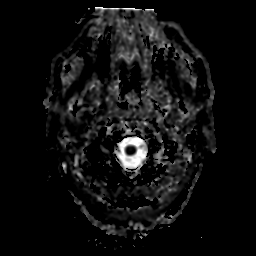
[im 5/32]
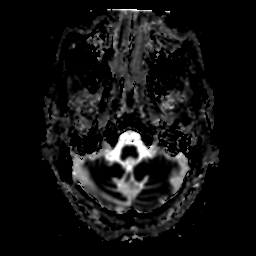
[im 9/32]
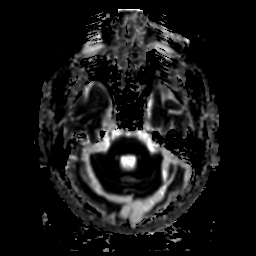
[im 14/32]
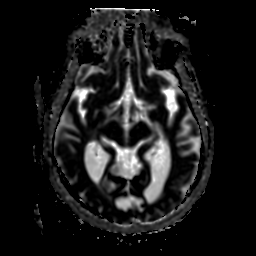
[im 18/32]
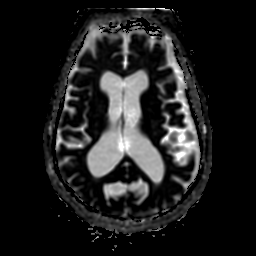
[im 23/32]
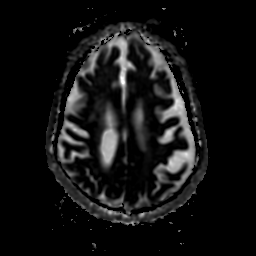
[im 27/32]
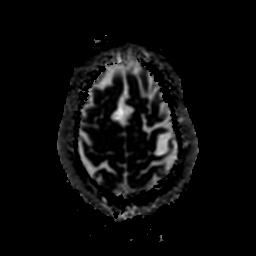
[im 32/32]
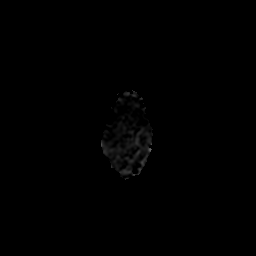

[48 of 48 positions shown; findings below may reference images not displayed]

FINDINGS: Brain: Diffusion imaging does not show any acute or subacute
infarction. Brainstem does not show focal lesion. There is
cerebellar atrophy. Cerebral hemispheres show generalized atrophy
with moderate chronic small-vessel ischemic changes of the deep and
subcortical white matter. No mass, hemorrhage, hydrocephalus or
extra-axial collection.

Vascular: Major vessels at the base of the brain show flow.

Skull and upper cervical spine: Negative

Sinuses/Orbits: Clear/normal

Other: None
IMPRESSION: No acute infarction. Generalized brain atrophy. Chronic small-vessel
ischemic changes of the cerebral hemispheric white matter.

## 2019-04-13 IMAGING — MR MR MRA NECK WO/W CM
4 of 15 series · 19 of 48 positions shown · IV contrast (Yes MH)
Comparison: None.

CLINICAL DATA: TIA.  Right facial droop and aphasia.

EXAM:
MRA NECK WITHOUT AND WITH CONTRAST
TECHNIQUE: Multiplanar and multiecho pulse sequences of the neck were obtained
without and with intravenous contrast. Angiographic images of the
neck were obtained using MRA technique without and with intravenous
contrast.
CONTRAST:  9.5mL GADAVIST GADOBUTROL 1 MMOL/ML IV SOLN

[Series 351: right carotid · axial · 2.4mm · 0.42mm/px · 1 of 2 slices shown]
[im 1/2]
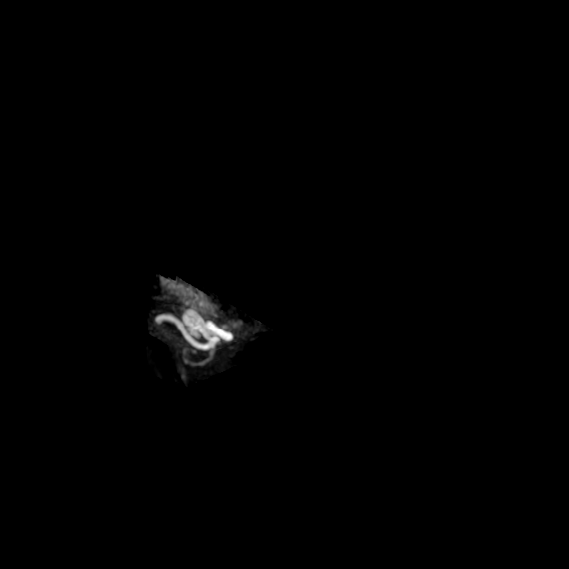

[Series 400: cor cemra ft · coronal · 1.2mm · 0.59mm/px · 6 of 113 slices shown]
[im 1/113]
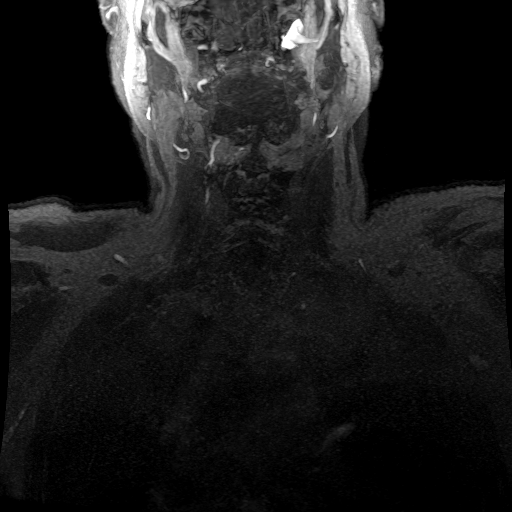
[im 23/113]
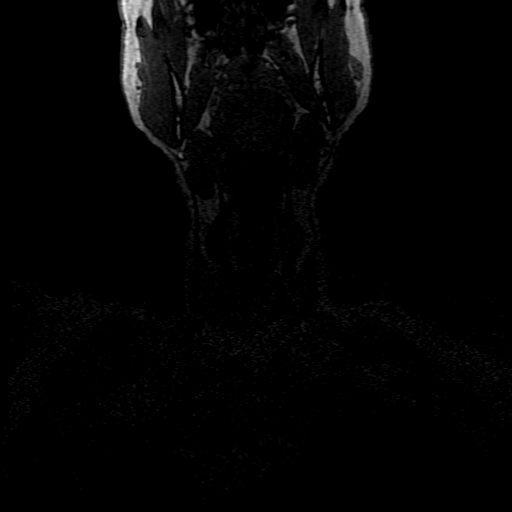
[im 45/113]
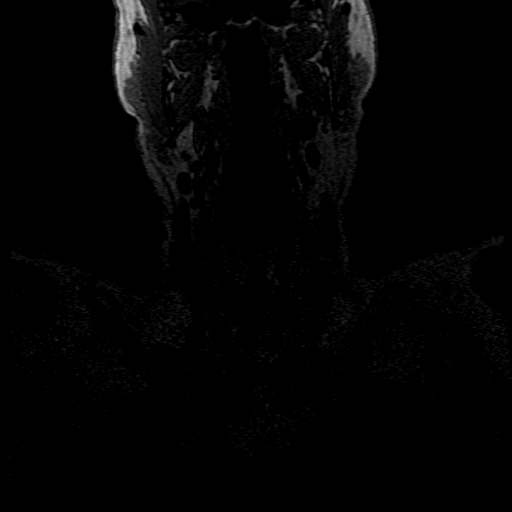
[im 68/113]
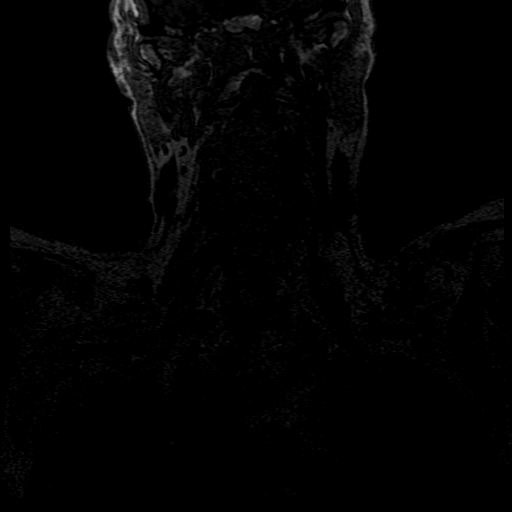
[im 90/113]
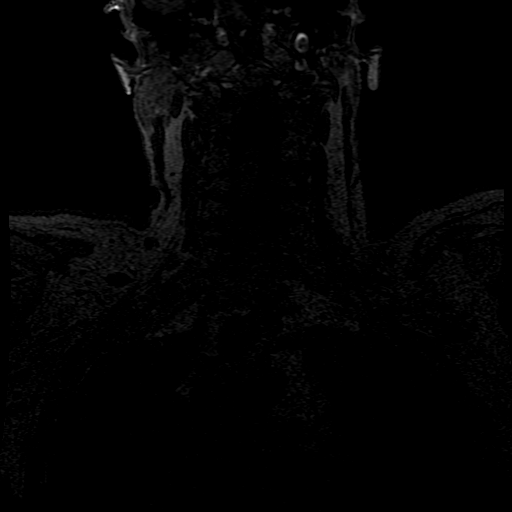
[im 113/113]
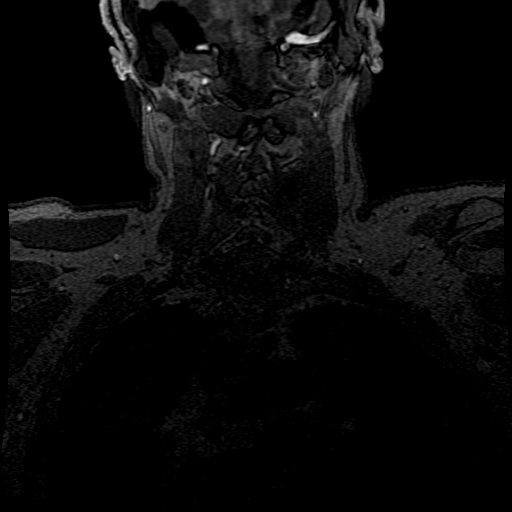

[Series 401: ph1/cor cemra ft · coronal · 1.2mm · 0.59mm/px · 6 of 112 slices shown]
[im 1/112]
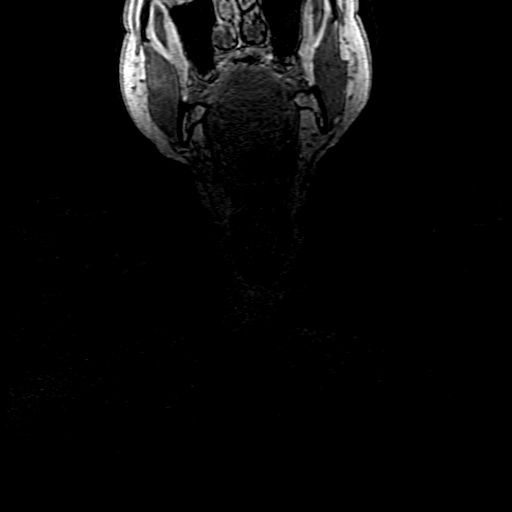
[im 23/112]
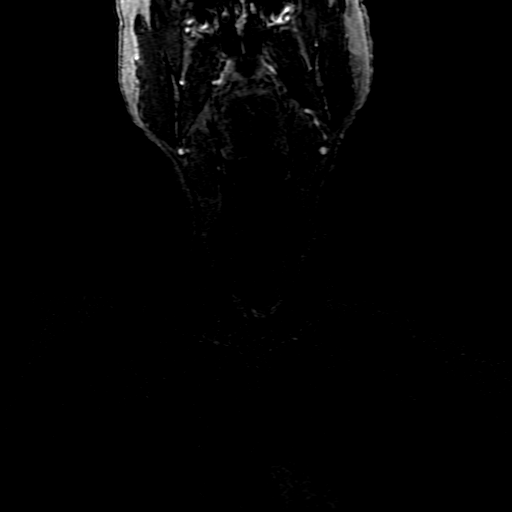
[im 45/112]
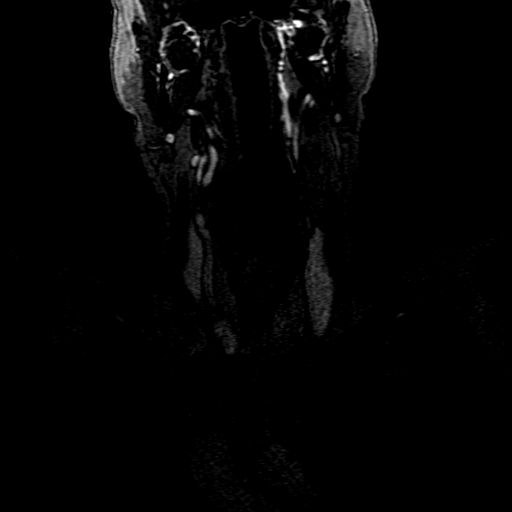
[im 67/112]
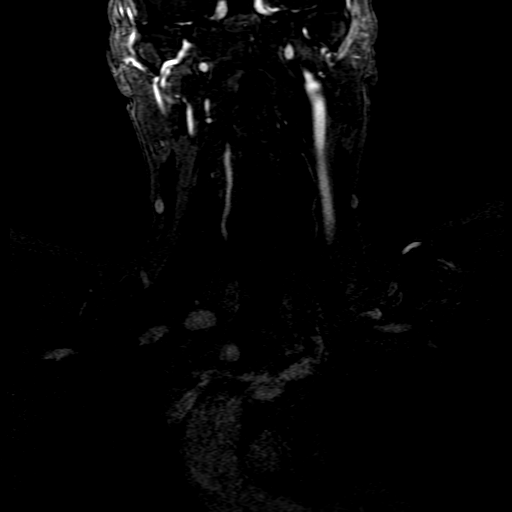
[im 89/112]
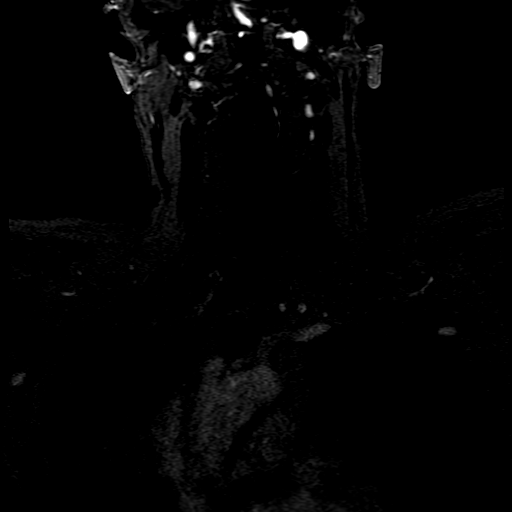
[im 112/112]
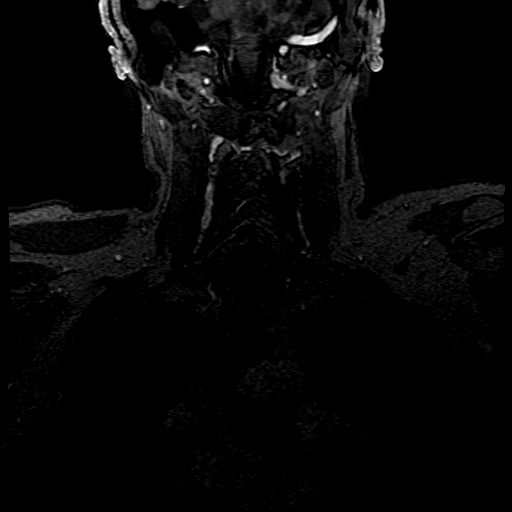

[Series 402: ph2/cor cemra ft · coronal · 1.2mm · 0.59mm/px · 6 of 113 slices shown]
[im 1/113]
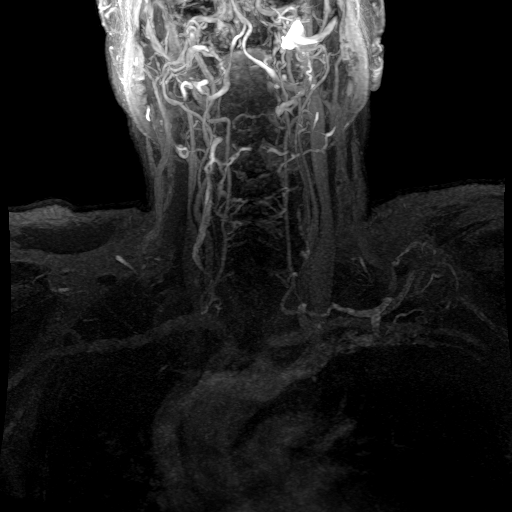
[im 23/113]
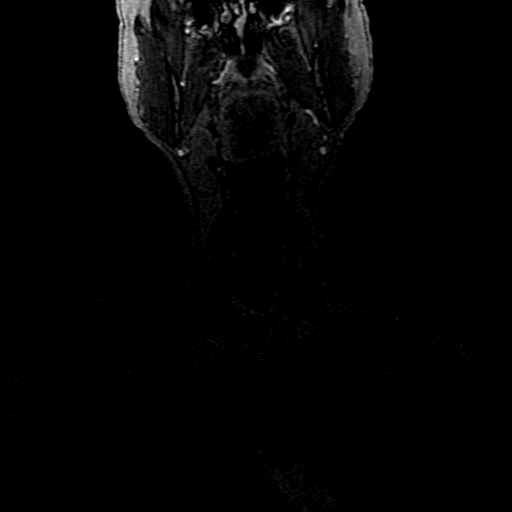
[im 45/113]
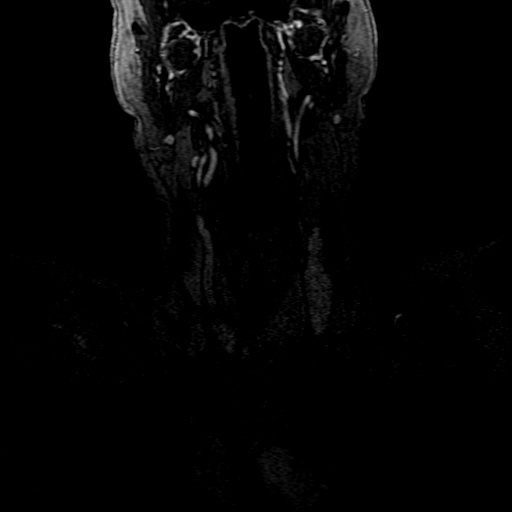
[im 68/113]
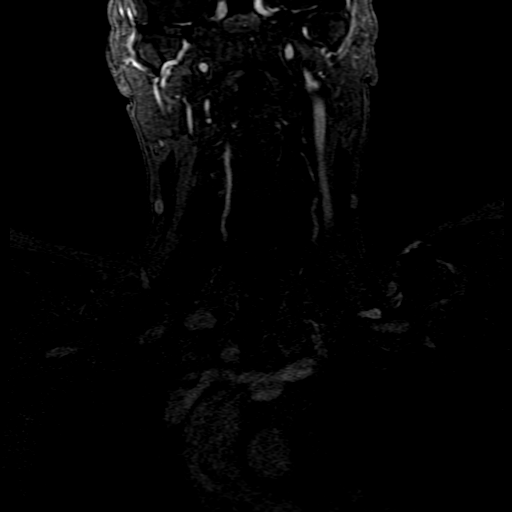
[im 90/113]
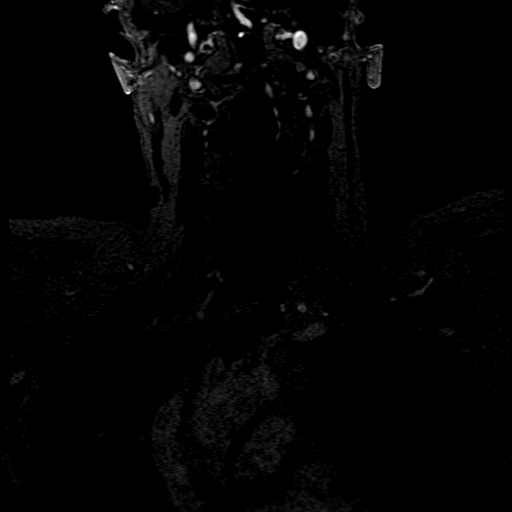
[im 113/113]
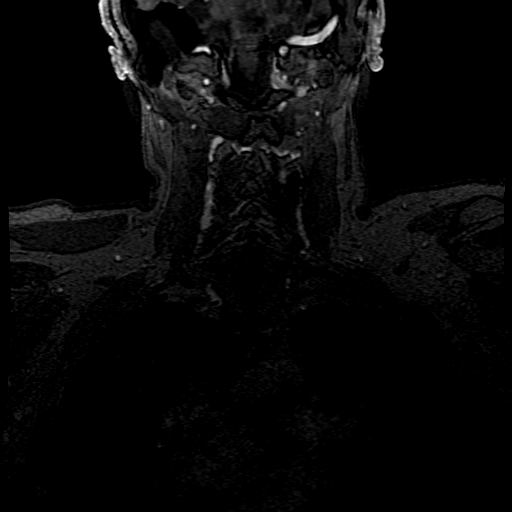

[19 of 48 positions shown; findings below may reference images not displayed]

FINDINGS: No sign of stenosis or occlusion. Both carotid arteries appear
normal. Carotid bifurcations are normal. Both vertebral arteries
appear normal at their origins and through the cervical region to
the basilar.
IMPRESSION: Normal MR angiography of the neck vessels.

## 2019-04-13 MED ORDER — STUDY - AXIOMATIC STUDY - BMS986177 OR PLACEBO (PI-SETHI)
2.0000 | ORAL_CAPSULE | Freq: Two times a day (BID) | ORAL | Status: DC
  Administered 2019-04-13 – 2019-04-14 (×2): 2 via ORAL
  Filled 2019-04-13 (×4): qty 2

## 2019-04-13 MED ORDER — ENOXAPARIN SODIUM 40 MG/0.4ML ~~LOC~~ SOLN
40.0000 mg | SUBCUTANEOUS | Status: DC
  Administered 2019-04-13: 40 mg via SUBCUTANEOUS
  Filled 2019-04-13: qty 0.4

## 2019-04-13 MED ORDER — ACETAMINOPHEN 325 MG PO TABS: 650.0000 mg | ORAL_TABLET | ORAL | Status: DC | PRN

## 2019-04-13 MED ORDER — STUDY - AXIOMATIC STUDY - ASPIRIN 100 MG (PI-SETHI)
100.0000 mg | ORAL_TABLET | Freq: Every day | ORAL | Status: DC
  Administered 2019-04-13 – 2019-04-14 (×2): 100 mg via ORAL
  Filled 2019-04-13 (×3): qty 100

## 2019-04-13 MED ORDER — SODIUM CHLORIDE 0.9% FLUSH: 3.0000 mL | Freq: Once | INTRAVENOUS | Status: DC

## 2019-04-13 MED ORDER — STUDY - AXIOMATIC STUDY - CLOPIDOGREL 75MG (PI-SETHI)
300.0000 mg | ORAL_TABLET | Freq: Once | ORAL | Status: AC
  Administered 2019-04-13: 300 mg via ORAL
  Filled 2019-04-13: qty 300

## 2019-04-13 MED ORDER — LISINOPRIL 10 MG PO TABS: 10.0000 mg | ORAL_TABLET | Freq: Every day | ORAL | Status: DC

## 2019-04-13 MED ORDER — SODIUM CHLORIDE 0.9 % IV SOLN: INTRAVENOUS | Status: DC

## 2019-04-13 MED ORDER — ATORVASTATIN CALCIUM 10 MG PO TABS: 20.0000 mg | ORAL_TABLET | Freq: Every day | ORAL | Status: DC

## 2019-04-13 MED ORDER — STUDY - AXIOMATIC STUDY - CLOPIDOGREL 75MG (PI-SETHI)
75.0000 mg | ORAL_TABLET | ORAL | Status: DC
  Administered 2019-04-14: 75 mg via ORAL
  Filled 2019-04-13 (×2): qty 75

## 2019-04-13 MED ORDER — ACETAMINOPHEN 160 MG/5ML PO SOLN: 650.0000 mg | ORAL | Status: DC | PRN

## 2019-04-13 MED ORDER — ACETAMINOPHEN 650 MG RE SUPP: 650.0000 mg | RECTAL | Status: DC | PRN

## 2019-04-13 MED ORDER — IOHEXOL 350 MG/ML SOLN
100.0000 mL | Freq: Once | INTRAVENOUS | Status: AC | PRN
  Administered 2019-04-13: 100 mL via INTRAVENOUS

## 2019-04-13 MED ORDER — SENNOSIDES-DOCUSATE SODIUM 8.6-50 MG PO TABS: 1.0000 | ORAL_TABLET | Freq: Every evening | ORAL | Status: DC | PRN

## 2019-04-13 MED ORDER — STROKE: EARLY STAGES OF RECOVERY BOOK: Freq: Once | Status: DC

## 2019-04-13 MED ORDER — GADOBUTROL 1 MMOL/ML IV SOLN
9.5000 mL | Freq: Once | INTRAVENOUS | Status: AC | PRN
  Administered 2019-04-13: 9.5 mL via INTRAVENOUS

## 2019-04-13 MED ORDER — ASPIRIN 300 MG RE SUPP: 300.0000 mg | Freq: Every day | RECTAL | Status: DC

## 2019-04-13 MED ORDER — ATORVASTATIN CALCIUM 40 MG PO TABS
40.0000 mg | ORAL_TABLET | Freq: Every day | ORAL | Status: DC
  Administered 2019-04-14: 40 mg via ORAL
  Filled 2019-04-13: qty 1

## 2019-04-13 MED ORDER — ASPIRIN 325 MG PO TABS
325.0000 mg | ORAL_TABLET | Freq: Every day | ORAL | Status: DC
  Administered 2019-04-13: 325 mg via ORAL
  Filled 2019-04-13: qty 1

## 2019-04-13 NOTE — Consult Note (Signed)
Requesting Physician: Dr. Darl Householder    Chief Complaint: Transient slurred speech and facial droop  History obtained from: Patient and Chart   HPI:                                                                                                                                       Gabriel [name] is a 86 y.o. male with past medical history significant for hypertension, hyperlipidemia presents to the emergency department with slurred speech and facial droop, code stroke was activated in the emergency department by triage staff.  Last known normal was 9:30 AM, patient started to notice that his speech was slurred and he also noticed that his face was drooping.  His wife came to his office 11:00 he noticed he was having trouble with speech and expressing himself and they called the PCP who recommended to come to the emergency department right away. On arrival, I triage he apparently had a facial droop and a code stroke was activated.  On assessment CT scanner, patient no longer had facial droop and no longer slurring his speech with no language deficits noted.  NIH stroke scale was 0.  A stat CT head was obtained which showed no hemorrhage, acute findings.  tPA was not administered as symptoms have resolved.  Date last known well: 1.7.2021 Time last known well: 9:30 AM tPA Given: No, symptoms resolved NIHSS: 0 Baseline MRS 0  Past Medical History:  Diagnosis Date  . Gait disorder 02/04/2017  . High cholesterol   . Hypertension     No past surgical history on file.  Family History  Problem Relation Age of Onset  . Heart attack Mother   . Heart disease Father   . Cancer - Prostate Father   . Cancer Brother        renal   Social History:  reports that he has never smoked. He has never used smokeless tobacco. He reports current alcohol use. He reports that he does not use drugs.  Allergies: No Known Allergies  Medications:                                                                                                                         I reviewed home medications   ROS:  14 systems reviewed and negative except above    Examination:                                                                                                      General: Appears well-developed Psych: Affect appropriate to situation Eyes: No scleral injection HENT: No OP obstrucion Head: Normocephalic.  Cardiovascular: Normal rate and regular rhythm.  Respiratory: Effort normal and breath sounds normal to anterior ascultation GI: Soft.  No distension. There is no tenderness.  Skin: WDI    Neurological Examination Mental Status: Alert, oriented, thought content appropriate.  Speech fluent without evidence of aphasia. Able to follow 3 step commands without difficulty. Cranial Nerves: II: Visual fields grossly normal,  III,IV, VI: ptosis not present, extra-ocular motions intact bilaterally, pupils equal, round, reactive to light and accommodation V,VII: smile symmetric, facial light touch sensation normal bilaterally VIII: hearing normal bilaterally IX,X: uvula rises symmetrically XI: bilateral shoulder shrug XII: midline tongue extension Motor: Right : Upper extremity   5/5    Left:     Upper extremity   5/5  Lower extremity   5/5     Lower extremity   5/5 Tone and bulk:normal tone throughout; no atrophy noted Sensory: Pinprick and light touch intact throughout, bilaterally Deep Tendon Reflexes:  Plantars: Right: downgoing   Left: downgoing Cerebellar: normal finger-to-nose, normal rapid alternating movements and normal heel-to-shin test Gait: normal gait and station     Lab Results: Basic Metabolic Panel: Recent Labs  Lab 04/13/19 1235  NA 139  K 4.1  CL 104  GLUCOSE 93  BUN 25*  CREATININE 1.20    CBC: Recent Labs  Lab 04/13/19 1223  04/13/19 1235  WBC 7.4  --   NEUTROABS 3.8  --   HGB 13.0 12.9*  HCT 40.1 38.0*  MCV 91.1  --   PLT 184  --     Coagulation Studies: No results for input(s): LABPROT, INR in the last 72 hours.  Imaging: CT HEAD CODE STROKE WO CONTRAST  Result Date: 04/13/2019 CLINICAL DATA:  Code stroke. 86 year old male with right facial droop and aphasia. EXAM: CT HEAD WITHOUT CONTRAST TECHNIQUE: Contiguous axial images were obtained from the base of the skull through the vertex without intravenous contrast. COMPARISON:  None available. FINDINGS: Brain: Cerebral volume loss which appears fairly generalized and is felt to explain ventricular prominence on an ex vacuo basis. No midline shift, mass effect, or evidence of intracranial mass lesion. No acute intracranial hemorrhage identified. Patchy bilateral white matter hypodensity with some involvement of the deep white matter capsules. Deep gray nuclei are relatively spared. No superimposed acute cortically based infarct identified. Vascular: Extensive Calcified atherosclerosis at the skull base. No suspicious intracranial vascular hyperdensity. Skull: No acute osseous abnormality identified. Sinuses/Orbits: Visualized paranasal sinuses and mastoids are clear. Other: No acute orbit or scalp soft tissue finding. ASPECTS [facility] Stroke Program Early CT Score) Total score (0-10 with 10 being normal): 10 IMPRESSION: 1. No acute cortically based infarct or acute intracranial hemorrhage identified. ASPECTS 10. 2. Generalized cerebral volume loss, and white matter changes  most commonly due to chronic small vessel disease. 3. These results were communicated to Dr. Lorraine Lax at 12:47 pm on 04/13/2019 by text page via the [facility] messaging system. Electronically Signed   By: Genevie Ann M.D.   On: 04/13/2019 12:47     ASSESSMENT AND PLAN  86 year old male presents with transient slurred speech and facial droop, symptoms completely resolved by the time patient arrived to CT scanner.   tPA not administered as NIH stroke scale 0, resolved symptoms.   Transient ischemic attack  Recommendations # ABCD2 score 4 # MRI of the brain without contrast #MRA Head and carotid doppler  #Transthoracic Echo  # Start patient on ASA 325mg  daily #Start or continue Atorvastatin 80 mg/other high intensity statin # BP goal: permissive HTN upto 220/120 mmHg ( 185/110 if patient has CHF, CKD) # HBAIC and Lipid profile # Telemetry monitoring # Frequent neuro checks # stroke swallow screen  Please page stroke NP  Or  PA  Or MD from 8am -4 pm  as this patient from this time will be  followed by the stroke.   You can look them up on [email or web address]  Password [facility]    Emaline Karnes [facility] Pager Number [phone]

## 2019-04-13 NOTE — ED Notes (Signed)
[facility] (Actived Code Stroke) called @ 1228-per Janett Billow, RN called by Levada Dy

## 2019-04-13 NOTE — ED Provider Notes (Signed)
[place] EMERGENCY DEPARTMENT Provider Note   CSN: [id number] Arrival date & time: 04/13/19  1214  An emergency department physician performed an initial assessment on this suspected stroke patient at 1230.  History Chief Complaint  Patient presents with  . Aphasia    Gabriel [name] is a 86 y.o. male history of hypertension, high cholesterol, here presenting with trouble speaking.  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  He then had acute onset of slurred speech.  He was able to finish his speech and came over for evaluation.  He states that his symptoms improved denies any focal weakness.  Code stroke was activated in triage since he is within the window for TPA. Patient not on blood thinners   The history is provided by the patient.       Past Medical History:  Diagnosis Date  . Gait disorder 02/04/2017  . High cholesterol   . Hypertension     Patient Active Problem List   Diagnosis Date Noted  . Gait disorder 02/04/2017    No past surgical history on file.     Family History  Problem Relation Age of Onset  . Heart attack Mother   . Heart disease Father   . Cancer - Prostate Father   . Cancer Brother        renal    Social History   Tobacco Use  . Smoking status: Never Smoker  . Smokeless tobacco: Never Used  Substance Use Topics  . Alcohol use: Yes    Comment: 1 beer per week  . Drug use: No    Home Medications Prior to Admission medications   Medication Sig Start Date End Date Taking? Authorizing Provider  aspirin 81 MG tablet Take 81 mg by mouth daily.    [provider]  aspirin-acetaminophen-caffeine (EXCEDRIN MIGRAINE) [phone] MG tablet Take 2 tablets by mouth daily as needed for headache.    [provider]  atorvastatin (LIPITOR) 20 MG tablet Take 20 mg by mouth daily.  04/26/17   [provider]  lisinopril (PRINIVIL,ZESTRIL) 10 MG tablet Take 10 mg by  mouth daily. 11/20/16   [provider]    Allergies    Patient has no known allergies.  Review of Systems   Review of Systems  Neurological: Positive for speech difficulty.  All other systems reviewed and are negative.   Physical Exam Updated Vital Signs BP (!) 163/84 (BP Location: Right Arm)   Pulse (!) 54   Resp 20   Wt 91.6 kg   SpO2 100%   BMI 28.98 kg/m   Physical Exam Vitals and nursing note reviewed.  HENT:     Head: Normocephalic.     Right Ear: Tympanic membrane normal.     Left Ear: Tympanic membrane normal.     Nose: Nose normal.     Mouth/Throat:     Mouth: Mucous membranes are moist.  Eyes:     Extraocular Movements: Extraocular movements intact.     Pupils: Pupils are equal, round, and reactive to light.  Cardiovascular:     Rate and Rhythm: Normal rate and regular rhythm.     Pulses: Normal pulses.  Pulmonary:     Effort: Pulmonary effort is normal.     Breath sounds: Normal breath sounds.  Abdominal:     General: Abdomen is flat.     Palpations: Abdomen is soft.  Musculoskeletal:  General: Normal range of motion.     Cervical back: Normal range of motion.  Skin:    General: Skin is warm.     Capillary Refill: Capillary refill takes less than 2 seconds.  Neurological:     Mental Status: He is alert.     Comments: CN 2- 12 intact, mild expressive aphasia. No obvious facial droop. Nl strength bilateral arms and legs   Psychiatric:        Mood and Affect: Mood normal.     ED Results / Procedures / Treatments   Labs (all labs ordered are listed, but only abnormal results are displayed) Labs Reviewed  I-STAT CHEM 8, ED - Abnormal; Notable for the following components:      Result Value   BUN 25 (*)    Hemoglobin 12.9 (*)    HCT 38.0 (*)    All other components within normal limits  PROTIME-INR  APTT  CBC  DIFFERENTIAL  COMPREHENSIVE METABOLIC PANEL  CBG MONITORING, ED  CBG MONITORING, ED    EKG None  Radiology CT  HEAD CODE STROKE WO CONTRAST  Result Date: 04/13/2019 CLINICAL DATA:  Code stroke. 86 year old male with right facial droop and aphasia. EXAM: CT HEAD WITHOUT CONTRAST TECHNIQUE: Contiguous axial images were obtained from the base of the skull through the vertex without intravenous contrast. COMPARISON:  None available. FINDINGS: Brain: Cerebral volume loss which appears fairly generalized and is felt to explain ventricular prominence on an ex vacuo basis. No midline shift, mass effect, or evidence of intracranial mass lesion. No acute intracranial hemorrhage identified. Patchy bilateral white matter hypodensity with some involvement of the deep white matter capsules. Deep gray nuclei are relatively spared. No superimposed acute cortically based infarct identified. Vascular: Extensive Calcified atherosclerosis at the skull base. No suspicious intracranial vascular hyperdensity. Skull: No acute osseous abnormality identified. Sinuses/Orbits: Visualized paranasal sinuses and mastoids are clear. Other: No acute orbit or scalp soft tissue finding. ASPECTS [facility] Stroke Program Early CT Score) Total score (0-10 with 10 being normal): 10 IMPRESSION: 1. No acute cortically based infarct or acute intracranial hemorrhage identified. ASPECTS 10. 2. Generalized cerebral volume loss, and white matter changes most commonly due to chronic small vessel disease. 3. These results were communicated to Dr. Lorraine Lax at 12:47 pm on 04/13/2019 by text page via the [facility] messaging system. Electronically Signed   By: Genevie Ann M.D.   On: 04/13/2019 12:47    Procedures Procedures (including critical care time)  Medications Ordered in ED Medications  sodium chloride flush (NS) 0.9 % injection 3 mL (has no administration in time range)    ED Course  I have reviewed the triage vital signs and the nursing notes.  Pertinent labs & imaging results that were available during my care of the patient were reviewed by me and considered in my  medical decision making (see chart for details).    MDM Rules/Calculators/A&P                      Gabriel [name] is a 86 y.o. male here presenting with trouble speaking.  An episode with trouble speaking around 9:30 AM. Code stroke activated in triage. Dr. Lorraine Lax saw patient. CT head unremarkable. He recommend inpatient workup for TIA vs small stroke. MRI brain ordered. Labs unremarkable. COVID sent.   Final Clinical Impression(s) / ED Diagnoses Final diagnoses:  None    Rx / DC Orders ED Discharge Orders    None  Drenda Freeze, MD 04/13/19 [phone]

## 2019-04-13 NOTE — Code Documentation (Signed)
Stroke Response Nurse Documentation Code Documentation  MERCER PEIFER is a 85 y.o. male arriving to [place] ED via Private Vehicle on 04/13/2019 with history of high cholesterol and HTN. Code stroke was activated by ED. Patient from home where he was LKW at 0930 where he reported starting to notice slurred speech. His wife came in at 10 and noted that he was having trouble with speech and expressing himself. On No antithrombotic. Stroke team at the bedside after activation. Patient to CT with team. NIHSS 0, see documentation for details and code stroke times. Patient has no deficits noted on exam. The following imaging was completed:  CT.  Patient is not a candidate for tPA due to being a TIA alert and all symptoms resolved. Returned to Trauma A. Placed on the cardiac monitor. Bedside handoff with ED RN Candie Chroman  Stroke Response RN

## 2019-04-13 NOTE — H&P (Signed)
History and Physical    Gabriel [name]:[id number] DOB: [birth year] DOA: 04/13/2019  PCP: [name], MD Consultants:  Jannifer Franklin - neurology; Delilah Shan - sports medicine Patient coming from:  Home - lives with wife; NOK: Wife, Oral Remache, [phone], [phone]  Chief Complaint: Code Stroke  HPI: Gabriel [name] is a 86 y.o. male with medical history significant of HTN and HLD presenting with Code Stroke. Patient reports that he felt well this AM but did not speak to anyone until he was at a meeting about insurance about 930. He had difficulty speaking - both aphasia and dysarthria.  His face felt a little numb.  His symptoms resolved and he went home.  Later in the morning, his wife noticed the same symptoms and sent him in for evaluation.  His symptoms have completely resolved.  No other symptoms noted.    ED Course:  TIA, presented as a Code Stroke.  Neurology recommends observation for complete evaluation.  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  Symptoms resolved.  Review of Systems: As per HPI; otherwise review of systems reviewed and negative.    Past Medical History:  Diagnosis Date  . Gait disorder 02/04/2017  . High cholesterol   . Hypertension     No past surgical history on file.  Social History   Socioeconomic History  . Marital status: Married    Spouse name: Not on file  . Number of children: 2  . Years of education: 62  . Highest education level: Not on file  Occupational History  . Occupation: Office  Tobacco Use  . Smoking status: Never Smoker  . Smokeless tobacco: Never Used  Substance and Sexual Activity  . Alcohol use: Yes    Comment: 1 beer per week  . Drug use: No  . Sexual activity: Not on file  Other Topics Concern  . Not on file  Social History Narrative   Lives with wife   Caffeine use: rare   Right handed    Social Determinants of Health   Financial Resource Strain:   . Difficulty of Paying Living Expenses: Not on file    Food Insecurity:   . Worried About Charity fundraiser in the Last Year: Not on file  . Ran Out of Food in the Last Year: Not on file  Transportation Needs:   . Lack of Transportation (Medical): Not on file  . Lack of Transportation (Non-Medical): Not on file  Physical Activity:   . Days of Exercise per Week: Not on file  . Minutes of Exercise per Session: Not on file  Stress:   . Feeling of Stress : Not on file  Social Connections:   . Frequency of Communication with Friends and Family: Not on file  . Frequency of Social Gatherings with Friends and Family: Not on file  . Attends Religious Services: Not on file  . Active Member of Clubs or Organizations: Not on file  . Attends Archivist Meetings: Not on file  . Marital Status: Not on file  Intimate Partner Violence:   . Fear of Current or Ex-Partner: Not on file  . Emotionally Abused: Not on file  . Physically Abused: Not on file  . Sexually Abused: Not on file    No Known Allergies  Family History  Problem Relation Age of Onset  . Heart attack Mother   . Heart disease Father   . Cancer - Prostate Father   . Cancer Brother  renal    Prior to Admission medications   Medication Sig Start Date End Date Taking? Authorizing Provider  aspirin 81 MG tablet Take 81 mg by mouth daily.    [provider]  aspirin-acetaminophen-caffeine (EXCEDRIN MIGRAINE) [phone] MG tablet Take 2 tablets by mouth daily as needed for headache.    [provider]  atorvastatin (LIPITOR) 20 MG tablet Take 20 mg by mouth daily.  04/26/17   [provider]  lisinopril (PRINIVIL,ZESTRIL) 10 MG tablet Take 10 mg by mouth daily. 11/20/16   [provider]    Physical Exam: Vitals:   04/13/19 1530 04/13/19 1545 04/13/19 1600 04/13/19 1615  BP:      Pulse:      Resp: (!) 21 16 17 18   TempSrc:      SpO2:      Weight:         . General:  Appears calm and comfortable and is NAD . Eyes:  PERRL,  EOMI, normal lids, iris . ENT:  grossly normal hearing, lips & tongue, mmm . Neck:  no LAD, masses or thyromegaly; no carotid bruits . Cardiovascular:  RRR, no m/r/g. No LE edema.  Marland Kitchen Respiratory:   CTA bilaterally with no wheezes/rales/rhonchi.  Normal respiratory effort. . Abdomen:  soft, NT, ND, NABS . Back:   normal alignment, no CVAT . Skin:  no rash or induration seen on limited exam . Musculoskeletal:  grossly normal tone BUE/BLE, good ROM, no bony abnormality . Lower extremity:  No LE edema.  Limited foot exam with no ulcerations.  2+ distal pulses. Marland Kitchen Psychiatric:  grossly normal mood and affect, speech fluent and appropriate, AOx3 . Neurologic:  CN 2-12 grossly intact, moves all extremities in coordinated fashion, sensation intact    Radiological Exams on Admission: CT HEAD CODE STROKE WO CONTRAST  Result Date: 04/13/2019 CLINICAL DATA:  Code stroke. 86 year old male with right facial droop and aphasia. EXAM: CT HEAD WITHOUT CONTRAST TECHNIQUE: Contiguous axial images were obtained from the base of the skull through the vertex without intravenous contrast. COMPARISON:  None available. FINDINGS: Brain: Cerebral volume loss which appears fairly generalized and is felt to explain ventricular prominence on an ex vacuo basis. No midline shift, mass effect, or evidence of intracranial mass lesion. No acute intracranial hemorrhage identified. Patchy bilateral white matter hypodensity with some involvement of the deep white matter capsules. Deep gray nuclei are relatively spared. No superimposed acute cortically based infarct identified. Vascular: Extensive Calcified atherosclerosis at the skull base. No suspicious intracranial vascular hyperdensity. Skull: No acute osseous abnormality identified. Sinuses/Orbits: Visualized paranasal sinuses and mastoids are clear. Other: No acute orbit or scalp soft tissue finding. ASPECTS [facility] Stroke Program Early CT Score) Total score (0-10 with 10 being  normal): 10 IMPRESSION: 1. No acute cortically based infarct or acute intracranial hemorrhage identified. ASPECTS 10. 2. Generalized cerebral volume loss, and white matter changes most commonly due to chronic small vessel disease. 3. These results were communicated to Dr. Lorraine Lax at 12:47 pm on 04/13/2019 by text page via the [facility] messaging system. Electronically Signed   By: Genevie Ann M.D.   On: 04/13/2019 12:47    EKG: Independently reviewed.  NSR with rate 60; nonspecific ST changes with no evidence of acute ischemia   Labs on Admission: I have personally reviewed the available labs and imaging studies at the time of the admission.  Pertinent labs:   Unremarkable CMP Normal CBC INR 1.0   Assessment/Plan Principal Problem:   TIA (transient  ischemic attack) Active Problems:   Essential hypertension   Dyslipidemia    TIA -Patient is presenting with two episodes of transient dysarthria/aphasia; symptoms have resolved -Concerning for TIA/CVA -Will place in observation status for CVA/TIA evaluation -Telemetry monitoring -MRI/MRA -Carotid dopplers -Echo -Risk stratification with FLP, A1c; will also check TSH  -ASA daily -Neurology consult -PT/OT/ST/Nutrition Consults  HTN -Allow permissive HTN for now -Treat BP only if >220/120, and then with goal of 15% reduction -Hold Lisinopril and HCTZ and plan to restart in 48-72 hours   HLD -Check FLP -Continue Lipitor but increase to 40 mg daily     Note: This patient has been tested and is pending for the novel coronavirus COVID-19.     DVT prophylaxis:  Lovenox  Code Status: DNR - confirmed with patient Family Communication: None present; I spoke with his wife by telephone Disposition Plan:  Home once clinically improved Consults called: Neurology; PT/OT/ST//Nutrition  Admission status: It is my clinical opinion that referral for OBSERVATION is reasonable and necessary in this patient based on the above information provided. The  aforementioned taken together are felt to place the patient at high risk for further clinical deterioration. However it is anticipated that the patient may be medically stable for discharge from the hospital within 24 to 48 hours.     Karmen Bongo MD [facility]   How to contact the [facility] Attending or Consulting provider [place] or covering provider during after hours [place], for this patient?  1. Check the care team in [facility] and look for a) attending/consulting [facility] provider listed and b) the [facility] team listed 2. Log into [email or web address] and use [facility] universal password to access. If you do not have the password, please contact the hospital operator. 3. Locate the [facility] provider you are looking for under [facility] and page to a number that you can be directly reached. 4. If you still have difficulty reaching the provider, please page the [facility] (Director on Call) for the Hospitalists listed on amion for assistance.   04/13/2019, 5:56 PM

## 2019-04-13 NOTE — ED Notes (Signed)
Patient transported to MRI 

## 2019-04-13 NOTE — ED Notes (Signed)
Pt returned from MRI °

## 2019-04-14 ENCOUNTER — Observation Stay (HOSPITAL_BASED_OUTPATIENT_CLINIC_OR_DEPARTMENT_OTHER): Payer: Medicare Other

## 2019-04-14 ENCOUNTER — Other Ambulatory Visit: Payer: Self-pay

## 2019-04-14 ENCOUNTER — Encounter (HOSPITAL_COMMUNITY): Payer: Self-pay | Admitting: Internal Medicine

## 2019-04-14 ENCOUNTER — Observation Stay (HOSPITAL_COMMUNITY): Payer: Medicare Other

## 2019-04-14 ENCOUNTER — Encounter (HOSPITAL_COMMUNITY): Payer: Medicare Other

## 2019-04-14 DIAGNOSIS — G459 Transient cerebral ischemic attack, unspecified: Secondary | ICD-10-CM | POA: Diagnosis not present

## 2019-04-14 DIAGNOSIS — R4781 Slurred speech: Secondary | ICD-10-CM | POA: Diagnosis not present

## 2019-04-14 DIAGNOSIS — I10 Essential (primary) hypertension: Secondary | ICD-10-CM

## 2019-04-14 DIAGNOSIS — E785 Hyperlipidemia, unspecified: Secondary | ICD-10-CM

## 2019-04-14 LAB — HEMOGLOBIN A1C
Hgb A1c MFr Bld: 5.4 % (ref 4.8–5.6)
Mean Plasma Glucose: 108.28 mg/dL

## 2019-04-14 LAB — ECHOCARDIOGRAM COMPLETE
Height: 70 in
Weight: 3155.2 oz

## 2019-04-14 LAB — LIPID PANEL
Cholesterol: 163 mg/dL (ref 0–200)
HDL: 33 mg/dL — ABNORMAL LOW (ref >40)
LDL Cholesterol: 74 mg/dL (ref 0–99)
Total CHOL/HDL Ratio: 4.9 RATIO
Triglycerides: 280 mg/dL — ABNORMAL HIGH (ref <150)
VLDL: 56 mg/dL — ABNORMAL HIGH (ref 0–40)

## 2019-04-14 MED ORDER — STUDY - INVESTIGATIONAL MEDICATION: 99 refills | Status: DC

## 2019-04-14 MED ORDER — ADULT MULTIVITAMIN W/MINERALS CH
1.0000 | ORAL_TABLET | Freq: Every day | ORAL | Status: DC
  Administered 2019-04-14: 1 via ORAL
  Filled 2019-04-14: qty 1

## 2019-04-14 MED ORDER — PERFLUTREN LIPID MICROSPHERE
1.0000 mL | INTRAVENOUS | Status: AC | PRN
  Administered 2019-04-14: 6 mL via INTRAVENOUS
  Filled 2019-04-14: qty 10

## 2019-04-14 MED ORDER — ATORVASTATIN CALCIUM 40 MG PO TABS: 40.0000 mg | ORAL_TABLET | Freq: Every day | ORAL | 1 refills | Status: DC

## 2019-04-14 MED ORDER — ENSURE MAX PROTEIN PO LIQD: 11.0000 [oz_av] | Freq: Every day | ORAL | Status: DC

## 2019-04-14 NOTE — Evaluation (Signed)
Physical Therapy Evaluation Patient Details Name: Gabriel [name] MRN: [id number] DOB: 01-10-1931 Today's Date: 04/14/2019   History of Present Illness  Patient is a 86 year old male with medical history significant of HTN and HLD presenting with difficulty speaking - both aphasia and dysarthria, upon arrival to ED symptoms resolved. Patient MRI negative for acute infarcts. PMH HTN, HLD  Clinical Impression   Patient received up with urinal, pleasant and willing to participate in PT today. Did require cues and intervention for safety as he was trying to lean on rolling table to bend down and wipe up urine from floor. Otherwise able to perform functional transfers, gait approximately 125f with no device and mechanics as below. Education completed regarding TIA, also warning signs to look for and immediately return to ED for including sudden changes in balance/gait, speech and/or vision changes, or sudden strength loss, patient voices understanding. He was left up in the chair with all needs met this morning. He is not in need of skilled PT services moving forward- PT signing off, thank you for the referral.     Follow Up Recommendations No PT follow up    Equipment Recommendations  None recommended by PT    Recommendations for Other Services       Precautions / Restrictions Precautions Precautions: Fall Restrictions Weight Bearing Restrictions: No      Mobility  Bed Mobility Overal bed mobility: Modified Independent             General bed mobility comments: OOB in chair  Transfers Overall transfer level: Independent Equipment used: None             General transfer comment: fully independent for line management, PT assisted with line management  Ambulation/Gait Ambulation/Gait assistance: Independent Gait Distance (Feet): 150 Feet Assistive device: None Gait Pattern/deviations: Step-through pattern;Trendelenburg Gait velocity: decreased but functional   General  Gait Details: gait generally WNL, slow but steady with no significant deviations or LOB  Stairs            Wheelchair Mobility    Modified Rankin (Stroke Patients Only)       Balance Overall balance assessment: Independent                                           Pertinent Vitals/Pain Pain Assessment: No/denies pain    Home Living Family/patient expects to be discharged to:: Private residence Living Arrangements: Spouse/significant other Available Help at Discharge: Family;Available 24 hours/day Type of Home: House Home Access: Stairs to enter   [employer] Steps: 2 then 1 Home Layout: One level Home Equipment: None      Prior Function Level of Independence: Independent         Comments: Patient drives, fully I     Hand Dominance   Dominant Hand: Right    Extremity/Trunk Assessment   Upper Extremity Assessment Upper Extremity Assessment: Defer to OT evaluation    Lower Extremity Assessment Lower Extremity Assessment: Overall WFL for tasks assessed    Cervical / Trunk Assessment Cervical / Trunk Assessment: Normal  Communication   Communication: No difficulties  Cognition Arousal/Alertness: Awake/alert Behavior During Therapy: WFL for tasks assessed/performed Overall Cognitive Status: Within Functional Limits for tasks assessed  General Comments General comments (skin integrity, edema, etc.): VSS on room air    Exercises     Assessment/Plan    PT Assessment Patent does not need any further PT services  PT Problem List         PT Treatment Interventions      PT Goals (Current goals can be found in the Care Plan section)  Acute Rehab PT Goals Patient Stated Goal: go home PT Goal Formulation: With patient Time For Goal Achievement: 04/28/19 Potential to Achieve Goals: Good    Frequency     Barriers to discharge        Co-evaluation                AM-PAC PT "6 Clicks" Mobility  Outcome Measure Help needed turning from your back to your side while in a flat bed without using bedrails?: None Help needed moving from lying on your back to sitting on the side of a flat bed without using bedrails?: None Help needed moving to and from a bed to a chair (including a wheelchair)?: None Help needed standing up from a chair using your arms (e.g., wheelchair or bedside chair)?: None Help needed to walk in hospital room?: None Help needed climbing 3-5 steps with a railing? : A Little 6 Click Score: 23    End of Session   Activity Tolerance: Patient tolerated treatment well Patient left: in chair;with call bell/phone within reach   PT Visit Diagnosis: Other symptoms and signs involving the nervous system (R29.898)    Time: 7371-0626 PT Time Calculation (min) (ACUTE ONLY): 16 min   Charges:   PT Evaluation $PT Eval Low Complexity: 1 Low          Windell Norfolk, DPT, PN1   Supplemental Physical Therapist [place]    Pager [phone] Acute Rehab Office [phone]

## 2019-04-14 NOTE — Progress Notes (Signed)
Initial Nutrition Assessment  RD working remotely.   DOCUMENTATION CODES:   Not applicable  INTERVENTION:  - will order Ensure Max once/day, each supplement provides 150 kcal and 30 grams protein. - will order Magic Cup BID with meals, each supplement provides 290 kcal and 9 grams of protein. - will order daily multivitamin with minerals.    NUTRITION DIAGNOSIS:   Increased nutrient needs related to acute illness as evidenced by estimated needs.  GOAL:   Patient will meet greater than or equal to 90% of their needs  MONITOR:   PO intake, Supplement acceptance, Labs, Weight trends  REASON FOR ASSESSMENT:   Consult Other (Comment)(TIA)  ASSESSMENT:   86 y.o. male with medical history significant of HTN and HLD presenting with Code Stroke. Patient reports that he felt well this AM (1/7) but did not speak to anyone until he was at a meeting at about 0930. He had difficulty speaking - both aphasia and dysarthria.  His face felt a little numb.  His symptoms resolved and he went home.  Later in the morning, his wife noticed the same symptoms and sent him in for evaluation.  His symptoms have completely resolved.  No other symptoms noted.  No intakes documented since admission. Patient reports good appetite now and PTA. Despite some previous facial numbness, he is no longer experiencing any symptoms and has had no difficulties with chewing or swallowing. He denies noticing any recent weight changes.  Per chart review, current weight is 197 lb and PTA the recently recorded weight was on 12/28/17 when he weighed 201 lb. This indicates 4 lb weight loss in 15 months.   Per notes: - s/p TIA--symptoms have resolved; Neuro consulted   Labs reviewed; CBG: 91 mg/dl, BUN: 25 mg/dl. Medications reviewed.    NUTRITION - FOCUSED PHYSICAL EXAM:  unable to complete at this time.   Diet Order:   Diet Order            Diet Heart Room service appropriate? Yes; Fluid consistency: Thin  Diet  effective ____              EDUCATION NEEDS:   No education needs have been identified at this time  Skin:  Skin Assessment: Reviewed RN Assessment  Last BM:  1/6  Height:   Ht Readings from Last 1 Encounters:  04/14/19 5\' 10"  (1.778 m)    Weight:   Wt Readings from Last 1 Encounters:  04/14/19 89.4 kg    Ideal Body Weight:  75.4 kg  BMI:  Body mass index is 28.3 kg/m.  Estimated Nutritional Needs:   Kcal:  1785-2050 kcal  Protein:  90-105 grams  Fluid:  >/= 2 L/day      Jarome Matin, MS, RD, LDN, [facility] Inpatient Clinical Dietitian Pager # [phone] After hours/weekend pager # [phone]

## 2019-04-14 NOTE — Discharge Summary (Signed)
Gabriel [name] QAS:[id number] DOB: 18-Nov-1930 DOA: 04/13/2019  PCP: [name], MD  Admit date: 04/13/2019 Discharge date: 04/14/2019  Admitted From: Home Disposition: Home  Recommendations for Outpatient Follow-up:  1. Follow up with PCP in 1-2 weeks 2. Please obtain BMP/CBC in one week 3. New medications: On Axiomatic study ( PI Dr. Lambert Keto, plavix and placebo vials provided on discharge from pharmacy.  Will follow up with stroke research clinic as scheduled 4. Please follow up on the following pending results:  Home Health: No Equipment/Devices: None  Discharge Condition: Stable CODE STATUS: DNR Diet recommendation: Heart Healthy    Brief/Interim Summary: History of present illness:  Gabriel [name] is a 86 y.o. year old male with medical history significant for HTN, HLD who presented on 04/13/2019 with to intermittent episodes of sudden onset difficulty in speaking and associated right facial numbness and facial droop within 3 hours.  First episode occurred while trying to give a presentation at work before resolving on its own.  He reoccurred about 1 hour later in the presence of his wife who noted right facial droop.  His symptoms resolved by the time he reached the ER.  Code stroke was called, MRI brain was negative for acute stroke.  CTA showed mild calcification of aortic arch and left carotid but no large vessel stenosis or occlusion.  TTE with normal EF. Patient was seen by neurology and signed consent to participate in [facility] Axiomatic stroke prevention trial. Remaining hospital course addressed in problem based format below:   Hospital Course:   Left brain TIA Symptoms completely resolved with no recurrence during hospital stay.  MRI negative for acute infarct, MRA neck unremarkable.  CTA head and neck show ICA atherosclerosis with no significant stenosis.  TTE with no definite cardiac source of embolism and preserved EF.  LDL 74, A1c 5.4. -Atorvastatin increased to 40  mg -No antibiotics prescribed on discharge, patient now enrolled in [facility]MS axiomatic trial and discharged home with study drugs from pharmacy No therapy needs on discharge  Hypertension, remained at goal  Hyperlipidemia -Lipitor increased from 20 mg to 40 mg on discharge.  Consultations:  Neurology  Procedures/Studies: TTE, 1/8 Subjective: Feels well Has had no recurrent symptoms Discharge Exam: Vitals:   04/14/19 0945 04/14/19 1414  BP: (!) 174/77 129/66  Pulse: (!) 52 69  Resp:  20  Temp:  98.2 F (36.8 C)  SpO2: 100% 95%   Vitals:   04/14/19 0527 04/14/19 0555 04/14/19 0945 04/14/19 1414  BP: (!) 148/71 (!) 169/81 (!) 174/77 129/66  Pulse: 60 (!) 45 (!) 52 69  Resp: 14 19  20   Temp: (!) 97.4 F (36.3 C)   98.2 F (36.8 C)  TempSrc: Oral   Oral  SpO2:  96% 100% 95%  Weight:      Height:        General: Elderly male, sitting in bedside chair, no apparent distress Eyes: EOMI, anicteric ENT: Oral Mucosa clear and moist Cardiovascular: regular rate and rhythm, no murmurs, rubs or gallops, no edema, Respiratory: Normal respiratory effort, lungs clear to auscultation bilaterally Abdomen: soft, non-distended, non-tender, normal bowel sounds Skin: No Rash Neurologic: 5 out of 5 strength in upper and lower bilateral extremities without any focal deficits.Mental status AAOx3, speech normal, Psychiatric:Appropriate affect, and mood  Discharge Diagnoses:  Principal Problem:   TIA (transient ischemic attack) Active Problems:   Essential hypertension   Dyslipidemia    Discharge Instructions  Discharge Instructions    Diet - low sodium  heart healthy   Complete by: As directed    Diet - low sodium heart healthy   Complete by: As directed    Increase activity slowly   Complete by: As directed    Increase activity slowly   Complete by: As directed      Allergies as of 04/14/2019   No Known Allergies     Medication List    TAKE these medications    atorvastatin 40 MG tablet Commonly known as: LIPITOR Take 1 tablet (40 mg total) by mouth daily. Start taking on: April 15, 2019 What changed:   medication strength  how much to take   hydrochlorothiazide 12.5 MG capsule Commonly known as: MICROZIDE Take 12.5 mg by mouth daily.   Investigational - Study Medication Study name: AXIOMATIC STUDY Additional study details: clopidogrel 75 mg (PI-Sethi)   Investigational - Study Medication Study name:AXIOMATIC STUDY Additional study details: [id number] or placebo ( PI-Sethi)   Investigational - Study Medication Study name: AXIOMATIC STUDY Additional study details: aspiring 100 mg ( PI-Sethi)   lisinopril 10 MG tablet Commonly known as: ZESTRIL Take 10 mg by mouth daily.       No Known Allergies      The results of significant diagnostics from this hospitalization (including imaging, microbiology, ancillary and laboratory) are listed below for reference.     Microbiology: Recent Results (from the past 240 hour(s))  SARS CORONAVIRUS 2 (TAT 6-24 HRS) Nasopharyngeal Nasopharyngeal Swab     Status: None   Collection Time: 04/13/19  2:22 PM   Specimen: Nasopharyngeal Swab  Result Value Ref Range Status   SARS Coronavirus 2 NEGATIVE NEGATIVE Final    Comment: (NOTE) SARS-CoV-2 target nucleic acids are NOT DETECTED. The SARS-CoV-2 RNA is generally detectable in upper and lower respiratory specimens during the acute phase of infection. Negative results do not preclude SARS-CoV-2 infection, do not rule out co-infections with other pathogens, and should not be used as the sole basis for treatment or other patient management decisions. Negative results must be combined with clinical observations, patient history, and epidemiological information. The expected result is Negative. Fact Sheet for Patients: SugarRoll.be Fact Sheet for Healthcare  Providers: https://www.woods-mathews.com/ This test is not yet approved or cleared by the [place] FDA and  has been authorized for detection and/or diagnosis of SARS-CoV-2 by FDA under an Emergency Use Authorization (EUA). This EUA will remain  in effect (meaning this test can be used) for the duration of the COVID-19 declaration under Section 56 4(b)(1) of the Act, 21 U.S.C. section 360bbb-3(b)(1), unless the authorization is terminated or revoked sooner. Performed at [facility], [place] [address], [place], [place] [address]      Labs: BNP (last 3 results) No results for input(s): BNP in the last 8760 hours. Basic Metabolic Panel: Recent Labs  Lab 04/13/19 1223 04/13/19 1235 04/13/19 1632  NA 136 139  --   K 4.2 4.1  --   CL 102 104  --   CO2 25  --   --   GLUCOSE 98 93  --   BUN 23 25*  --   CREATININE 1.11 1.20  --   CALCIUM 9.0  --   --   MG  --   --  2.2  PHOS  --   --  3.9   Liver Function Tests: Recent Labs  Lab 04/13/19 1223  AST 24  ALT 20  ALKPHOS 77  BILITOT 0.4  PROT 7.0  ALBUMIN 4.3   No  results for input(s): LIPASE, AMYLASE in the last 168 hours. No results for input(s): AMMONIA in the last 168 hours. CBC: Recent Labs  Lab 04/13/19 1223 04/13/19 1235  WBC 7.4  --   NEUTROABS 3.8  --   HGB 13.0 12.9*  HCT 40.1 38.0*  MCV 91.1  --   PLT 184  --    Cardiac Enzymes: No results for input(s): CKTOTAL, CKMB, CKMBINDEX, TROPONINI in the last 168 hours. BNP: Invalid input(s): POCBNP CBG: Recent Labs  Lab 04/13/19 1220  GLUCAP 91   D-Dimer No results for input(s): DDIMER in the last 72 hours. Hgb A1c Recent Labs    04/14/19 0328  HGBA1C 5.4   Lipid Profile Recent Labs    04/14/19 0328  CHOL 163  HDL 33*  LDLCALC 74  TRIG 280*  CHOLHDL 4.9   Thyroid function studies Recent Labs    04/13/19 2145  TSH 1.854   Anemia work up No results for input(s): VITAMINB12, FOLATE, FERRITIN, TIBC, IRON, RETICCTPCT  in the last 72 hours. Urinalysis No results found for: COLORURINE, APPEARANCEUR, [place], [place], [place], [place], [place], , PROTEINUR, UROBILINOGEN, NITRITE, LEUKOCYTESUR Sepsis Labs Invalid input(s): PROCALCITONIN,  WBC,  LACTICIDVEN Microbiology Recent Results (from the past 240 hour(s))  SARS CORONAVIRUS 2 (TAT 6-24 HRS) Nasopharyngeal Nasopharyngeal Swab     Status: None   Collection Time: 04/13/19  2:22 PM   Specimen: Nasopharyngeal Swab  Result Value Ref Range Status   SARS Coronavirus 2 NEGATIVE NEGATIVE Final    Comment: (NOTE) SARS-CoV-2 target nucleic acids are NOT DETECTED. The SARS-CoV-2 RNA is generally detectable in upper and lower respiratory specimens during the acute phase of infection. Negative results do not preclude SARS-CoV-2 infection, do not rule out co-infections with other pathogens, and should not be used as the sole basis for treatment or other patient management decisions. Negative results must be combined with clinical observations, patient history, and epidemiological information. The expected result is Negative. Fact Sheet for Patients: SugarRoll.be Fact Sheet for Healthcare Providers: https://www.woods-mathews.com/ This test is not yet approved or cleared by the [place] FDA and  has been authorized for detection and/or diagnosis of SARS-CoV-2 by FDA under an Emergency Use Authorization (EUA). This EUA will remain  in effect (meaning this test can be used) for the duration of the COVID-19 declaration under Section 56 4(b)(1) of the Act, 21 U.S.C. section 360bbb-3(b)(1), unless the authorization is terminated or revoked sooner. Performed at [facility], [place] [address], [place], [place] [address]      Time coordinating discharge: Over 30 minutes  SIGNED:   Desiree Hane, MD  [facility] 04/14/2019, 8:06 PM Pager   If 7PM-7AM, please contact  night-coverage [email or web address] Password [email or web address]

## 2019-04-14 NOTE — Discharge Instructions (Addendum)
Transient Ischemic Attack  A transient ischemic attack (TIA) is a "warning stroke" that causes stroke-like symptoms that go away quickly. A TIA does not cause lasting damage to the brain. But having a TIA is a sign that you may be at risk for a stroke. Lifestyle changes and medical treatments can help prevent a stroke. It is important to know the symptoms of a TIA and what to do. Get help right away, even if your symptoms go away. The symptoms of a TIA are the same as those of a stroke. They can happen fast, and they usually go away within minutes or hours. They can include:  Weakness or loss of feeling in your face, arm, or leg. This often happens on one side of your body.  Trouble walking.  Trouble moving your arms or legs.  Trouble talking or understanding what people are saying.  Trouble seeing.  Seeing two of one object (double vision).  Feeling dizzy.  Feeling confused.  Loss of balance or coordination.  Feeling sick to your stomach (nauseous) and throwing up (vomiting).  A very bad headache for no reason. What increases the risk? Certain things may make you more likely to have a TIA. Some of these are things that you can change, such as:  Being very overweight (obese).  Using products that contain nicotine or tobacco, such as cigarettes and e-cigarettes.  Taking birth control pills.  Not being active.  Drinking too much alcohol.  Using drugs. Other risk factors include:  Having an irregular heartbeat (atrial fibrillation).  Being African American or Hispanic.  Having had blood clots, stroke, TIA, or heart attack in the past.  Being a woman with a history of high blood pressure in pregnancy (preeclampsia).  Being over the age of 60.  Being male.  Having family history of stroke.  Having the following diseases or conditions: ? High blood pressure. ? High cholesterol. ? Diabetes. ? Heart disease. ? Sickle cell disease. ? Sleep apnea. ? Migraine  headache. ? Long-term (chronic) diseases that cause soreness and swelling (inflammation). ? Disorders that affect how your blood clots. Follow these instructions at home: Medicines   Take over-the-counter and prescription medicines only as told by your doctor.  If you were told to take aspirin or another medicine to thin your blood, take it exactly as told by your doctor. ? Taking too much of the medicine can cause bleeding. ? Taking too little of the medicine may not work to treat the problem. Eating and drinking   Eat 5 or more servings of fruits and vegetables each day.  Follow instructions from your doctor about your diet. You may need to follow a certain diet to help lower your risk of having a stroke. You may need to: ? Eat a diet that is low in fat and salt. ? Eat foods that contain a lot of fiber. ? Limit the amount of carbohydrates and sugar in your diet.  Limit alcohol intake to 1 drink a day for nonpregnant women and 2 drinks a day for men. One drink equals 12 oz of beer, 5 oz of wine, or 1 oz of hard liquor. General instructions  Keep a healthy weight.  Stay active. Try to get at least 30 minutes of activity on all or most days.  Find out if you have a condition called sleep apnea. Get treatment if needed.  Do not use any products that contain nicotine or tobacco, such as cigarettes and e-cigarettes. If you need help quitting,   ask your doctor.  Do not abuse drugs.  Keep all follow-up visits as told by your doctor. This is important. Get help right away if:  You have any signs of stroke. "BE FAST" is an easy way to remember the main warning signs: ? B - Balance. Signs are dizziness, sudden trouble walking, or loss of balance. ? E - Eyes. Signs are trouble seeing or a sudden change in how you see. ? F - Face. Signs are sudden weakness or loss of feeling of the face, or the face or eyelid drooping on one side. ? A - Arms. Signs are weakness or loss of feeling in an  arm. This happens suddenly and usually on one side of the body. ? S - Speech. Signs are sudden trouble speaking, slurred speech, or trouble understanding what people say. ? T - Time. Time to call emergency services. Write down what time symptoms started.  You have other signs of stroke, such as: ? A sudden, very bad headache with no known cause. ? Feeling sick to your stomach (nausea). ? Throwing up (vomiting). ? Jerky movements that you cannot control (seizure). These symptoms may be an emergency. Do not wait to see if the symptoms will go away. Get medical help right away. Call your local emergency services (911 in the [place] Do not drive yourself to the hospital. Summary  A transient ischemic attack (TIA) is a "warning stroke" that causes stroke-like symptoms that go away quickly.  A TIA is a medical emergency. Get help right away, even if your symptoms go away.  A TIA does not cause lasting damage to the brain.  Having a TIA is a sign that you may be at risk for a stroke. Lifestyle changes and medical treatments can help prevent a stroke. This information is not intended to replace advice given to you by your health care provider. Make sure you discuss any questions you have with your health care provider. Document Revised: 12/17/2017 Document Reviewed: 06/24/2016 Elsevier Patient Education  2020 Elsevier Inc.  

## 2019-04-14 NOTE — Progress Notes (Signed)
*   Echocardiogram 2D Echocardiogram has been performed.  Gabriel [name] 04/14/2019, 10:48 AM

## 2019-04-14 NOTE — Progress Notes (Signed)
SLP Cancellation Note  Patient Details Name: Gabriel [name] MRN: [id number] DOB: 19-Jun-1930   Cancelled treatment:       Reason Eval/Treat Not Completed: SLP screened, no needs identified, will sign off. Pt and wife indicate pt speech and language have returned to baseline. They were encouraged to notify PCP if difficulties arise after return to normal routines. [place], [facility], CCC-SLP Speech Language Pathologist Office: [phone] Pager: [phone]  Shonna Chock 04/14/2019, 4:00 PM

## 2019-04-14 NOTE — Evaluation (Signed)
Occupational Therapy Evaluation Patient Details Name: Gabriel [name] MRN: [id number] DOB: [birth year] Today's Date: 04/14/2019    History of Present Illness Patient is a 86 year old male with medical history significant of HTN and HLD presenting with difficulty speaking - both aphasia and dysarthria, upon arrival to ED symptoms resolved. Patient MRI negative for acute infarcts.    Clinical Impression   Patient is an 86 year old male that lives with his spouse in a townhouse, single level with 2 steps then 1 step to enter. Patient is independent at baseline and currently displays no deficits with self care, functional mobility or transfers. Will discontinue acute OT services at this time.    Follow Up Recommendations  No OT follow up    Equipment Recommendations  None recommended by OT       Precautions / Restrictions Precautions Precautions: Fall Restrictions Weight Bearing Restrictions: No      Mobility Bed Mobility Overal bed mobility: Modified Independent                Transfers Overall transfer level: Independent Equipment used: None             General transfer comment: pt perform sit <> stand without physical assist, OT present to assist with line management    Balance Overall balance assessment: Independent                                         ADL either performed or assessed with clinical judgement   ADL Overall ADL's : Independent                                       General ADL Comments: patient able to perform LB bathing, doff/don clean underwear in standing with no loss of balance. stood sink side to brush his teeth/wash his face without difficulty     Vision Baseline Vision/History: Wears glasses Wears Glasses: Reading only Patient Visual Report: No change from baseline Vision Assessment?: No apparent visual deficits            Pertinent Vitals/Pain Pain Assessment: No/denies pain     Hand  Dominance Right   Extremity/Trunk Assessment Upper Extremity Assessment Upper Extremity Assessment: Overall WFL for tasks assessed   Lower Extremity Assessment Lower Extremity Assessment: Defer to PT evaluation   Cervical / Trunk Assessment Cervical / Trunk Assessment: Normal   Communication Communication Communication: No difficulties   Cognition Arousal/Alertness: Awake/alert Behavior During Therapy: WFL for tasks assessed/performed Overall Cognitive Status: Within Functional Limits for tasks assessed                                                Home Living Family/patient expects to be discharged to:: Private residence Living Arrangements: Spouse/significant other Available Help at Discharge: Family;Available 24 hours/day Type of Home: House Home Access: Stairs to enter [employer] of Steps: 2 then 1   Home Layout: One level     Bathroom Shower/Tub: Occupational psychologist: Standard     Home Equipment: None          Prior Functioning/Environment Level of Independence: Independent  Comments: Patient drives, fully I        OT Problem List: Impaired balance (sitting and/or standing)       AM-PAC OT "6 Clicks" Daily Activity     Outcome Measure Help from another person eating meals?: None Help from another person taking care of personal grooming?: None Help from another person toileting, which includes using toliet, bedpan, or urinal?: None Help from another person bathing (including washing, rinsing, drying)?: None Help from another person to put on and taking off regular upper body clothing?: None Help from another person to put on and taking off regular lower body clothing?: None 6 Click Score: 24   End of Session Nurse Communication: Mobility status  Activity Tolerance: Patient tolerated treatment well Patient left: in chair;with call bell/phone within reach  OT Visit Diagnosis: Unsteadiness on feet  (R26.81)                Time: 4920-1007 OT Time Calculation (min): 28 min Charges:  OT General Charges $OT Visit: 1 Visit OT Evaluation $OT Eval Moderate Complexity: 1 Mod  [facility] OT office: [place] 04/14/2019, 8:11 AM

## 2019-04-14 NOTE — Progress Notes (Signed)
STROKE TEAM PROGRESS NOTE   INTERVAL HISTORY I personally reviewed history of presenting illness in detail with the patient and his wife whom I spoke to over the phone, electronic medical records and imaging films in PACS.  He presented yesterday morning at 930 with sudden onset of expressive speech difficulties right facial droop and slurred speech lasting about an hour and symptoms resolved by the time he reached the emergency room.  MRI scan of the brain is negative for acute stroke.  CT angiogram shows mild calcification in the aortic arch and left carotid siphon but no large vessel stenosis or occlusion.  Echocardiogram shows normal ejection fraction.  Patient signed consent and is participating in the [place] stroke prevention trial  Vitals:   04/14/19 0342 04/14/19 0527 04/14/19 0555 04/14/19 0945  BP: (!) 150/80 (!) 148/71 (!) 169/81 (!) 174/77  Pulse: (!) 54 60 (!) 45 (!) 52  Resp: 17 14 19    Temp:  (!) 97.4 F (36.3 C)    TempSrc:  Oral    SpO2: 96%  96% 100%  Weight:      Height:        CBC:  Recent Labs  Lab 04/13/19 1223 04/13/19 1235  WBC 7.4  --   NEUTROABS 3.8  --   HGB 13.0 12.9*  HCT 40.1 38.0*  MCV 91.1  --   PLT 184  --     Basic Metabolic Panel:  Recent Labs  Lab 04/13/19 1223 04/13/19 1235 04/13/19 1632  NA 136 139  --   K 4.2 4.1  --   CL 102 104  --   CO2 25  --   --   GLUCOSE 98 93  --   BUN 23 25*  --   CREATININE 1.11 1.20  --   CALCIUM 9.0  --   --   MG  --   --  2.2  PHOS  --   --  3.9   Lipid Panel:     Component Value Date/Time   CHOL 163 04/14/2019 0328   TRIG 280 (H) 04/14/2019 0328   HDL 33 (L) 04/14/2019 0328   CHOLHDL 4.9 04/14/2019 0328   VLDL 56 (H) 04/14/2019 0328   LDLCALC 74 04/14/2019 0328   HgbA1c:  Lab Results  Component Value Date   HGBA1C 5.4 04/14/2019   Urine Drug Screen:     Component Value Date/Time   LABOPIA NONE DETECTED 04/13/2019 1955   COCAINSCRNUR NONE DETECTED 04/13/2019 1955   LABBENZ  NONE DETECTED 04/13/2019 1955   AMPHETMU NONE DETECTED 04/13/2019 1955   THCU NONE DETECTED 04/13/2019 1955   LABBARB NONE DETECTED 04/13/2019 1955    Alcohol Level No results found for: ETH  IMAGING past 48 hours CT ANGIO HEAD W OR WO CONTRAST  Result Date: 04/13/2019 CLINICAL DATA:  Right facial droop and aphasia. No acute finding by MRI. EXAM: CT ANGIOGRAPHY HEAD AND NECK TECHNIQUE: Multidetector CT imaging of the head and neck was performed using the standard protocol during bolus administration of intravenous contrast. Multiplanar CT image reconstructions and MIPs were obtained to evaluate the vascular anatomy. Carotid stenosis measurements (when applicable) are obtained utilizing NASCET criteria, using the distal internal carotid diameter as the denominator. CONTRAST:  112mL OMNIPAQUE IOHEXOL 350 MG/ML SOLN COMPARISON:  CT and MRI same day FINDINGS: CTA NECK FINDINGS Aortic arch: Aortic atherosclerosis. No aneurysm or dissection. Branching pattern is normal without origin stenosis. Right carotid system: Common carotid artery widely patent to the bifurcation region.  Calcified plaque at the carotid bifurcation and ICA bulb. Minimal ICA diameter 3 mm. Compared to a more distal cervical ICA diameter of 4 mm, this indicates a 25% stenosis. No abnormality seen in the more distal cervical ICA. Left carotid system: Common carotid artery widely patent to the bifurcation. No soft or calcified plaque at the carotid bifurcation or ICA bulb. No stenosis. Cervical ICA shows some calcified plaque and mild wall irregularity at the skull base level. No stenosis however. Vertebral arteries: Both vertebral artery origins are widely patent. Both vertebral arteries appear normal through the cervical region to the foramen magnum. Skeleton: Ordinary cervical spondylosis. Other neck: No mass or lymphadenopathy. Upper chest: Normal Review of the MIP images confirms the above findings CTA HEAD FINDINGS Anterior circulation:  Both internal carotid arteries are patent through the skull base and siphon regions. There is ordinary siphon atherosclerotic calcification but no stenosis greater than 30% suspected. The anterior and middle cerebral vessels are patent without large or medium vessel occlusion, proximal stenosis, aneurysm or vascular malformation. Posterior circulation: Both vertebral arteries are patent to the basilar. No basilar stenosis. Posterior circulation branch vessels are patent. Venous sinuses: Patent and normal. Anatomic variants: None significant. Review of the MIP images confirms the above findings IMPRESSION: No large or medium vessel occlusion. Aortic atherosclerosis. Atherosclerotic change at the right carotid bifurcation region. Maximal stenosis in the ICA bulb is only 25%. No left carotid bifurcation stenosis. Mild atherosclerotic change at the upper cervical ICA with calcified plaque and mild wall irregularity but no stenosis. No posterior circulation pathology. Electronically Signed   By: Nelson Chimes M.D.   On: 04/13/2019 19:31   CT ANGIO NECK W OR WO CONTRAST  Result Date: 04/13/2019 CLINICAL DATA:  Right facial droop and aphasia. No acute finding by MRI. EXAM: CT ANGIOGRAPHY HEAD AND NECK TECHNIQUE: Multidetector CT imaging of the head and neck was performed using the standard protocol during bolus administration of intravenous contrast. Multiplanar CT image reconstructions and MIPs were obtained to evaluate the vascular anatomy. Carotid stenosis measurements (when applicable) are obtained utilizing NASCET criteria, using the distal internal carotid diameter as the denominator. CONTRAST:  156mL OMNIPAQUE IOHEXOL 350 MG/ML SOLN COMPARISON:  CT and MRI same day FINDINGS: CTA NECK FINDINGS Aortic arch: Aortic atherosclerosis. No aneurysm or dissection. Branching pattern is normal without origin stenosis. Right carotid system: Common carotid artery widely patent to the bifurcation region. Calcified plaque at the  carotid bifurcation and ICA bulb. Minimal ICA diameter 3 mm. Compared to a more distal cervical ICA diameter of 4 mm, this indicates a 25% stenosis. No abnormality seen in the more distal cervical ICA. Left carotid system: Common carotid artery widely patent to the bifurcation. No soft or calcified plaque at the carotid bifurcation or ICA bulb. No stenosis. Cervical ICA shows some calcified plaque and mild wall irregularity at the skull base level. No stenosis however. Vertebral arteries: Both vertebral artery origins are widely patent. Both vertebral arteries appear normal through the cervical region to the foramen magnum. Skeleton: Ordinary cervical spondylosis. Other neck: No mass or lymphadenopathy. Upper chest: Normal Review of the MIP images confirms the above findings CTA HEAD FINDINGS Anterior circulation: Both internal carotid arteries are patent through the skull base and siphon regions. There is ordinary siphon atherosclerotic calcification but no stenosis greater than 30% suspected. The anterior and middle cerebral vessels are patent without large or medium vessel occlusion, proximal stenosis, aneurysm or vascular malformation. Posterior circulation: Both vertebral arteries are patent to the  basilar. No basilar stenosis. Posterior circulation branch vessels are patent. Venous sinuses: Patent and normal. Anatomic variants: None significant. Review of the MIP images confirms the above findings IMPRESSION: No large or medium vessel occlusion. Aortic atherosclerosis. Atherosclerotic change at the right carotid bifurcation region. Maximal stenosis in the ICA bulb is only 25%. No left carotid bifurcation stenosis. Mild atherosclerotic change at the upper cervical ICA with calcified plaque and mild wall irregularity but no stenosis. No posterior circulation pathology. Electronically Signed   By: Nelson Chimes M.D.   On: 04/13/2019 19:31   MR ANGIO NECK W WO CONTRAST  Result Date: 04/13/2019 CLINICAL DATA:   TIA.  Right facial droop and aphasia. EXAM: MRA NECK WITHOUT AND WITH CONTRAST TECHNIQUE: Multiplanar and multiecho pulse sequences of the neck were obtained without and with intravenous contrast. Angiographic images of the neck were obtained using MRA technique without and with intravenous contrast. CONTRAST:  9.42mL GADAVIST GADOBUTROL 1 MMOL/ML IV SOLN COMPARISON:  None. FINDINGS: No sign of stenosis or occlusion. Both carotid arteries appear normal. Carotid bifurcations are normal. Both vertebral arteries appear normal at their origins and through the cervical region to the basilar. IMPRESSION: Normal MR angiography of the neck vessels. Electronically Signed   By: Nelson Chimes M.D.   On: 04/13/2019 18:18   MR BRAIN WO CONTRAST  Result Date: 04/13/2019 CLINICAL DATA:  Follow-up stroke.  Right facial droop.  Aphasia. EXAM: MRI HEAD WITHOUT CONTRAST TECHNIQUE: Multiplanar, multiecho pulse sequences of the brain and surrounding structures were obtained without intravenous contrast. COMPARISON:  02/20/2017 FINDINGS: Brain: Diffusion imaging does not show any acute or subacute infarction. Brainstem does not show focal lesion. There is cerebellar atrophy. Cerebral hemispheres show generalized atrophy with moderate chronic small-vessel ischemic changes of the deep and subcortical white matter. No mass, hemorrhage, hydrocephalus or extra-axial collection. Vascular: Major vessels at the base of the brain show flow. Skull and upper cervical spine: Negative Sinuses/Orbits: Clear/normal Other: None IMPRESSION: No acute infarction. Generalized brain atrophy. Chronic small-vessel ischemic changes of the cerebral hemispheric white matter. Electronically Signed   By: Nelson Chimes M.D.   On: 04/13/2019 18:33   CT HEAD CODE STROKE WO CONTRAST  Result Date: 04/13/2019 CLINICAL DATA:  Code stroke. 86 year old male with right facial droop and aphasia. EXAM: CT HEAD WITHOUT CONTRAST TECHNIQUE: Contiguous axial images were  obtained from the base of the skull through the vertex without intravenous contrast. COMPARISON:  None available. FINDINGS: Brain: Cerebral volume loss which appears fairly generalized and is felt to explain ventricular prominence on an ex vacuo basis. No midline shift, mass effect, or evidence of intracranial mass lesion. No acute intracranial hemorrhage identified. Patchy bilateral white matter hypodensity with some involvement of the deep white matter capsules. Deep gray nuclei are relatively spared. No superimposed acute cortically based infarct identified. Vascular: Extensive Calcified atherosclerosis at the skull base. No suspicious intracranial vascular hyperdensity. Skull: No acute osseous abnormality identified. Sinuses/Orbits: Visualized paranasal sinuses and mastoids are clear. Other: No acute orbit or scalp soft tissue finding. ASPECTS [facility] Stroke Program Early CT Score) Total score (0-10 with 10 being normal): 10 IMPRESSION: 1. No acute cortically based infarct or acute intracranial hemorrhage identified. ASPECTS 10. 2. Generalized cerebral volume loss, and white matter changes most commonly due to chronic small vessel disease. 3. These results were communicated to Dr. Lorraine Lax at 12:47 pm on 04/13/2019 by text page via the [facility] messaging system. Electronically Signed   By: Genevie Ann M.D.   On: 04/13/2019 12:47  PHYSICAL EXAM Frail elderly Caucasian male not in distress.  Slightly hard of hearing. . Afebrile. Head is nontraumatic. Neck is supple without bruit.    Cardiac exam no murmur or gallop. Lungs are clear to auscultation. Distal pulses are well felt. Neurological Exam ;  Awake  Alert oriented x 3. Normal speech and language.eye movements full without nystagmus.fundi were not visualized. Vision acuity and fields appear normal. Hearing is normal. Palatal movements are normal. Face symmetric. Tongue midline. Normal strength, tone, reflexes and coordination. Normal sensation. Gait  deferred.  ASSESSMENT/PLAN Mr. DEMIR TITSWORTH is a 86 y.o. male with history of HTN, HLD presenting with dysarthria, mild aphasia and facial droop.   L brain TIA  Code Stroke CT head No acute abnormality.  Small vessel disease. Atrophy. ASPECTS 10.     MRA neck  Unremarkable   MRI  No acute infarct. Small vessel disease. Atrophy.   CTA head & neck no LVO. Aortic atherosclerosis. ICA atherosclerosis w/ 25% stenosis in R bulb.   2D Echo normal ejection fraction of 65%.  No definite cardiac source of embolism.  LDL 74  HgbA1c 5.4  SCDs for VTE prophylaxis  No antithrombotic prior to admission, now enrolled in [facility] axiomatic trial - d/c pt home w/ study drugs from pharmacy. Order written. Do not give additional antithrombotics.  Therapy recommendations:  No therapy needs  Disposition:  Return home  Hypertension  Stable . BP goal normotensive  Hyperlipidemia  Home meds:  lipitor 20  Now on lipitor 40  LDL 74, goal < 70  Continue statin at discharge  Other Stroke Risk Factors  Advanced age  ETOH use, advised to drink no more than 2 drink(s) a day  Overweight Body mass index is 28.3 kg/m., recommend weight loss, diet and exercise as appropriate   Hospital day # 0 Patient can be discharged home on BMS stroke trial medication and follow-up in the stroke research clinic as scheduled.  Continue aggressive risk factor modification.  Discussed with patient and Dr. Oretha Milch .greater than 50% time during this 25-minute visit was spent on counseling and coordination of care and answering questions Antony Contras, MD To contact Stroke Continuity provider, please refer to [email or web address] After hours, contact General Neurology

## 2019-04-17 ENCOUNTER — Telehealth: Payer: Self-pay

## 2019-04-17 NOTE — Telephone Encounter (Signed)
I returned the patient's wife who had a question concerning her husband's research medication. States she has now spoken with Theodis Aguas in research and he is getting clarification for her.

## 2019-04-17 NOTE — Telephone Encounter (Signed)
Patient wife called to get clarification on the patients medication dosage. Patient wife would also like to check on how the patient will be able to get refills for his lipitor.  Patient wife states he was seen by Dr.Sethi at the ED. Please follow up.

## 2019-05-22 ENCOUNTER — Other Ambulatory Visit (HOSPITAL_COMMUNITY): Payer: Self-pay | Admitting: Neurology

## 2019-05-29 ENCOUNTER — Other Ambulatory Visit (HOSPITAL_COMMUNITY): Payer: Self-pay | Admitting: Neurology

## 2019-05-29 DIAGNOSIS — I6340 Cerebral infarction due to embolism of unspecified cerebral artery: Secondary | ICD-10-CM

## 2019-07-13 ENCOUNTER — Other Ambulatory Visit: Payer: Self-pay

## 2019-07-13 ENCOUNTER — Ambulatory Visit (HOSPITAL_COMMUNITY)
Admission: RE | Admit: 2019-07-13 | Discharge: 2019-07-13 | Disposition: A | Discharge status: Home / Self Care | Payer: Medicare Other | Source: Ambulatory Visit | Attending: Neurology | Admitting: Neurology

## 2019-07-13 DIAGNOSIS — I6340 Cerebral infarction due to embolism of unspecified cerebral artery: Secondary | ICD-10-CM | POA: Insufficient documentation

## 2019-07-13 IMAGING — MR MR HEAD W/O CM
6 series · 48 of 48 positions shown · non-contrast
Comparison: MRI head [DATE]

CLINICAL DATA: Follow-up stroke.

EXAM:
MRI HEAD WITHOUT CONTRAST
TECHNIQUE: Multiplanar, multiecho pulse sequences of the brain and surrounding
structures were obtained without intravenous contrast.

[Series 2: DWI · axial · 5.0mm · 0.94mm/px · z∈[-91,+69]mm · 13 of 66 slices shown]
[im 1/66]
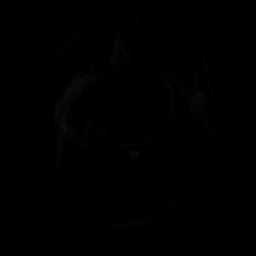
[im 6/66]
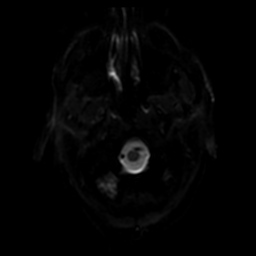
[im 11/66]
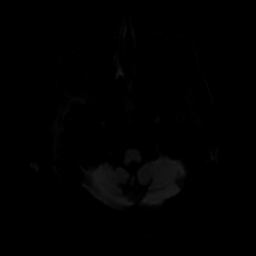
[im 17/66]
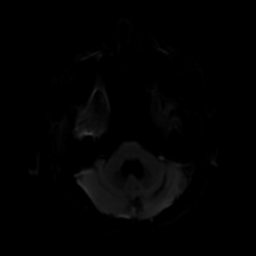
[im 22/66]
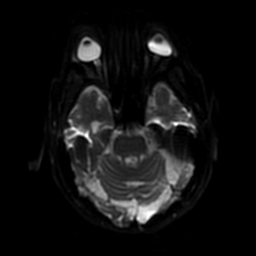
[im 28/66]
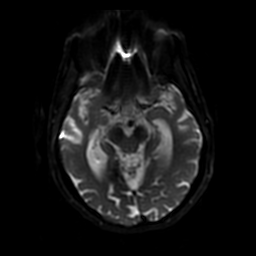
[im 33/66]
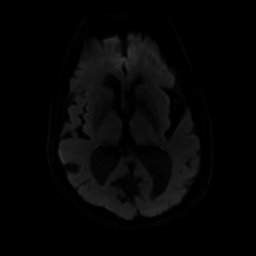
[im 38/66]
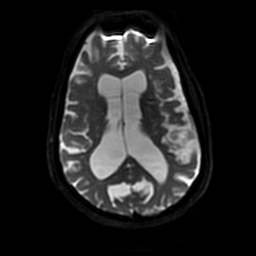
[im 44/66]
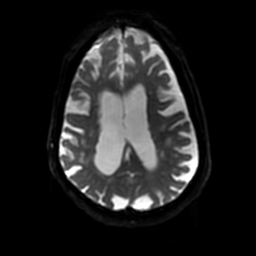
[im 49/66]
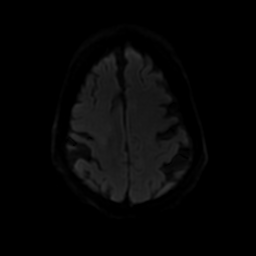
[im 55/66]
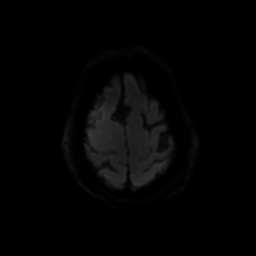
[im 60/66]
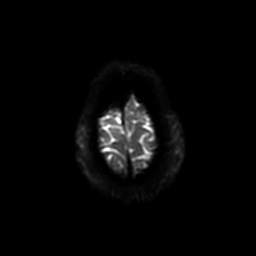
[im 66/66]
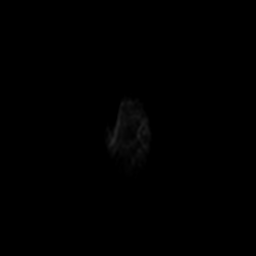

[Series 3: FLAIR · axial · 5.0mm · 0.94mm/px · z∈[-88,+62]mm · 7 of 31 slices shown]
[im 1/31]
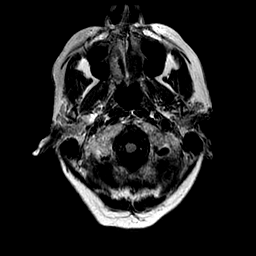
[im 6/31]
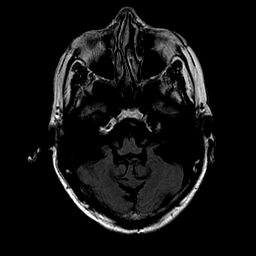
[im 11/31]
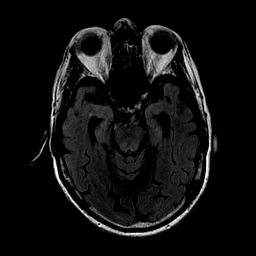
[im 16/31]
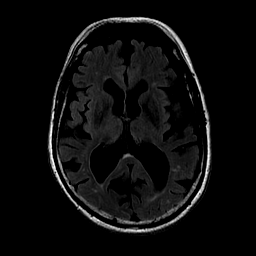
[im 21/31]
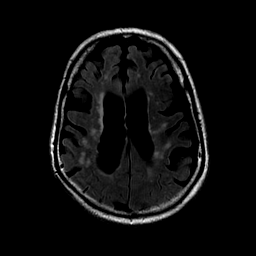
[im 26/31]
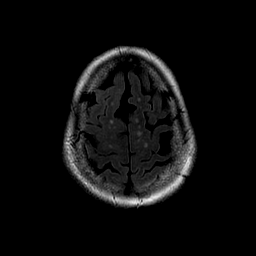
[im 31/31]
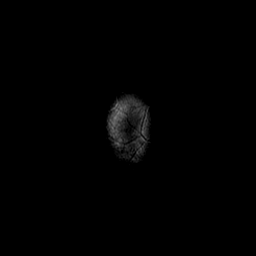

[Series 4: T2-star · axial · 5.0mm · 0.94mm/px · z∈[-88,+62]mm · 7 of 31 slices shown]
[im 1/31]
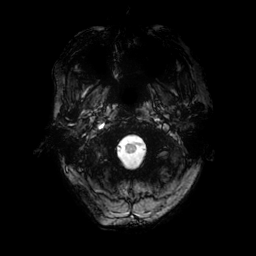
[im 6/31]
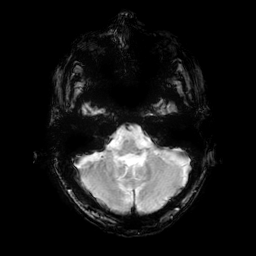
[im 11/31]
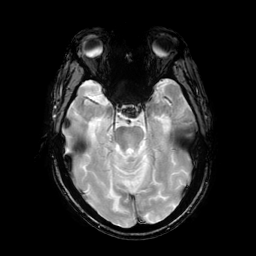
[im 16/31]
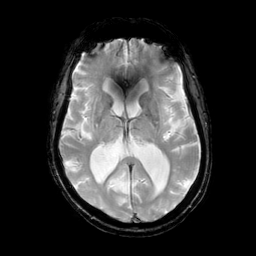
[im 21/31]
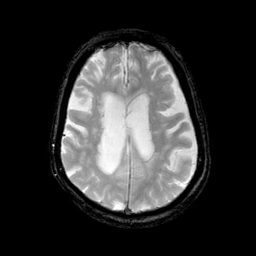
[im 26/31]
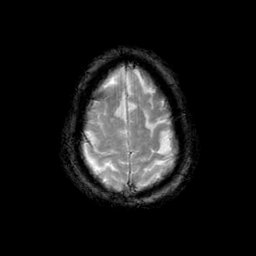
[im 31/31]
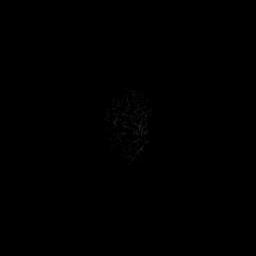

[Series 5: T1 · axial · 5.0mm · 0.94mm/px · z∈[-88,+62]mm · 7 of 31 slices shown]
[im 1/31]
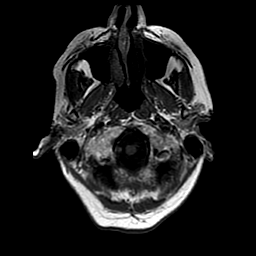
[im 6/31]
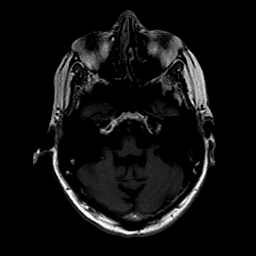
[im 11/31]
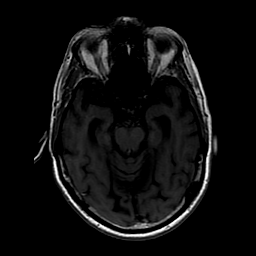
[im 16/31]
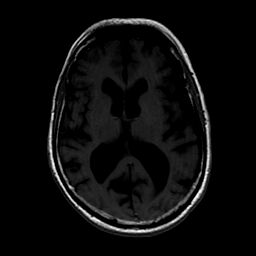
[im 21/31]
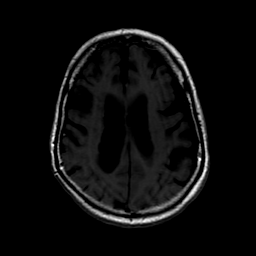
[im 26/31]
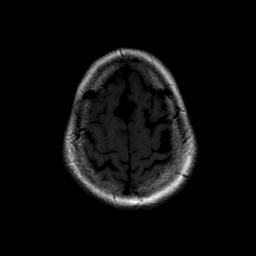
[im 31/31]
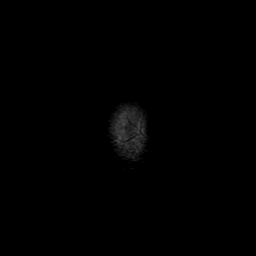

[Series 250: ADC · axial · 5.0mm · 0.94mm/px · z∈[-91,+69]mm · 7 of 33 slices shown]
[im 1/33]
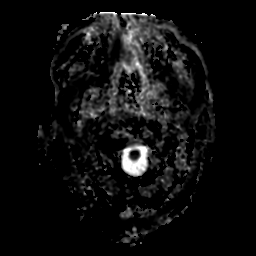
[im 6/33]
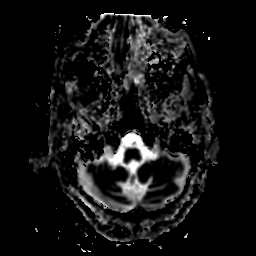
[im 11/33]
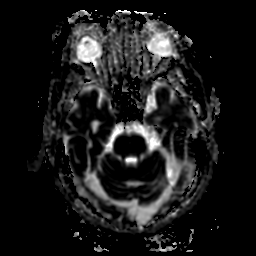
[im 17/33]
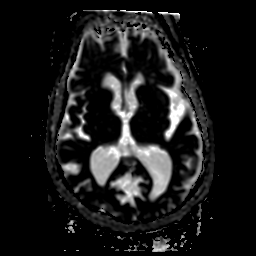
[im 22/33]
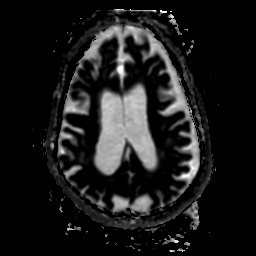
[im 27/33]
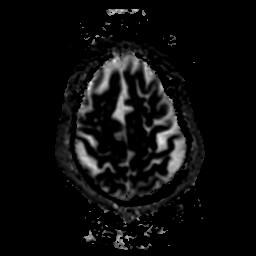
[im 33/33]
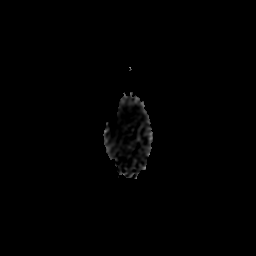

[Series 251: eadc(no-q) · axial · 5.0mm · 0.94mm/px · z∈[-91,+69]mm · 7 of 33 slices shown]
[im 1/33]
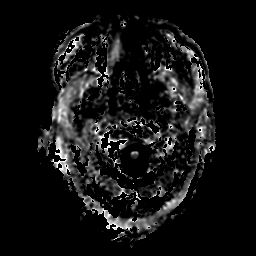
[im 6/33]
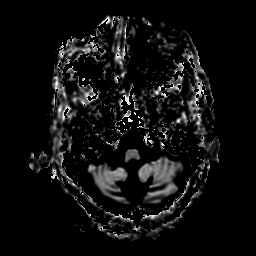
[im 11/33]
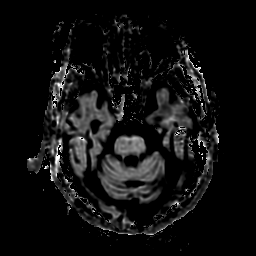
[im 17/33]
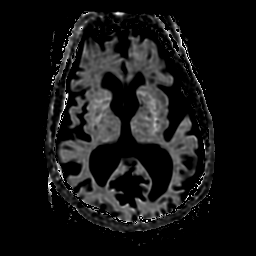
[im 22/33]
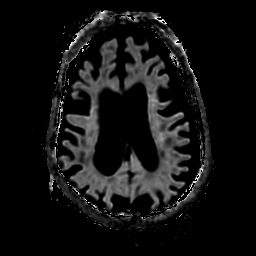
[im 27/33]
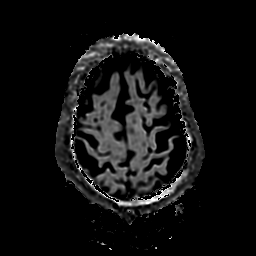
[im 33/33]
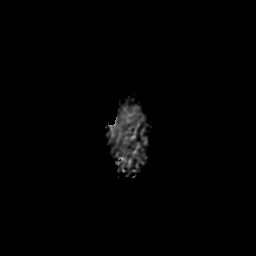

[48 of 48 positions shown; findings below may reference images not displayed]

FINDINGS: BMS [AGE] MRI protocol.  Limited sequences.

Advanced atrophy. Negative for acute infarct. Moderate chronic
microvascular ischemic change in the cerebral white matter
bilaterally. Negative for hemorrhage or mass.
IMPRESSION: Negative for acute infarct

No change from the prior study.

## 2019-07-13 NOTE — Progress Notes (Signed)
Kindly inform the patient that MRI scan of the brain shows no new or unexpected findings.  Nothing to worry about

## 2019-07-17 ENCOUNTER — Telehealth: Payer: Self-pay | Admitting: Neurology

## 2019-07-17 NOTE — Telephone Encounter (Signed)
75 mg once daily

## 2019-07-17 NOTE — Telephone Encounter (Signed)
Bevely Palmer from [facility] called stating that Dr. Leonie Man called in Plavix 75mg  for the pt and they are needing to speak to the provider or RN about the dosage. Please advise.

## 2019-07-17 NOTE — Telephone Encounter (Signed)
I called Gabriel [name] about plaviix dosage. She stated he wrote it as brand name which is several hundred dollars. I stated all of our pts take generic plavix 75mg . She verbalized understanding.

## 2019-08-24 ENCOUNTER — Other Ambulatory Visit: Payer: Self-pay

## 2019-08-24 MED ORDER — CLOPIDOGREL BISULFATE 75 MG PO TABS: 75.0000 mg | ORAL_TABLET | Freq: Every day | ORAL | 0 refills | Status: DC

## 2020-01-22 ENCOUNTER — Ambulatory Visit: Payer: Medicare Other | Admitting: Neurology

## 2020-01-23 ENCOUNTER — Other Ambulatory Visit: Payer: Self-pay

## 2020-01-23 ENCOUNTER — Ambulatory Visit: Payer: Medicare Other | Admitting: Neurology

## 2020-01-23 ENCOUNTER — Encounter: Payer: Self-pay | Admitting: Neurology

## 2020-01-23 VITALS — BP 108/64 | HR 65 | Ht 70.0 in | Wt 203.0 lb

## 2020-01-23 DIAGNOSIS — G459 Transient cerebral ischemic attack, unspecified: Secondary | ICD-10-CM | POA: Diagnosis not present

## 2020-01-23 NOTE — Progress Notes (Signed)
[facility] [address] [place]. [place] [address] [phone]       OFFICE FOLLOW-UP NOTE  Mr. Gabriel [name] Date of Birth:  12/26/1930 Medical Record Number:  [id number]   HPI: [name] is a pleasant 86 year old Caucasian male seen today for initial office follow-up visit following hospital consultation for TIA in January 2021.  He is accompanied by his wife.  History is obtained from them and review of electronic medical records and I personally reviewed available pertinent imaging films in PACS. He has past medical history of hyperlipidemia, hypertension and gait difficulties who presented to [facility] on 04/17/2019 with sudden onset of slurred speech, facial droop and gait difficulty.  Symptoms resolved by the time he was seen.  CT scan of the head showed no acute findings and MRI scan was negative for acute infarct.  CT angiogram showed only mild extra intracranial atherosclerotic changes without large vessel stenosis.  LDL cholesterol 74 mg percent hemoglobin A1c was 5.4.  2D echo showed normal ejection fraction.  Patient participated in the [employer]xi[employer] stroke prevention trial.  He finished participation in a plane and had follow-up study specific MRI which showed no acute abnormality and stable changes.  Patient states is done well since discharge.  He has not had any recurrent stroke or TIA symptoms.  He is on Plavix which is tolerating well without significant bruising or bleeding.  His blood pressures well controlled and today it is 108/64.  She is tolerating Lipitor well without muscle aches and pains.  He had annual physical exam with his primary care physician in August and apparently all lab work was okay.  He has no complaints today.   ROS:   14 system review of systems is positive for no complaints today and all systems negative PMH:  Past Medical History:  Diagnosis Date  . Gait disorder 02/04/2017  . High cholesterol   . Hypertension      Social History:  Social History   Socioeconomic History  . Marital status: Married    Spouse name: [name]  . Number of children: 2  . Years of education: 69  . Highest education level: Not on file  Occupational History  . Occupation: part time  Tobacco Use  . Smoking status: Never Smoker  . Smokeless tobacco: Never Used  Vaping Use  . Vaping Use: Never used  Substance and Sexual Activity  . Alcohol use: Yes    Comment: 1 beer per week  . Drug use: No  . Sexual activity: Not on file  Other Topics Concern  . Not on file  Social History Narrative   Lives with wife   Caffeine use: rare   Right handed    Social Determinants of Health   Financial Resource Strain:   . Difficulty of Paying Living Expenses: Not on file  Food Insecurity:   . Worried About Charity fundraiser in the Last Year: Not on file  . Ran Out of Food in the Last Year: Not on file  Transportation Needs:   . Lack of Transportation (Medical): Not on file  . Lack of Transportation (Non-Medical): Not on file  Physical Activity:   . Days of Exercise per Week: Not on file  . Minutes of Exercise per Session: Not on file  Stress:   . Feeling of Stress : Not on file  Social Connections:   . Frequency of Communication with Friends and Family: Not on file  . Frequency of Social Gatherings with  Friends and Family: Not on file  . Attends Religious Services: Not on file  . Active Member of Clubs or Organizations: Not on file  . Attends Archivist Meetings: Not on file  . Marital Status: Not on file  Intimate Partner Violence:   . Fear of Current or Ex-Partner: Not on file  . Emotionally Abused: Not on file  . Physically Abused: Not on file  . Sexually Abused: Not on file    Medications:   Current Outpatient Medications on File Prior to Visit  Medication Sig Dispense Refill  . atorvastatin (LIPITOR) 40 MG tablet Take 1 tablet (40 mg total) by mouth daily. 45 tablet 1  . clopidogrel (PLAVIX)  75 MG tablet Take 1 tablet (75 mg total) by mouth daily. 90 tablet 0  . hydrochlorothiazide (MICROZIDE) 12.5 MG capsule Take 12.5 mg by mouth daily.    Marland Kitchen lisinopril (PRINIVIL,ZESTRIL) 10 MG tablet Take 10 mg by mouth daily.  4   No current facility-administered medications on file prior to visit.    Allergies:  No Known Allergies  Physical Exam General: well developed, well nourished elderly Caucasian male, seated, in no evident distress Head: head normocephalic and atraumatic.  Neck: supple with no carotid or supraclavicular bruits Cardiovascular: regular rate and rhythm, no murmurs Musculoskeletal: no deformity Skin:  no rash/petichiae Vascular:  Normal pulses all extremities Vitals:   01/23/20 1445  BP: 108/64  Pulse: 65   Neurologic Exam Mental Status: Awake and fully alert. Oriented to place and time. Recent and remote memory intact. Attention span, concentration and fund of knowledge appropriate. Mood and affect appropriate.  Cranial Nerves: Fundoscopic exam reveals sharp disc margins. Pupils equal, briskly reactive to light. Extraocular movements full without nystagmus. Visual fields full to confrontation. Hearing slightly diminished bilaterally.. Facial sensation intact. Face, tongue, palate moves normally and symmetrically.  Motor: Normal bulk and tone. Normal strength in all tested extremity muscles. Sensory.: intact to touch ,pinprick .position and vibratory sensation.  Coordination: Rapid alternating movements normal in all extremities. Finger-to-nose and heel-to-shin performed accurately bilaterally. Gait and Station: Arises from chair without difficulty. Stance is normal. Gait slightly broad-based but normal stride length and balance . Able to heel, toe and tandem walk with moderate difficulty.  Reflexes: 1+ and symmetric. Toes downgoing.   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  Vascular risk factors of hypertension, hyperlipidemia and age.  Patient completed participation in the [employer]MS Axiomaticstroke prevention trial     PLAN: I had a long d/w patient about his recent TIA risk for recurrent stroke/TIAs, personally independently reviewed imaging studies and stroke evaluation results and answered questions.Continue Plavix 75 mg daily for secondary stroke prevention and maintain strict control of hypertension with blood pressure goal below 130/90, diabetes with hemoglobin A1c goal below 6.5% and lipids with LDL cholesterol goal below 70 mg/dL. I also advised the patient to eat a healthy diet with plenty of whole grains, cereals, fruits and vegetables, exercise regularly and maintain ideal body weight Followup in the future with me in 1 year or call earlier if necessary. Greater than 50% of time during this 25 minute visit was spent on counseling,explanation of diagnosis of TIA, planning of further management, discussion with patient and family and coordination of care Antony Contras, MD  [facility] [address] [place], [place] [address]  Phone [phone] Fax [phone] Note: This document was prepared  with digital dictation and possible smart phrase technology. Any transcriptional errors that result from this process are unintentional

## 2020-01-23 NOTE — Patient Instructions (Signed)
I had a long d/w patient about his recent TIA risk for recurrent stroke/TIAs, personally independently reviewed imaging studies and stroke evaluation results and answered questions.Continue Plavix 75 mg daily for secondary stroke prevention and maintain strict control of hypertension with blood pressure goal below 130/90, diabetes with hemoglobin A1c goal below 6.5% and lipids with LDL cholesterol goal below 70 mg/dL. I also advised the patient to eat a healthy diet with plenty of whole grains, cereals, fruits and vegetables, exercise regularly and maintain ideal body weight Followup in the future with me in 1 year or call earlier if necessary.  Stroke Prevention Some medical conditions and behaviors are associated with a higher chance of having a stroke. You can help prevent a stroke by making nutrition, lifestyle, and other changes, including managing any medical conditions you may have. What nutrition changes can be made?   Eat healthy foods. You can do this by: ? Choosing foods high in fiber, such as fresh fruits and vegetables and whole grains. ? Eating at least 5 or more servings of fruits and vegetables a day. Try to fill half of your plate at each meal with fruits and vegetables. ? Choosing lean protein foods, such as lean cuts of meat, poultry without skin, fish, tofu, beans, and nuts. ? Eating low-fat dairy products. ? Avoiding foods that are high in salt (sodium). This can help lower blood pressure. ? Avoiding foods that have saturated fat, trans fat, and cholesterol. This can help prevent high cholesterol. ? Avoiding processed and premade foods.  Follow your health care provider's specific guidelines for losing weight, controlling high blood pressure (hypertension), lowering high cholesterol, and managing diabetes. These may include: ? Reducing your daily calorie intake. ? Limiting your daily sodium intake to 1,500 milligrams (mg). ? Using only healthy fats for cooking, such as olive  oil, canola oil, or sunflower oil. ? Counting your daily carbohydrate intake. What lifestyle changes can be made?  Maintain a healthy weight. Talk to your health care provider about your ideal weight.  Get at least 30 minutes of moderate physical activity at least 5 days a week. Moderate activity includes brisk walking, biking, and swimming.  Do not use any products that contain nicotine or tobacco, such as cigarettes and e-cigarettes. If you need help quitting, ask your health care provider. It may also be helpful to avoid exposure to secondhand smoke.  Limit alcohol intake to no more than 1 drink a day for nonpregnant women and 2 drinks a day for men. One drink equals 12 oz of beer, 5 oz of wine, or 1 oz of hard liquor.  Stop any illegal drug use.  Avoid taking birth control pills. Talk to your health care provider about the risks of taking birth control pills if: ? You are over 16 years old. ? You smoke. ? You get migraines. ? You have ever had a blood clot. What other changes can be made?  Manage your cholesterol levels. ? Eating a healthy diet is important for preventing high cholesterol. If cholesterol cannot be managed through diet alone, you may also need to take medicines. ? Take any prescribed medicines to control your cholesterol as told by your health care provider.  Manage your diabetes. ? Eating a healthy diet and exercising regularly are important parts of managing your blood sugar. If your blood sugar cannot be managed through diet and exercise, you may need to take medicines. ? Take any prescribed medicines to control your diabetes as told by your health  care provider.  Control your hypertension. ? To reduce your risk of stroke, try to keep your blood pressure below 130/80. ? Eating a healthy diet and exercising regularly are an important part of controlling your blood pressure. If your blood pressure cannot be managed through diet and exercise, you may need to take  medicines. ? Take any prescribed medicines to control hypertension as told by your health care provider. ? Ask your health care provider if you should monitor your blood pressure at home. ? Have your blood pressure checked every year, even if your blood pressure is normal. Blood pressure increases with age and some medical conditions.  Get evaluated for sleep disorders (sleep apnea). Talk to your health care provider about getting a sleep evaluation if you snore a lot or have excessive sleepiness.  Take over-the-counter and prescription medicines only as told by your health care provider. Aspirin or blood thinners (antiplatelets or anticoagulants) may be recommended to reduce your risk of forming blood clots that can lead to stroke.  Make sure that any other medical conditions you have, such as atrial fibrillation or atherosclerosis, are managed. What are the warning signs of a stroke? The warning signs of a stroke can be easily remembered as BEFAST.  B is for balance. Signs include: ? Dizziness. ? Loss of balance or coordination. ? Sudden trouble walking.  E is for eyes. Signs include: ? A sudden change in vision. ? Trouble seeing.  F is for face. Signs include: ? Sudden weakness or numbness of the face. ? The face or eyelid drooping to one side.  A is for arms. Signs include: ? Sudden weakness or numbness of the arm, usually on one side of the body.  S is for speech. Signs include: ? Trouble speaking (aphasia). ? Trouble understanding.  T is for time. ? These symptoms may represent a serious problem that is an emergency. Do not wait to see if the symptoms will go away. Get medical help right away. Call your local emergency services (911 in the [place] Do not drive yourself to the hospital.  Other signs of stroke may include: ? A sudden, severe headache with no known cause. ? Nausea or vomiting. ? Seizure. Where to find more information For more information,  visit:  American Stroke Association: www.strokeassociation.org  National Stroke Association: www.stroke.org Summary  You can prevent a stroke by eating healthy, exercising, not smoking, limiting alcohol intake, and managing any medical conditions you may have.  Do not use any products that contain nicotine or tobacco, such as cigarettes and e-cigarettes. If you need help quitting, ask your health care provider. It may also be helpful to avoid exposure to secondhand smoke.  Remember BEFAST for warning signs of stroke. Get help right away if you or a loved one has any of these signs. This information is not intended to replace advice given to you by your health care provider. Make sure you discuss any questions you have with your health care provider. Document Revised: 03/05/2017 Document Reviewed: 04/28/2016 Elsevier Patient Education  2020 [employer].

## 2020-06-07 ENCOUNTER — Ambulatory Visit (INDEPENDENT_AMBULATORY_CARE_PROVIDER_SITE_OTHER): Payer: Medicare Other

## 2020-06-07 ENCOUNTER — Other Ambulatory Visit: Payer: Self-pay

## 2020-06-07 ENCOUNTER — Encounter (HOSPITAL_COMMUNITY): Payer: Self-pay

## 2020-06-07 ENCOUNTER — Ambulatory Visit (HOSPITAL_COMMUNITY)
Admission: EM | Admit: 2020-06-07 | Discharge: 2020-06-07 | Disposition: A | Discharge status: Home / Self Care | Payer: Medicare Other | Attending: Medical Oncology | Admitting: Medical Oncology

## 2020-06-07 DIAGNOSIS — R059 Cough, unspecified: Secondary | ICD-10-CM

## 2020-06-07 DIAGNOSIS — R9389 Abnormal findings on diagnostic imaging of other specified body structures: Secondary | ICD-10-CM | POA: Diagnosis not present

## 2020-06-07 DIAGNOSIS — Z8701 Personal history of pneumonia (recurrent): Secondary | ICD-10-CM

## 2020-06-07 IMAGING — DX DG CHEST 2V
2 series · 2 of 2 positions shown · non-contrast
Comparison: [DATE].  CT [DATE].

CLINICAL DATA: Cough.  History of pneumonia

EXAM:
CHEST - 2 VIEW

[chest pa]
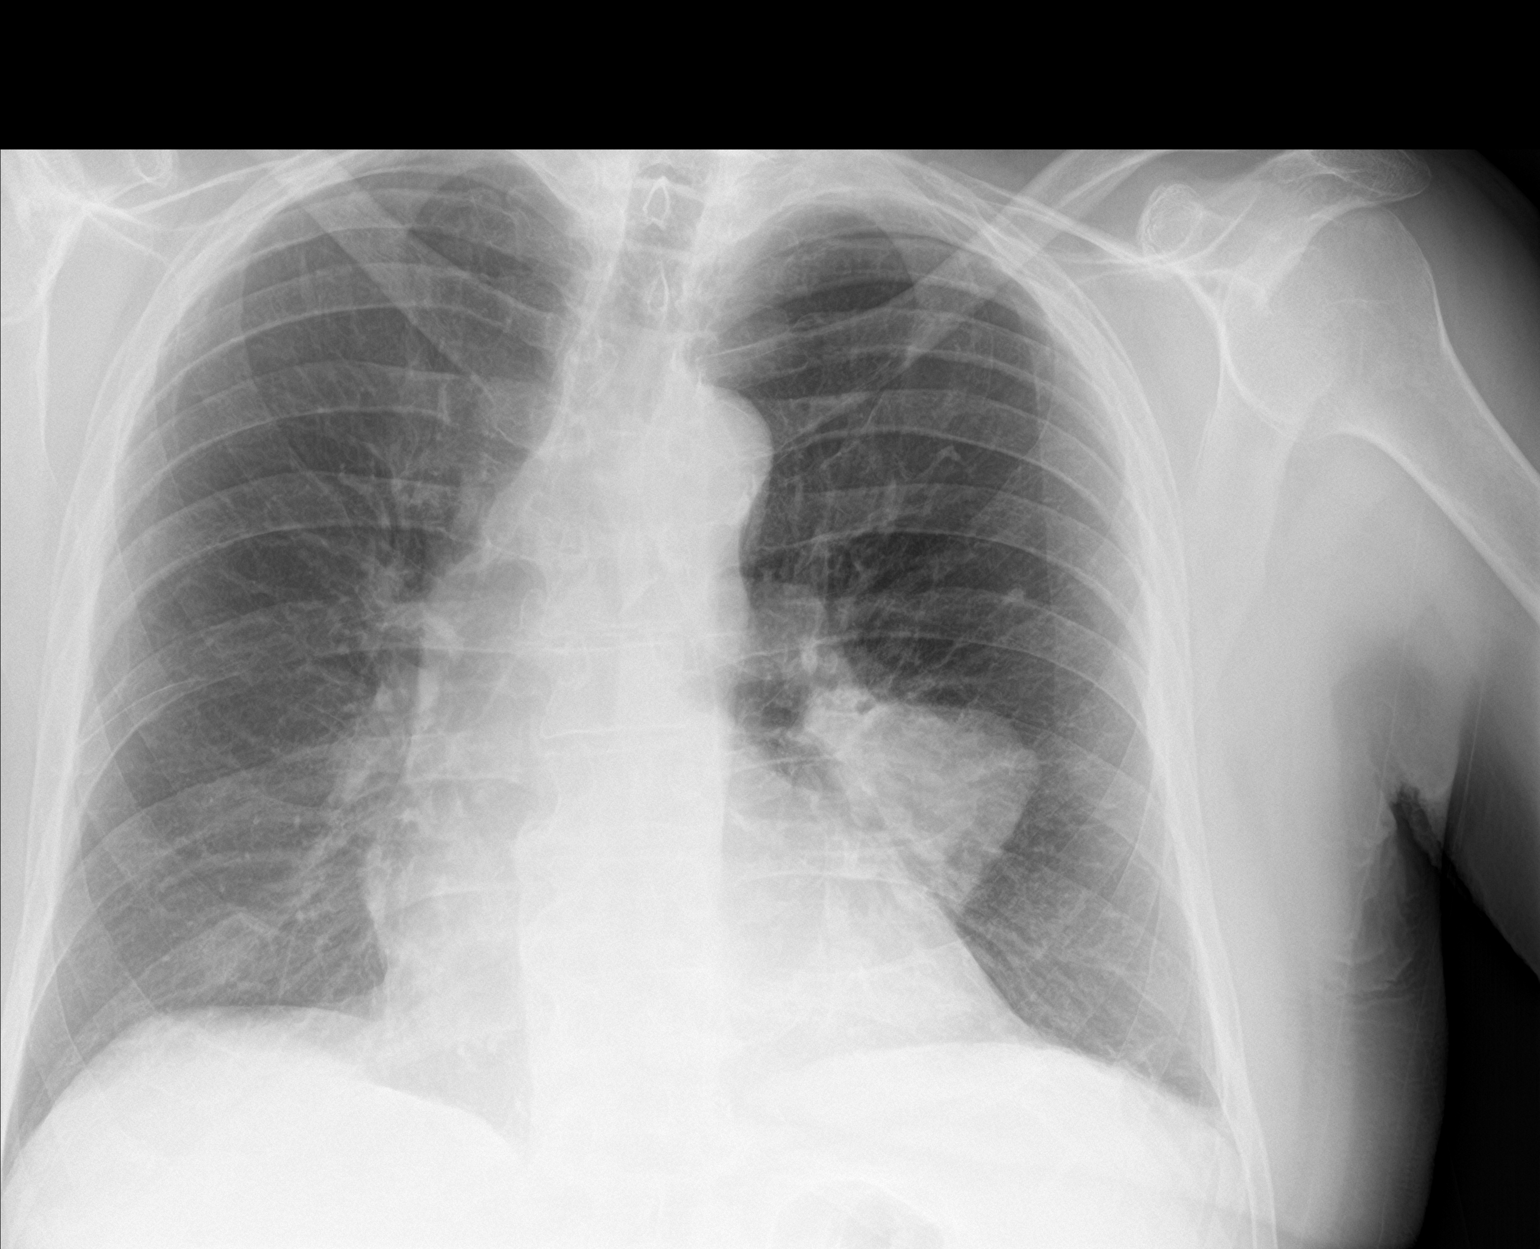

[chest lat]
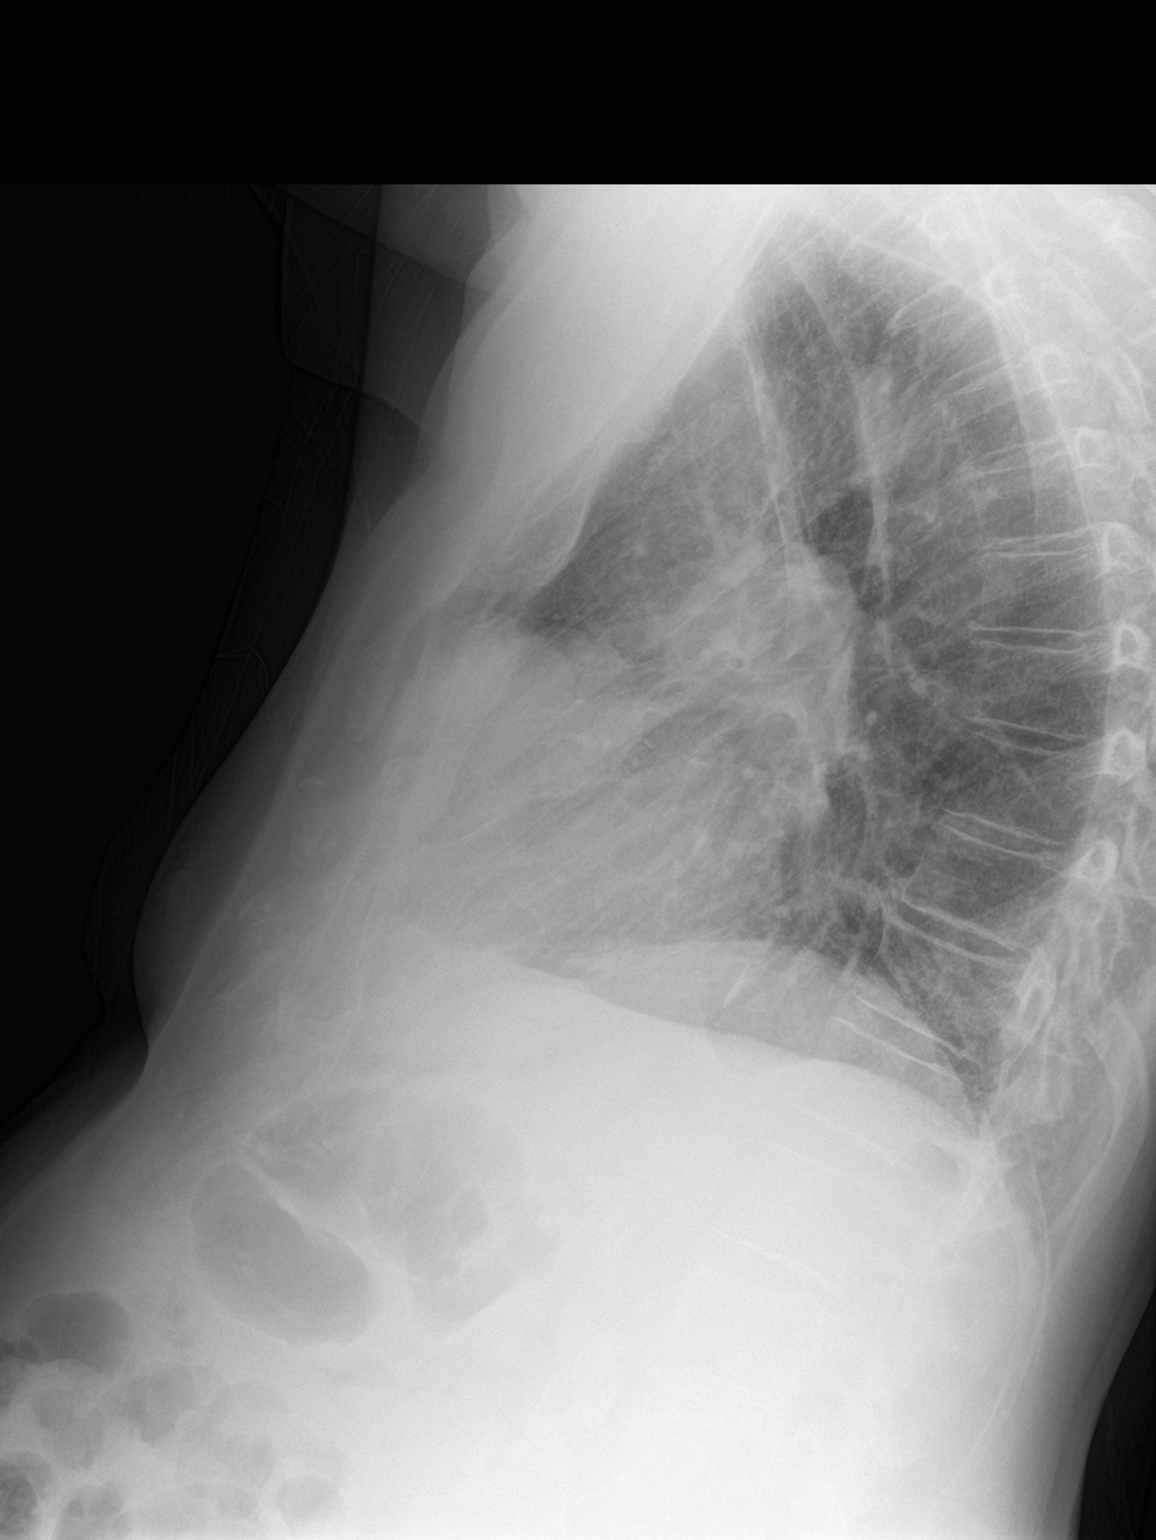

[2 of 2 positions shown; findings below may reference images not displayed]

FINDINGS: Ovoid airspace opacity seen within the lingula. This could reflect
rounded pneumonia. Heart is normal size. Right lung clear. No
effusions or acute bony abnormality.
IMPRESSION: Rounded/ovoid opacity in the lingula could reflect rounded
pneumonia. Consider further evaluation with chest CT with IV
contrast to exclude mass lesion.

## 2020-06-07 MED ORDER — AMOXICILLIN-POT CLAVULANATE 875-125 MG PO TABS: 1.0000 | ORAL_TABLET | Freq: Two times a day (BID) | ORAL | 0 refills | Status: DC

## 2020-06-07 NOTE — ED Triage Notes (Signed)
Pt presents with cough x 1 week. Hinds worse by the ned of the day.Denies fever, nasal congestion, SOB, chest pain. OTC cough drops gives relief.

## 2020-06-07 NOTE — ED Provider Notes (Signed)
[facility]    CSN: [id number] Arrival date & time: 06/07/20  0940      History   Chief Complaint Chief Complaint  Patient presents with  . Cough    HPI Gabriel [name] is a 87 y.o. male.   HPI   Cough: Patient presents with his wife who helps assist with history mildly. Patient states that for the past week he has had a cough. Cough has worsened over the past few days. Occasional productive sputum but he is not able to tell me the color but he denies any hemoptysis. He denies any chest pain or shortness of breath. They are concerned as he has had pneumonia a few times in the past and want to catch any potential pneumonia early. He has not had any fevers, excessive fatigue, weakness, memory trouble. He has used cough drops and Mucinex with some relief.  Past Medical History:  Diagnosis Date  . Gait disorder 02/04/2017  . High cholesterol   . Hypertension     Patient Active Problem List   Diagnosis Date Noted  . TIA (transient ischemic attack) 04/13/2019  . Essential hypertension 04/13/2019  . Dyslipidemia 04/13/2019  . Gait disorder 02/04/2017    History reviewed. No pertinent surgical history.   Home Medications    Prior to Admission medications   Medication Sig Start Date End Date Taking? Authorizing Provider  atorvastatin (LIPITOR) 40 MG tablet Take 1 tablet (40 mg total) by mouth daily. 04/15/19   Oretha Milch D, MD  clopidogrel (PLAVIX) 75 MG tablet Take 1 tablet (75 mg total) by mouth daily. 08/24/19   Garvin Fila, MD  hydrochlorothiazide (MICROZIDE) 12.5 MG capsule Take 12.5 mg by mouth daily. 04/04/19   [provider]  lisinopril (PRINIVIL,ZESTRIL) 10 MG tablet Take 10 mg by mouth daily. 11/20/16   [provider]    Family History Family History  Problem Relation Age of Onset  . Heart attack Mother   . Heart disease Father   . Cancer - Prostate Father   . Cancer Brother        renal    Social History Social History    Tobacco Use  . Smoking status: Never Smoker  . Smokeless tobacco: Never Used  Vaping Use  . Vaping Use: Never used  Substance Use Topics  . Alcohol use: Yes    Comment: 1 beer per week  . Drug use: No     Allergies   Patient has no known allergies.   Review of Systems Review of Systems  As stated above in HPI Physical Exam Triage Vital Signs ED Triage Vitals  Enc Vitals Group     BP 06/07/20 0959 118/66     Pulse Rate 06/07/20 0959 68     Resp 06/07/20 0959 16     Temp 06/07/20 0959 97.6 F (36.4 C)     Temp Source 06/07/20 0959 Oral     SpO2 06/07/20 0959 98 %     Weight --      Height --      Head Circumference --      Peak Flow --      Pain Score 06/07/20 0957 0     Pain Loc --      Pain Edu? --      Excl. in [place]? --    No data found.  Updated Vital Signs BP 118/66 (BP Location: Left Arm)   Pulse 68   Temp 97.6 F (36.4 C) (Oral)  Resp 16   SpO2 98%   Physical Exam Vitals and nursing note reviewed.  Constitutional:      General: He is not in acute distress.    Appearance: Normal appearance. He is not ill-appearing, toxic-appearing or diaphoretic.  HENT:     Head: Normocephalic and atraumatic.     Nose: Rhinorrhea present. No congestion.     Mouth/Throat:     Mouth: Mucous membranes are moist.     Pharynx: No posterior oropharyngeal erythema.  Eyes:     Extraocular Movements: Extraocular movements intact.     Pupils: Pupils are equal, round, and reactive to light.  Cardiovascular:     Rate and Rhythm: Normal rate and regular rhythm.     Heart sounds: Normal heart sounds.  Pulmonary:     Effort: Pulmonary effort is normal. No respiratory distress.     Breath sounds: No stridor. Rhonchi (Mild Right lower lung field) present. No wheezing or rales.  Abdominal:     Palpations: Abdomen is soft.  Musculoskeletal:     Cervical back: Normal range of motion and neck supple.  Lymphadenopathy:     Cervical: No cervical adenopathy.  Neurological:      Mental Status: He is alert and oriented to person, place, and time.     Motor: No weakness.  Psychiatric:        Mood and Affect: Mood normal.        Behavior: Behavior normal.        Thought Content: Thought content normal.      UC Treatments / Results  Labs (all labs ordered are listed, but only abnormal results are displayed) Labs Reviewed - No data to display  EKG   Radiology No results found.  Procedures Procedures (including critical care time)  Medications Ordered in UC Medications - No data to display  Initial Impression / Assessment and Plan / UC Course  I have reviewed the triage vital signs and the nursing notes.  Pertinent labs & imaging results that were available during my care of the patient were reviewed by me and considered in my medical decision making (see chart for details).     New. Chest x ray pending.   Update: I discussed the x-ray findings with patient and his wife.  We are going to treat for pneumonia in the meantime and he will need to follow-up with his primary care provider next week.  Discussed red flag signs and symptoms along with how to take the medication along with common potential side effects and precautions.  We discussed the importance of having repeat imaging in 30 days to ensure that the area has resolved.  If not he will need a chest CT scan to evaluate for a potential mass.  We discussed that a CT scan would not be ideal within the next few weeks as if this is pneumonia he may not have the best read on the CT scan.  We discussed deep breathing exercises.  Final Clinical Impressions(s) / UC Diagnoses   Final diagnoses:  None   Discharge Instructions   None    ED Prescriptions    None     PDMP not reviewed this encounter.   Hughie Closs, [place] 06/07/20 1120

## 2020-06-26 ENCOUNTER — Ambulatory Visit
Admission: RE | Admit: 2020-06-26 | Discharge: 2020-06-26 | Disposition: A | Discharge status: Home / Self Care | Payer: Medicare Other | Source: Ambulatory Visit | Attending: Internal Medicine | Admitting: Internal Medicine

## 2020-06-26 ENCOUNTER — Other Ambulatory Visit: Payer: Self-pay | Admitting: Internal Medicine

## 2020-06-26 DIAGNOSIS — R059 Cough, unspecified: Secondary | ICD-10-CM

## 2020-06-26 IMAGING — DX DG CHEST 2V
2 series · 2 of 2 positions shown · non-contrast
Comparison: Chest x-ray [DATE].

CLINICAL DATA: 89-year-old male with history of cough and
pneumonia.

EXAM:
CHEST - 2 VIEW

[dg chest 2 view (1 of 2)]
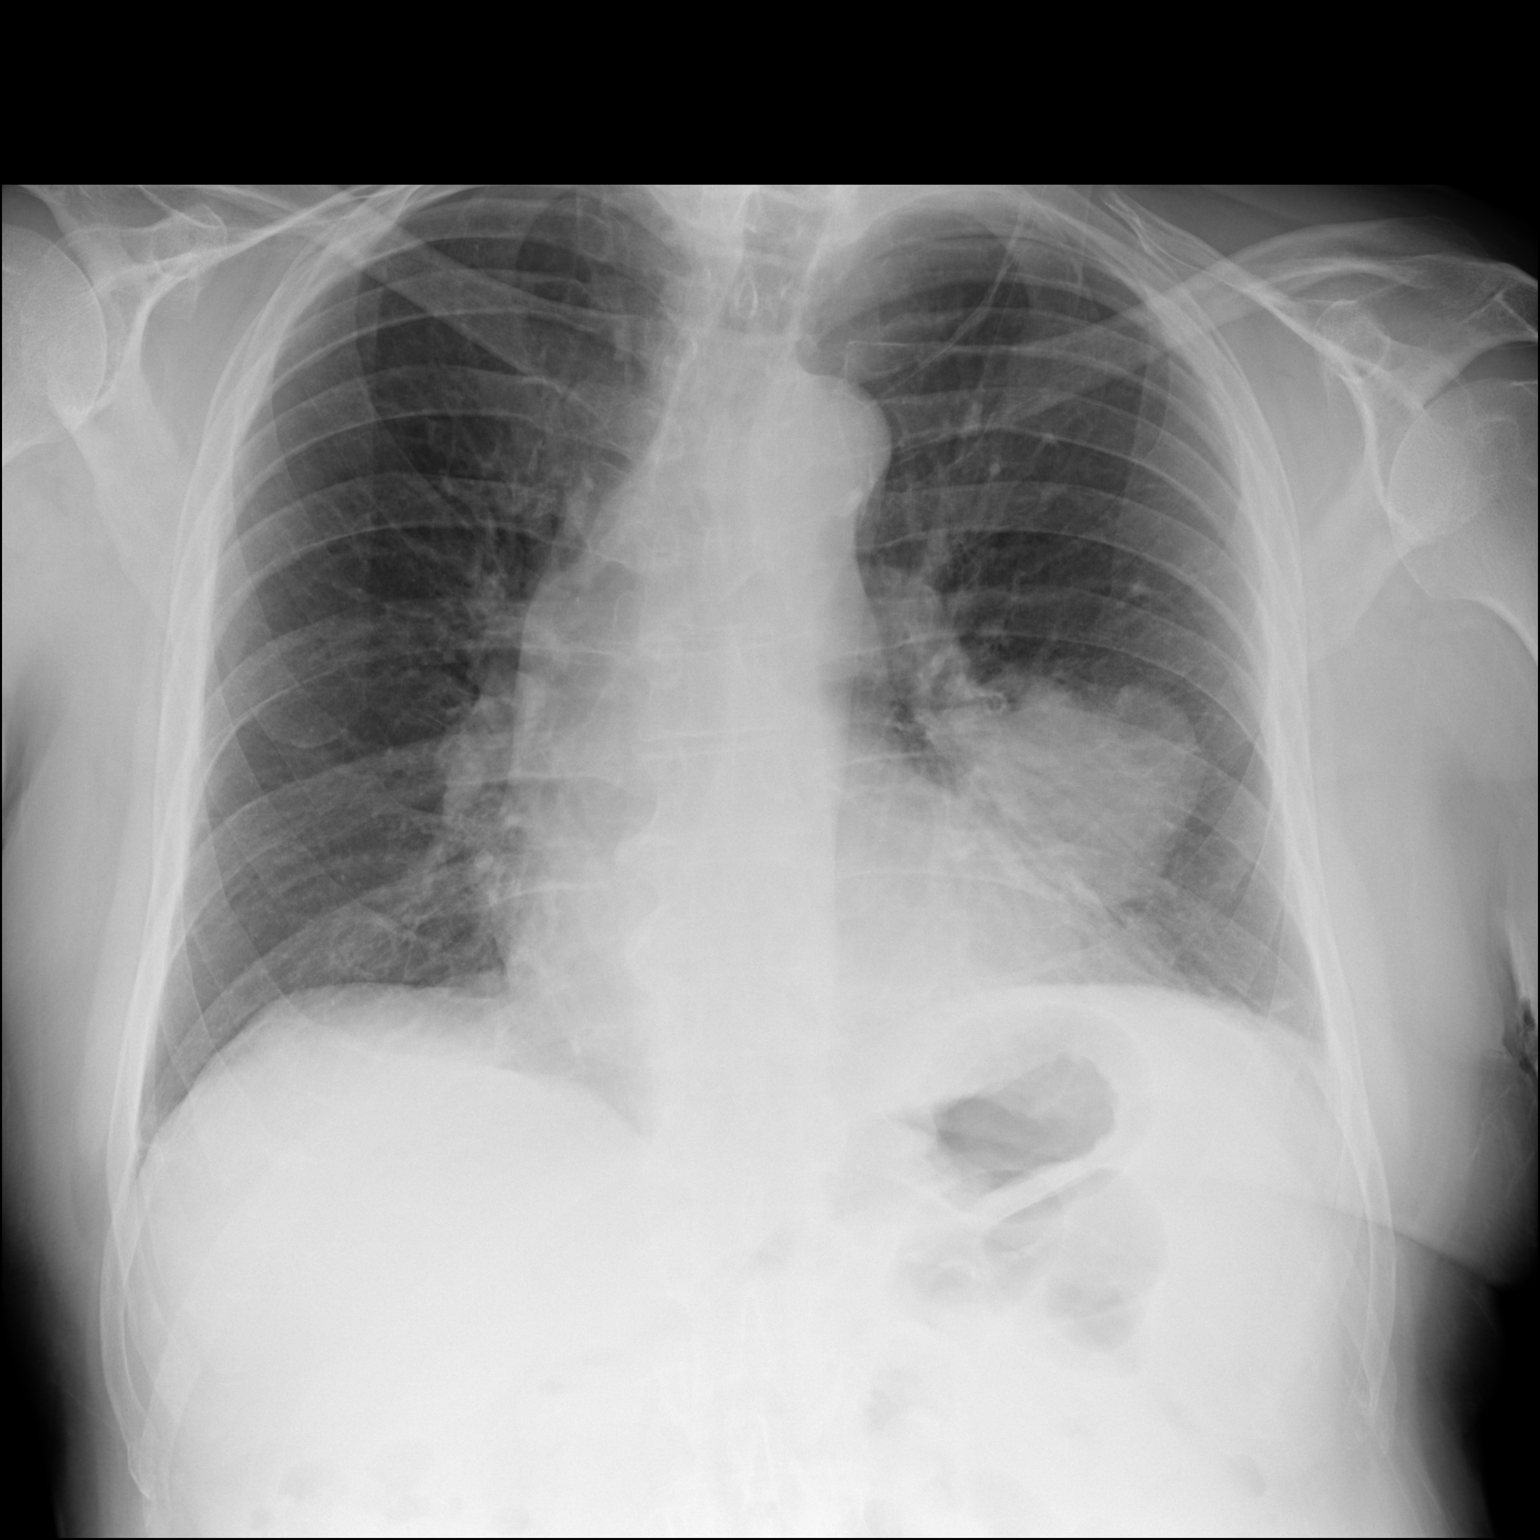

[dg chest 2 view (2 of 2)]
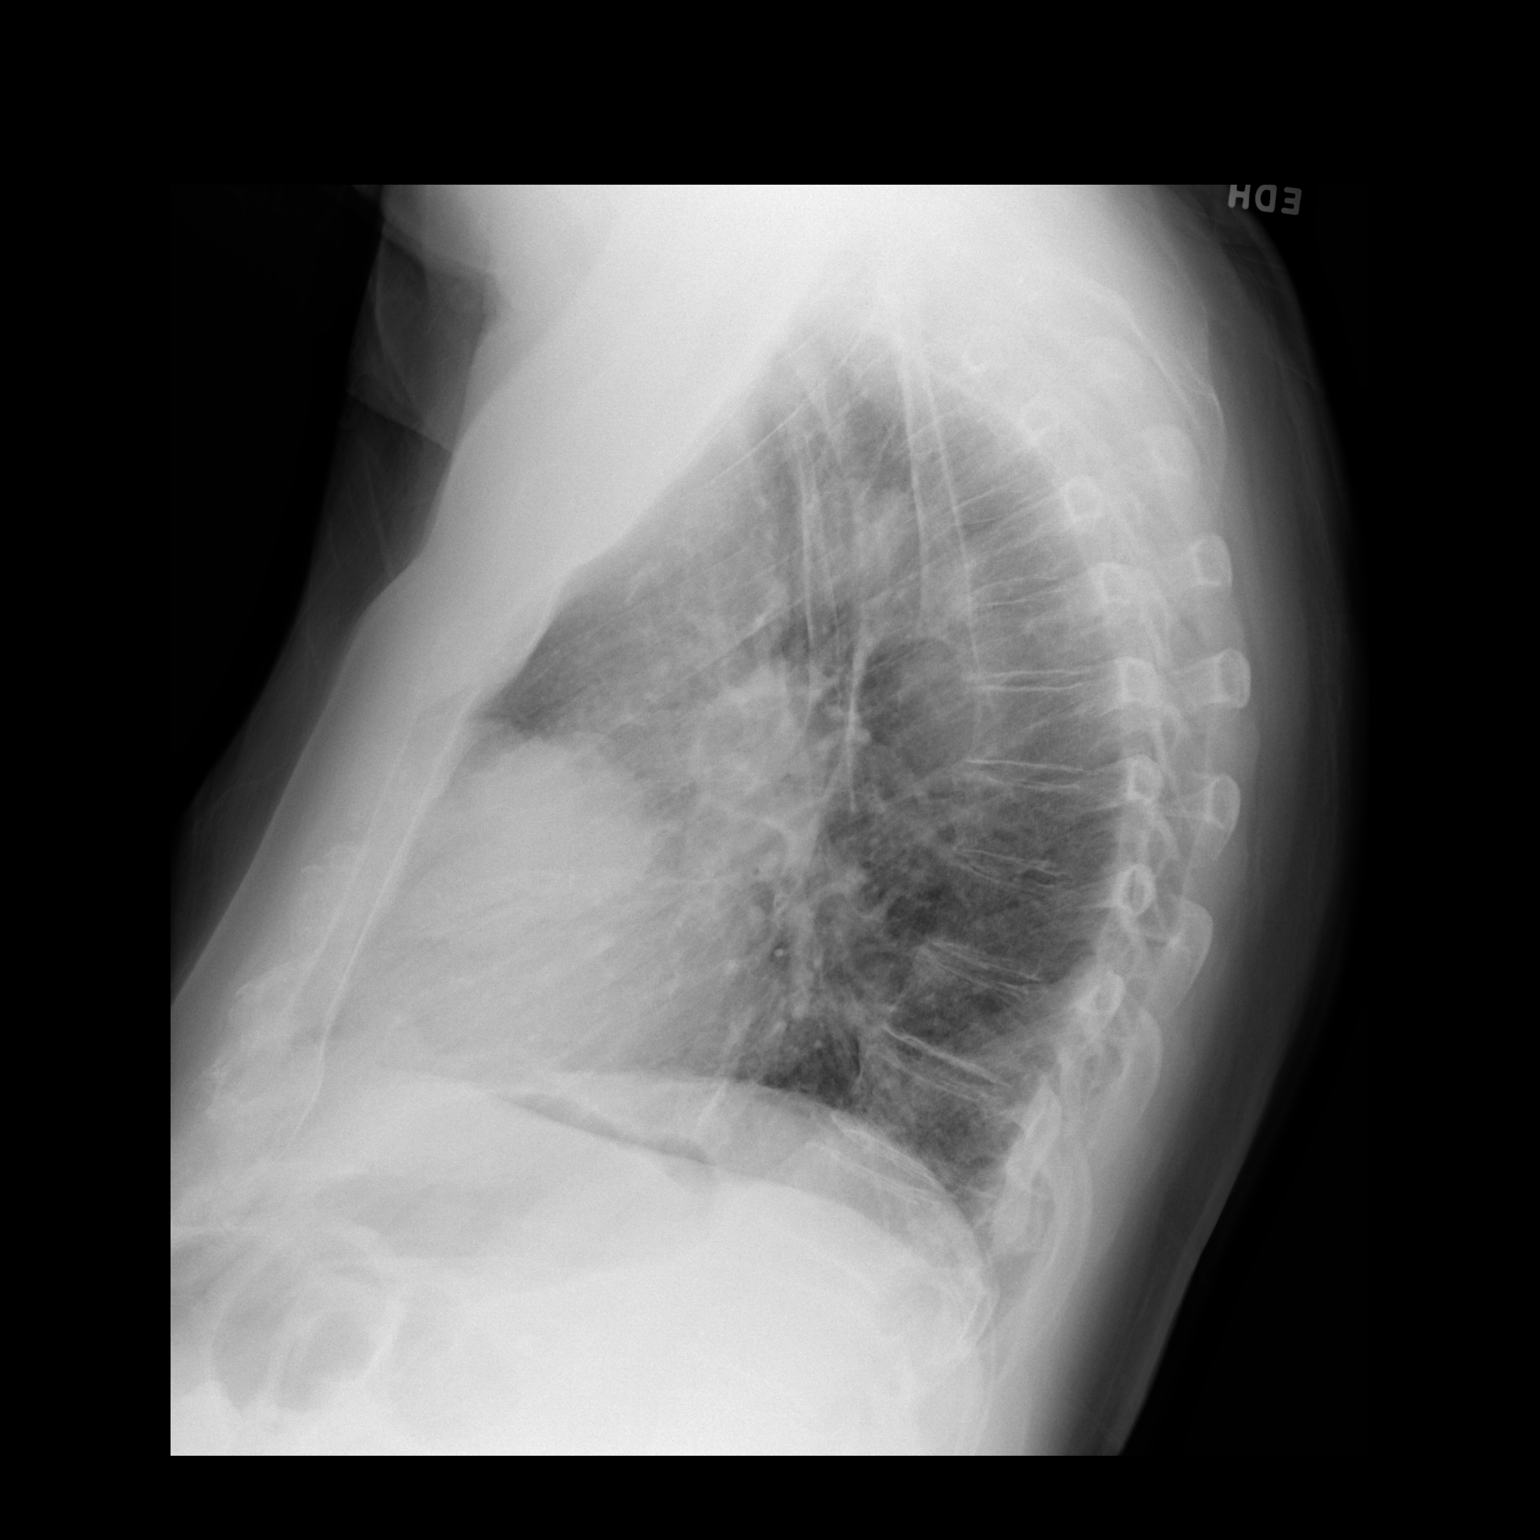

[2 of 2 positions shown; findings below may reference images not displayed]

FINDINGS: Persistent and enlarging mass in the lingula estimated to measure
approximately 7.7 x 6.3 x 5.6 cm on today's examination, highly
concerning for primary bronchogenic neoplasm. Right lung is clear.
No definite consolidative airspace disease. No pleural effusions. No
pneumothorax. No evidence of pulmonary edema. Heart size is normal.
Fullness in the left hilar region concerning for potential
lymphadenopathy.
IMPRESSION: 1. Findings are highly concerning for primary bronchogenic neoplasm
in the left upper lobe, which appears progressive compared to the
recent prior study. Further evaluation with contrast enhanced chest
CT is once again strongly recommended at this time.

These results will be called to the ordering clinician or
representative by the Radiologist Assistant, and communication
documented in the PACS or [REDACTED].

## 2020-07-02 ENCOUNTER — Other Ambulatory Visit: Payer: Self-pay | Admitting: Internal Medicine

## 2020-07-02 DIAGNOSIS — R9389 Abnormal findings on diagnostic imaging of other specified body structures: Secondary | ICD-10-CM

## 2020-07-23 ENCOUNTER — Ambulatory Visit
Admission: RE | Admit: 2020-07-23 | Discharge: 2020-07-23 | Disposition: A | Discharge status: Home / Self Care | Payer: Medicare Other | Source: Ambulatory Visit | Attending: Internal Medicine | Admitting: Internal Medicine

## 2020-07-23 ENCOUNTER — Other Ambulatory Visit: Payer: Self-pay

## 2020-07-23 DIAGNOSIS — R9389 Abnormal findings on diagnostic imaging of other specified body structures: Secondary | ICD-10-CM

## 2020-07-23 IMAGING — CT CT CHEST W/ CM
1 series · 14 of 34 positions shown, 18 images · IV contrast (APPLIED)
Comparison: Chest radiograph dated [DATE].

CLINICAL DATA: 89-year-old male with left upper lobe lung cancer.

EXAM:
CT CHEST WITH CONTRAST
TECHNIQUE: Multidetector CT imaging of the chest was performed during
intravenous contrast administration.
CONTRAST:  75mL [XF] IOPAMIDOL ([XF]) INJECTION 61%

[Series 2: chest w/cm · axial · 0.73mm/px · z∈[-282,-20]mm · 14 of 155 slices shown, 18 images]
[im 12/155  mediastinal]
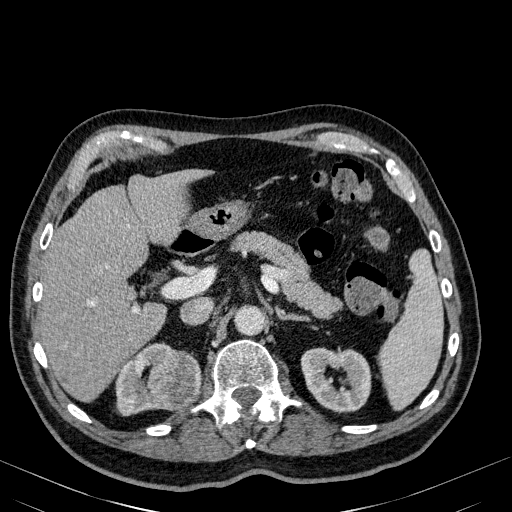
[im 12/155  lung]
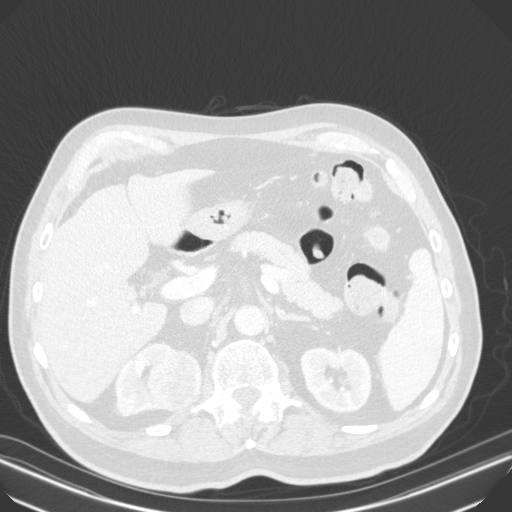
[im 23/155  lung]
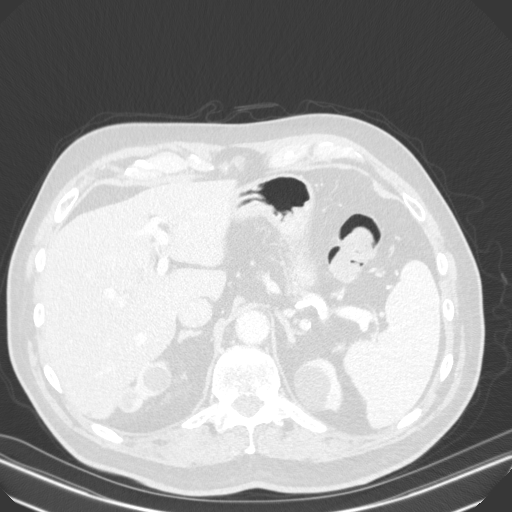
[im 31/155  lung]
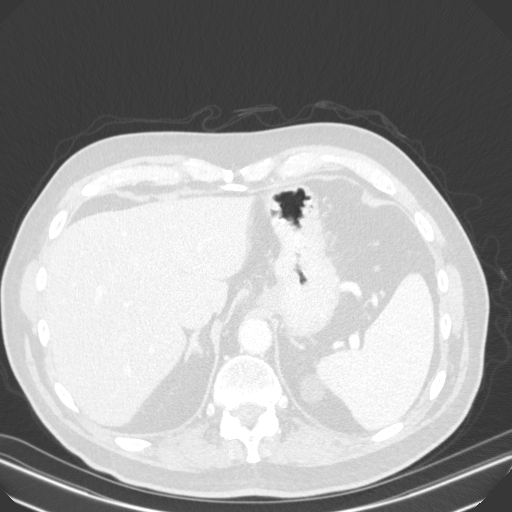
[im 46/155  lung]
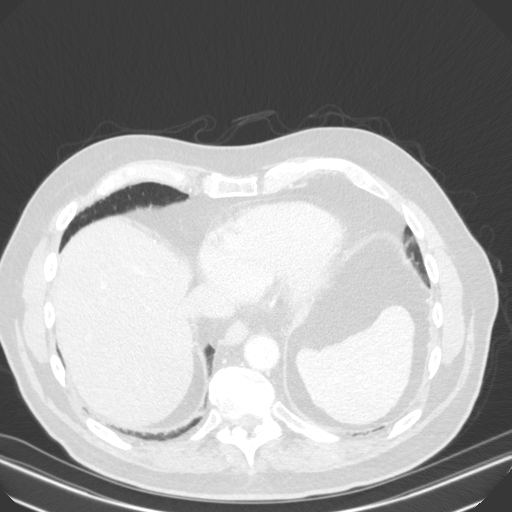
[im 58/155  mediastinal]
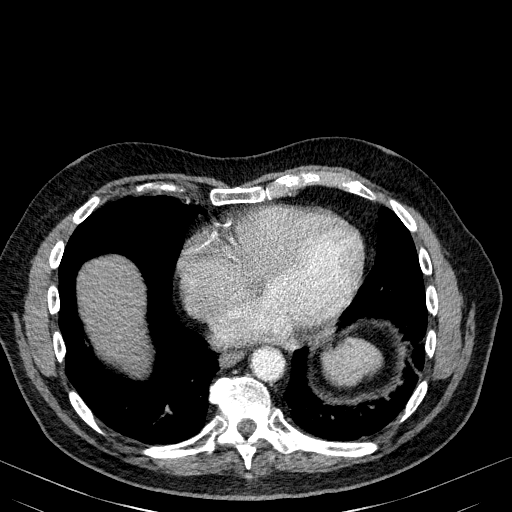
[im 58/155  lung]
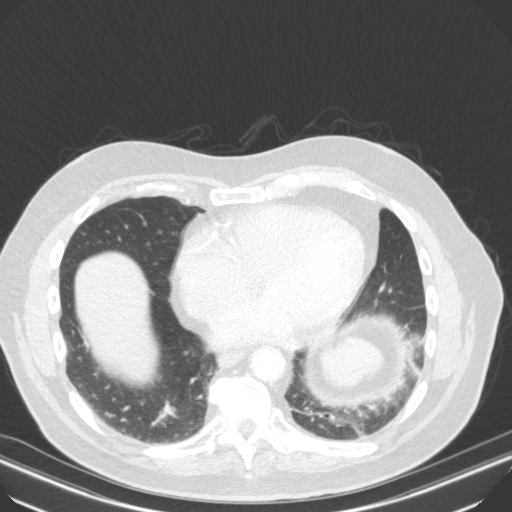
[im 63/155  lung]
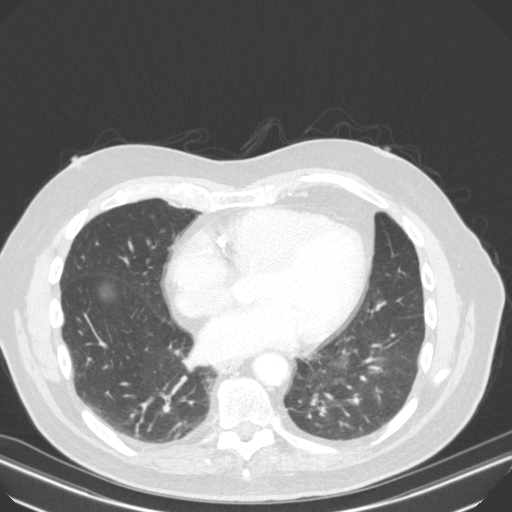
[im 73/155  lung]
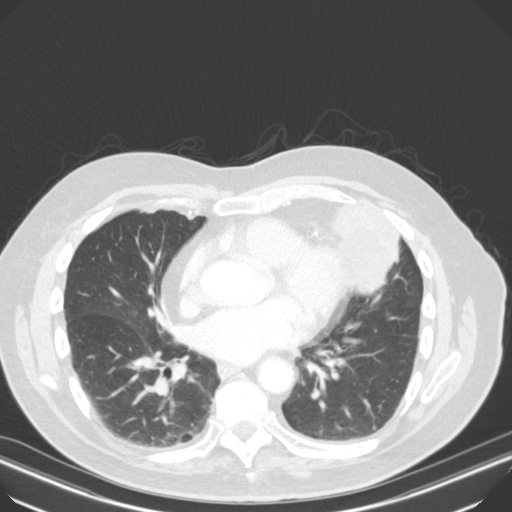
[im 83/155  lung]
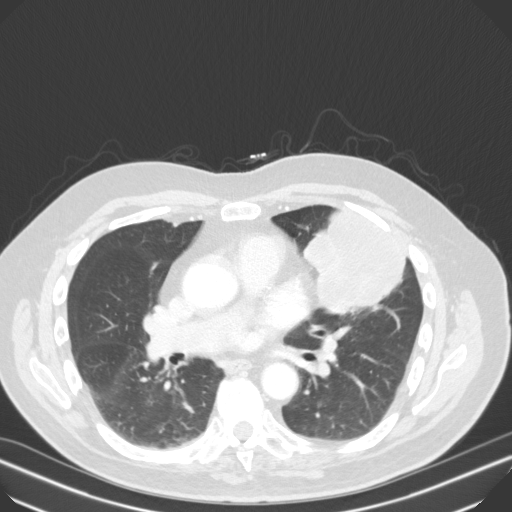
[im 92/155  mediastinal]
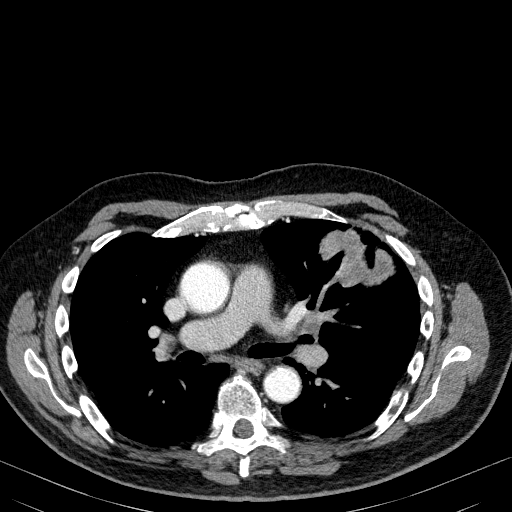
[im 92/155  lung]
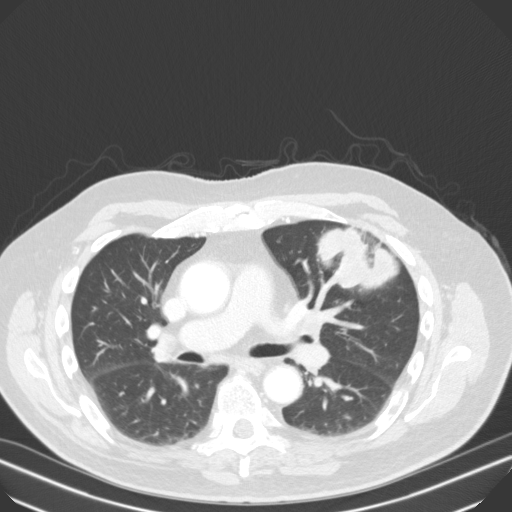
[im 97/155  lung]
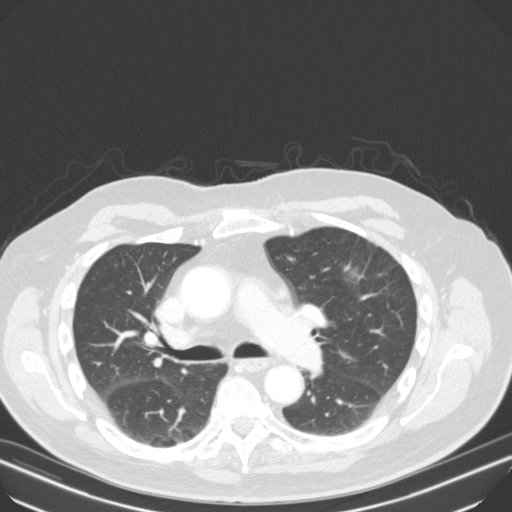
[im 115/155  lung]
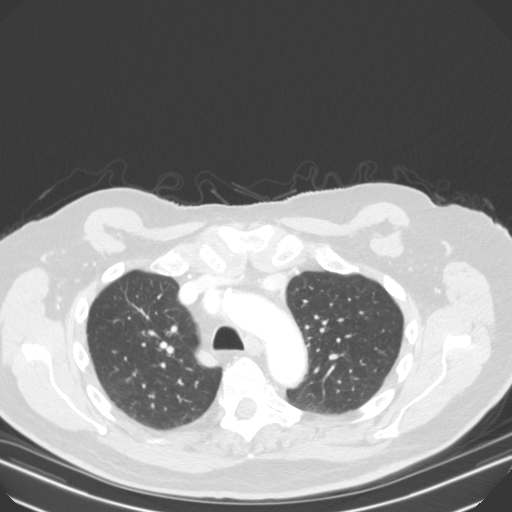
[im 124/155  lung]
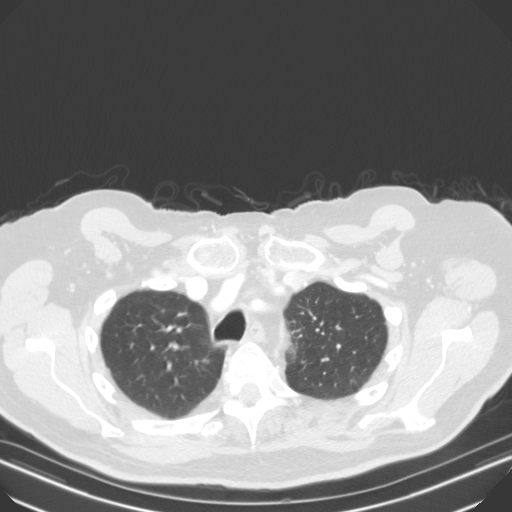
[im 132/155  mediastinal]
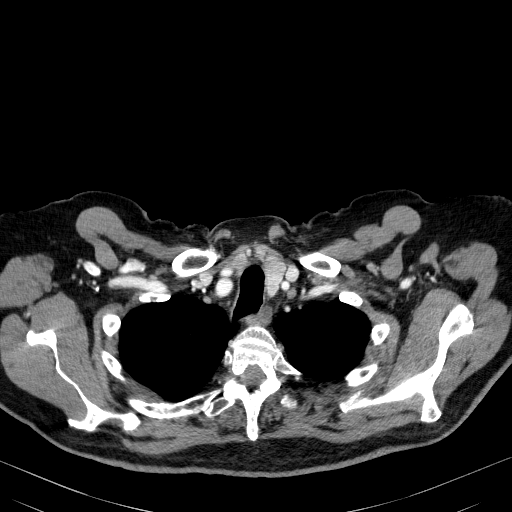
[im 132/155  lung]
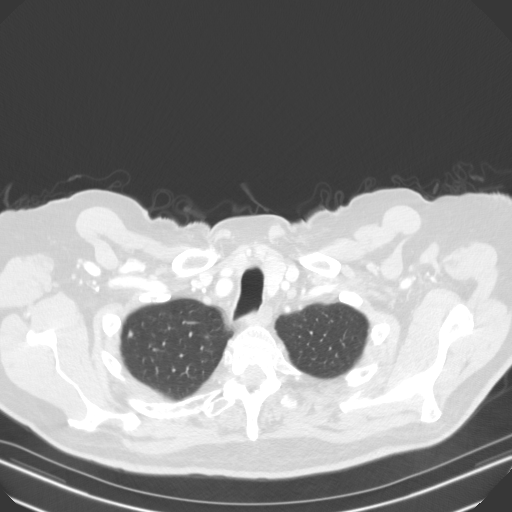
[im 143/155  lung]
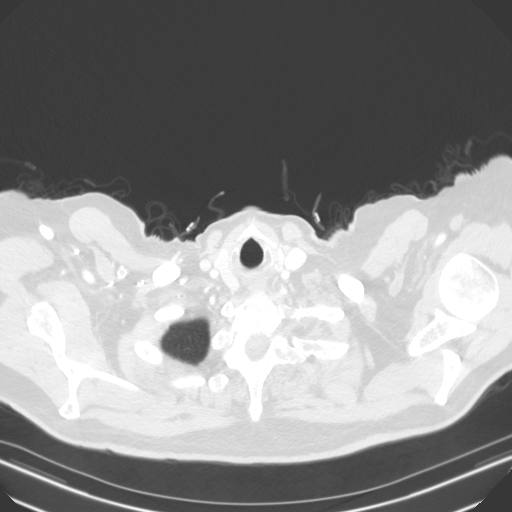

[14 of 34 positions shown; findings below may reference images not displayed]

FINDINGS: Cardiovascular: There is mild cardiomegaly. There is 3 vessel
coronary vascular calcification. No pericardial effusion. There is
moderate atherosclerotic calcification of the thoracic aorta. No
aneurysmal dilatation or dissection. The origins of the great
vessels of the aortic arch appear patent as visualized. The central
pulmonary arteries are grossly unremarkable for the degree of.

Mediastinum/Nodes: There is a mildly enlarged left hilar lymph node
measuring 13 x 16 mm with a focus calcification. No mediastinal
adenopathy. The esophagus and thyroid grossly. No mediastinal fluid
collection.

Lungs/Pleura: Lobulated soft tissue mass in the upper lobe
anteriorly measures 7.8 x 6.8 cm in greatest axial dimensions and
5.1 cm in craniocaudal length. The mass abuts the left anterior
pleural surface and probably extends into the left third intercostal
space. The mass also abuts the left mediastinal pleura surface and
may extend into the left hilum. A 5 mm calcified nodule in the left
upper lobe, likely a granuloma. A 3 mm nodule is noted in the right
upper lobe ([DATE]). There are bibasilar linear atelectasis/scarring.
No pleural effusion pneumothorax. The central airways are patent

Upper Abdomen: Several subcentimeter splenic hypodense lesions are
characterized on this CT. There are partially visualized bilateral
renal solid masses most concerning for malignancy (primary versus
metastatic disease). These measure up to 4.3 x 2.9 cm the right.
Dedicated renal mass protocol MRI is recommended further evaluation.
Bilateral renal cysts noted. There is a 1 cm hypodense lesion in the
distal body of the pancreas, indeterminate. Attention on MRI
recommended.

Musculoskeletal: Degenerative changes of spine. No acute pathology.
No suspicious osseous lesions.
IMPRESSION: 1. Lobulated soft tissue mass in the left upper lobe most concerning
for malignancy. Multidisciplinary consult is advised.
2. Mildly enlarged left hilar lymph node.
3. Bilateral renal solid masses most concerning for malignancy
(primary versus metastatic disease). Dedicated renal mass protocol
MRI is recommended further evaluation.
4. A 1 cm hypodense lesion in the distal body of the pancreas,
indeterminate. Attention on MRI recommended.
5. Aortic Atherosclerosis ([XF]-[XF]).

These results will be called to the ordering clinician or
representative by the Radiologist Assistant, and communication
documented in the PACS or [REDACTED].

## 2020-07-23 MED ORDER — IOPAMIDOL (ISOVUE-300) INJECTION 61%
75.0000 mL | Freq: Once | INTRAVENOUS | Status: AC | PRN
  Administered 2020-07-23: 75 mL via INTRAVENOUS

## 2020-07-31 ENCOUNTER — Telehealth: Payer: Self-pay | Admitting: Internal Medicine

## 2020-07-31 NOTE — Telephone Encounter (Signed)
I received a call back from Gabriel [name] to confirm his appt w/Dr. Julien Nordmann on 5/9 at 2:15pm w/labs at 1:45pm. Pt and his wife are aware to arrive 15 minutes early.

## 2020-08-12 ENCOUNTER — Other Ambulatory Visit: Payer: Self-pay

## 2020-08-12 ENCOUNTER — Inpatient Hospital Stay: Payer: Medicare Other

## 2020-08-12 ENCOUNTER — Inpatient Hospital Stay: Payer: Medicare Other | Attending: Internal Medicine | Admitting: Internal Medicine

## 2020-08-12 ENCOUNTER — Other Ambulatory Visit: Payer: Self-pay | Admitting: Medical Oncology

## 2020-08-12 ENCOUNTER — Encounter: Payer: Self-pay | Admitting: *Deleted

## 2020-08-12 VITALS — BP 116/68 | HR 76 | Temp 98.4°F | Resp 17 | Wt 192.1 lb

## 2020-08-12 DIAGNOSIS — C7931 Secondary malignant neoplasm of brain: Secondary | ICD-10-CM | POA: Insufficient documentation

## 2020-08-12 DIAGNOSIS — Z7952 Long term (current) use of systemic steroids: Secondary | ICD-10-CM | POA: Diagnosis not present

## 2020-08-12 DIAGNOSIS — Z8042 Family history of malignant neoplasm of prostate: Secondary | ICD-10-CM

## 2020-08-12 DIAGNOSIS — C3412 Malignant neoplasm of upper lobe, left bronchus or lung: Secondary | ICD-10-CM | POA: Insufficient documentation

## 2020-08-12 DIAGNOSIS — Z79899 Other long term (current) drug therapy: Secondary | ICD-10-CM | POA: Diagnosis not present

## 2020-08-12 DIAGNOSIS — I10 Essential (primary) hypertension: Secondary | ICD-10-CM | POA: Insufficient documentation

## 2020-08-12 DIAGNOSIS — R609 Edema, unspecified: Secondary | ICD-10-CM | POA: Insufficient documentation

## 2020-08-12 DIAGNOSIS — D49512 Neoplasm of unspecified behavior of left kidney: Secondary | ICD-10-CM | POA: Insufficient documentation

## 2020-08-12 DIAGNOSIS — Z8673 Personal history of transient ischemic attack (TIA), and cerebral infarction without residual deficits: Secondary | ICD-10-CM | POA: Diagnosis not present

## 2020-08-12 DIAGNOSIS — R918 Other nonspecific abnormal finding of lung field: Secondary | ICD-10-CM

## 2020-08-12 DIAGNOSIS — R059 Cough, unspecified: Secondary | ICD-10-CM | POA: Insufficient documentation

## 2020-08-12 DIAGNOSIS — Z923 Personal history of irradiation: Secondary | ICD-10-CM | POA: Diagnosis not present

## 2020-08-12 DIAGNOSIS — Z8051 Family history of malignant neoplasm of kidney: Secondary | ICD-10-CM

## 2020-08-12 DIAGNOSIS — Z8249 Family history of ischemic heart disease and other diseases of the circulatory system: Secondary | ICD-10-CM

## 2020-08-12 DIAGNOSIS — C3490 Malignant neoplasm of unspecified part of unspecified bronchus or lung: Secondary | ICD-10-CM

## 2020-08-12 DIAGNOSIS — N2889 Other specified disorders of kidney and ureter: Secondary | ICD-10-CM

## 2020-08-12 LAB — CMP (CANCER CENTER ONLY)
ALT: 10 U/L (ref 0–44)
AST: 12 U/L — ABNORMAL LOW (ref 15–41)
Albumin: 3.6 g/dL (ref 3.5–5.0)
Alkaline Phosphatase: 115 U/L (ref 38–126)
Anion gap: 13 (ref 5–15)
BUN: 16 mg/dL (ref 8–23)
CO2: 27 mmol/L (ref 22–32)
Calcium: 9.3 mg/dL (ref 8.9–10.3)
Chloride: 99 mmol/L (ref 98–111)
Creatinine: 1.31 mg/dL — ABNORMAL HIGH (ref 0.61–1.24)
GFR, Estimated: 53 mL/min — ABNORMAL LOW (ref >60)
Glucose, Bld: 152 mg/dL — ABNORMAL HIGH (ref 70–99)
Potassium: 3.9 mmol/L (ref 3.5–5.1)
Sodium: 139 mmol/L (ref 135–145)
Total Bilirubin: 0.4 mg/dL (ref 0.3–1.2)
Total Protein: 7 g/dL (ref 6.5–8.1)

## 2020-08-12 LAB — CBC WITH DIFFERENTIAL (CANCER CENTER ONLY)
Abs Immature Granulocytes: 0.06 10*3/uL (ref 0.00–0.07)
Basophils Absolute: 0 10*3/uL (ref 0.0–0.1)
Basophils Relative: 1 %
Eosinophils Absolute: 0.1 10*3/uL (ref 0.0–0.5)
Eosinophils Relative: 1 %
HCT: 30.5 % — ABNORMAL LOW (ref 39.0–52.0)
Hemoglobin: 9.6 g/dL — ABNORMAL LOW (ref 13.0–17.0)
Immature Granulocytes: 1 %
Lymphocytes Relative: 15 %
Lymphs Abs: 1.2 10*3/uL (ref 0.7–4.0)
MCH: 26.9 pg (ref 26.0–34.0)
MCHC: 31.5 g/dL (ref 30.0–36.0)
MCV: 85.4 fL (ref 80.0–100.0)
Monocytes Absolute: 0.6 10*3/uL (ref 0.1–1.0)
Monocytes Relative: 7 %
Neutro Abs: 6.2 10*3/uL (ref 1.7–7.7)
Neutrophils Relative %: 75 %
Platelet Count: 250 10*3/uL (ref 150–400)
RBC: 3.57 MIL/uL — ABNORMAL LOW (ref 4.22–5.81)
RDW: 15.7 % — ABNORMAL HIGH (ref 11.5–15.5)
WBC Count: 8.3 10*3/uL (ref 4.0–10.5)
nRBC: 0 % (ref 0.0–0.2)

## 2020-08-12 NOTE — Progress Notes (Signed)
I spoke with Mr and Ms. Human today.  Very nice couple.  Patient has large lung mass but needs further work up to understand if and or what type of cancer he has.  I will work with Data processing manager to get PET scan authed and scheduled.

## 2020-08-12 NOTE — Progress Notes (Signed)
[facility] [phone]   [phone]  CONSULT NOTE  REFERRING PHYSICIAN: Dr. [name]  REASON FOR CONSULTATION:  87 years old white male with a large left lung mass  HPI Gabriel [name] is a 87 y.o. male and never smoker with past medical history significant for hypertension, dyslipidemia, TIA as well as a history of oncocytic neoplasm of the left kidney that has been followed by urology for several years with no worsening of his symptoms or increase in the size.  He was followed initially by Dr. Karsten Ro and currently with Dr. Claudia Desanctis.  The patient mentioned that he has been complaining of bad cough for several weeks.  It was initially thought to represent pneumonia and chest x-ray on 06/07/2020 showed rounded/opioid opacity in the lingula suspicious for rounded pneumonia.  He was treated with a course of antibiotics and repeat chest x-ray on June 26, 2020 showed persistent and enlarging mass in the lingula estimated to measure approximately 7.7 x 6.3 x 5.6 cm highly concerning for primary bronchogenic neoplasm.  The patient had CT scan of the chest on 07/23/2020 and that showed lobulated soft tissue mass in the left upper lobe anteriorly measuring 7.8 x 6.8 x 5.1 cm.  The mass abuts the left anterior pleural surface and probably extends into the left third intercostal space.  The mass also abuts the left mediastinal pleural surface and may extend into the left hilum.  There was also a mildly enlarged left hilar lymph node measuring 1.3 x 1.6 cm with a focus calcification and no mediastinal adenopathy.  The visualized portion of the upper abdomen showed partially visualized bilateral renal solid masses most concerning for malignancy (primary versus metastatic disease).  This measured up to 4.3 x 2.9 cm on the right.  There was also a 1.0 cm hypodense lesion in the distal body of the pancreas that is indeterminate. The patient was referred to me today for  evaluation and recommendation regarding his condition. When seen today he complains of mild cough but no significant chest pain, shortness of breath or hemoptysis.  He lost around 10 pounds secondary to lack of appetite during the diagnosis of pneumonia.  He denied having any nausea, vomiting, diarrhea or constipation.  He has mild headache with no visual changes. Family history significant for mother and father with heart disease.  Father also had prostate cancer and brother had kidney cancer. The patient is married and has 2 children a daughter who lives in [place] and his son who lives in [place].  The patient was accompanied by his wife Gabriel [name].  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  He has no history of smoking but drinks alcohol occasionally and no history of drug abuse.  HPI  Past Medical History:  Diagnosis Date  . Gait disorder 02/04/2017  . High cholesterol   . Hypertension     No past surgical history on file.  Family History  Problem Relation Age of Onset  . Heart attack Mother   . Heart disease Father   . Cancer - Prostate Father   . Cancer Brother        renal    Social History Social History   Tobacco Use  . Smoking status: Never Smoker  . Smokeless tobacco: Never Used  Vaping Use  . Vaping Use: Never used  Substance Use Topics  . Alcohol use: Yes    Comment: 1 beer per week  . Drug use: No  No Known Allergies  Current Outpatient Medications  Medication Sig Dispense Refill  . amoxicillin-clavulanate (AUGMENTIN) 875-125 MG tablet Take 1 tablet by mouth every 12 (twelve) hours. 14 tablet 0  . atorvastatin (LIPITOR) 40 MG tablet Take 1 tablet (40 mg total) by mouth daily. 45 tablet 1  . clopidogrel (PLAVIX) 75 MG tablet Take 1 tablet (75 mg total) by mouth daily. 90 tablet 0  . hydrochlorothiazide (MICROZIDE) 12.5 MG capsule Take 12.5 mg by mouth daily.    Marland Kitchen lisinopril (PRINIVIL,ZESTRIL) 10 MG tablet Take 10 mg by mouth daily.   4   No current facility-administered medications for this visit.    Review of Systems  Constitutional: positive for anorexia, fatigue and weight loss Eyes: negative Ears, nose, mouth, throat, and face: negative Respiratory: positive for cough Cardiovascular: negative Gastrointestinal: negative Genitourinary:negative Integument/breast: negative Hematologic/lymphatic: negative Musculoskeletal:negative Neurological: negative Behavioral/Psych: negative Endocrine: negative Allergic/Immunologic: negative  Physical Exam  PJK:DTOIZ, healthy, no distress, well nourished and well developed SKIN: skin color, texture, turgor are normal, no rashes or significant lesions HEAD: Normocephalic, No masses, lesions, tenderness or abnormalities EYES: normal, PERRLA, Conjunctiva are pink and non-injected EARS: External ears normal, Canals clear OROPHARYNX:no exudate, no erythema and lips, buccal mucosa, and tongue normal  NECK: supple, no adenopathy, no JVD LYMPH:  no palpable lymphadenopathy, no hepatosplenomegaly LUNGS: coarse sounds heard, decreased breath sounds HEART: regular rate & rhythm, no murmurs and no gallops ABDOMEN:abdomen soft, non-tender, normal bowel sounds and no masses or organomegaly BACK: No CVA tenderness, Range of motion is normal EXTREMITIES:no joint deformities, effusion, or inflammation, no edema  NEURO: alert & oriented x 3 with fluent speech, no focal motor/sensory deficits  PERFORMANCE STATUS: ECOG 1  LABORATORY DATA: Lab Results  Component Value Date   WBC 8.3 08/12/2020   HGB 9.6 (L) 08/12/2020   HCT 30.5 (L) 08/12/2020   MCV 85.4 08/12/2020   PLT 250 08/12/2020      Chemistry      Component Value Date/Time   NA 139 04/13/2019 1235   K 4.1 04/13/2019 1235   CL 104 04/13/2019 1235   CO2 25 04/13/2019 1223   BUN 25 (H) 04/13/2019 1235   CREATININE 1.20 04/13/2019 1235      Component Value Date/Time   CALCIUM 9.0 04/13/2019 1223   ALKPHOS 77  04/13/2019 1223   AST 24 04/13/2019 1223   ALT 20 04/13/2019 1223   BILITOT 0.4 04/13/2019 1223       RADIOGRAPHIC STUDIES: CT CHEST W CONTRAST  Result Date: 07/23/2020 CLINICAL DATA:  87 year old male with left upper lobe lung cancer. EXAM: CT CHEST WITH CONTRAST TECHNIQUE: Multidetector CT imaging of the chest was performed during intravenous contrast administration. CONTRAST:  53mL ISOVUE-300 IOPAMIDOL (ISOVUE-300) INJECTION 61% COMPARISON:  Chest radiograph dated 06/26/2020. FINDINGS: Cardiovascular: There is mild cardiomegaly. There is 3 vessel coronary vascular calcification. No pericardial effusion. There is moderate atherosclerotic calcification of the thoracic aorta. No aneurysmal dilatation or dissection. The origins of the great vessels of the aortic arch appear patent as visualized. The central pulmonary arteries are grossly unremarkable for the degree of. Mediastinum/Nodes: There is a mildly enlarged left hilar lymph node measuring 13 x 16 mm with a focus calcification. No mediastinal adenopathy. The esophagus and thyroid grossly. No mediastinal fluid collection. Lungs/Pleura: Lobulated soft tissue mass in the upper lobe anteriorly measures 7.8 x 6.8 cm in greatest axial dimensions and 5.1 cm in craniocaudal length. The mass abuts the left anterior pleural surface and probably extends into the  left third intercostal space. The mass also abuts the left mediastinal pleura surface and may extend into the left hilum. A 5 mm calcified nodule in the left upper lobe, likely a granuloma. A 3 mm nodule is noted in the right upper lobe (24/5). There are bibasilar linear atelectasis/scarring. No pleural effusion pneumothorax. The central airways are patent Upper Abdomen: Several subcentimeter splenic hypodense lesions are characterized on this CT. There are partially visualized bilateral renal solid masses most concerning for malignancy (primary versus metastatic disease). These measure up to 4.3 x 2.9  cm the right. Dedicated renal mass protocol MRI is recommended further evaluation. Bilateral renal cysts noted. There is a 1 cm hypodense lesion in the distal body of the pancreas, indeterminate. Attention on MRI recommended. Musculoskeletal: Degenerative changes of spine. No acute pathology. No suspicious osseous lesions. IMPRESSION: 1. Lobulated soft tissue mass in the left upper lobe most concerning for malignancy. Multidisciplinary consult is advised. 2. Mildly enlarged left hilar lymph node. 3. Bilateral renal solid masses most concerning for malignancy (primary versus metastatic disease). Dedicated renal mass protocol MRI is recommended further evaluation. 4. A 1 cm hypodense lesion in the distal body of the pancreas, indeterminate. Attention on MRI recommended. 5. Aortic Atherosclerosis (ICD10-I70.0). These results will be called to the ordering clinician or representative by the Radiologist Assistant, and communication documented in the PACS or [employer]. Electronically Signed   By: Anner Crete M.D.   On: 07/23/2020 23:26    ASSESSMENT: This is a very pleasant 87 years old white male history of oncogenic neoplasm of the left kidney that has been followed for years by urology and the patient presented today with a large left upper lobe lung mass with possible extension to the chest wall as well as the left hilum and mediastinal pleura as well as bilateral solid renal masses.   PLAN: I had a lengthy discussion with the patient and his wife today about his current condition and further investigation to confirm his diagnosis. I personally and independently reviewed the scan images and discussed the result and showed the images to the patient and his wife. His disease is highly suspicious for metastatic renal neoplasm but primary lung cancer could not be excluded at this point. I recommended for the patient to complete the staging work-up by ordering a PET scan as well as MRI of the brain to  rule out any other metastatic disease. I will also refer the patient to pulmonary medicine for consideration of bronchoscopy with endobronchial ultrasound and biopsy of the left upper lobe lung mass and left hilum. I will arrange for the patient to come back for follow-up visit in around 2 weeks for more detailed discussion of his treatment options based on the final pathology and staging work-up. The patient was advised to call immediately if he has any other concerning symptoms in the interval.  The patient voices understanding of current disease status and treatment options and is in agreement with the current care plan.  All questions were answered. The patient knows to call the clinic with any problems, questions or concerns. We can certainly see the patient much sooner if necessary.  Thank you so much for allowing me to participate in the care of Gabriel [name]. I will continue to follow up the patient with you and assist in his care.  The total time spent in the appointment was 60 minutes.  Disclaimer: This note was dictated with voice recognition software. Similar sounding words can inadvertently be transcribed  and may not be corrected upon review.   Eilleen Kempf Aug 12, 2020, 2:52 PM

## 2020-08-13 ENCOUNTER — Telehealth: Payer: Self-pay | Admitting: Medical Oncology

## 2020-08-13 ENCOUNTER — Telehealth: Payer: Self-pay | Admitting: Internal Medicine

## 2020-08-13 NOTE — Telephone Encounter (Signed)
Scheduled per los. Called and spoke with patient. Confirmed appt 

## 2020-08-13 NOTE — Telephone Encounter (Signed)
Hx Melanoma- Wife called to report that pt had a melanoma on his stomach 6-8 years ago ( Dr Ubaldo Glassing).She did not remember that yesterday when pt saw [facility].

## 2020-08-15 ENCOUNTER — Telehealth: Payer: Self-pay | Admitting: Pulmonary Disease

## 2020-08-15 ENCOUNTER — Telehealth: Payer: Self-pay | Admitting: Medical Oncology

## 2020-08-15 DIAGNOSIS — R918 Other nonspecific abnormal finding of lung field: Secondary | ICD-10-CM

## 2020-08-15 NOTE — Telephone Encounter (Signed)
PCCM:  Patient scheduled to see me on Monday.  Bronch tentatively scheduled for Tuesday 17th.  Patient holding plavix.  I discussed with patient at wife on phone.   [facility] 08/15/2020 6:16 PM

## 2020-08-15 NOTE — Telephone Encounter (Signed)
Pt waiting to hear about referral to pulmonary. Message sent to HIM.

## 2020-08-15 NOTE — Telephone Encounter (Signed)
Error

## 2020-08-16 ENCOUNTER — Other Ambulatory Visit: Payer: Self-pay

## 2020-08-16 ENCOUNTER — Encounter (HOSPITAL_COMMUNITY)
Admission: RE | Admit: 2020-08-16 | Discharge: 2020-08-16 | Disposition: A | Discharge status: Home / Self Care | Payer: Medicare Other | Source: Ambulatory Visit | Attending: Internal Medicine | Admitting: Internal Medicine

## 2020-08-16 ENCOUNTER — Telehealth: Payer: Self-pay | Admitting: Medical Oncology

## 2020-08-16 ENCOUNTER — Other Ambulatory Visit: Payer: Self-pay | Admitting: Internal Medicine

## 2020-08-16 DIAGNOSIS — C3490 Malignant neoplasm of unspecified part of unspecified bronchus or lung: Secondary | ICD-10-CM | POA: Insufficient documentation

## 2020-08-16 DIAGNOSIS — R918 Other nonspecific abnormal finding of lung field: Secondary | ICD-10-CM

## 2020-08-16 IMAGING — MR MR HEAD WO/W CM
14 series · 48 of 48 positions shown · IV contrast (agent unspecified)
Comparison: [DATE]

CLINICAL DATA: Non-small cell lung cancer stage

EXAM:
MRI HEAD WITHOUT AND WITH CONTRAST
TECHNIQUE: Multiplanar, multiecho pulse sequences of the brain and surrounding
structures were obtained without and with intravenous contrast.
CONTRAST:  Dose is currently not known, reference EMR

[Series 5: DWI · axial · 3.0mm · 1.36mm/px · z∈[-56,+98]mm · 6 of 112 slices shown (1 of 2)]
[im 1/112]
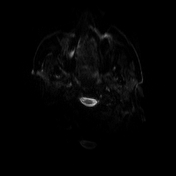
[im 23/112]
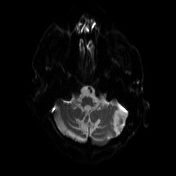
[im 45/112]
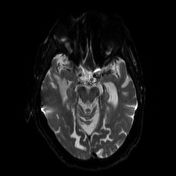
[im 67/112]
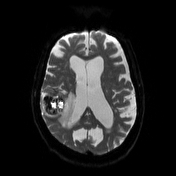
[im 89/112]
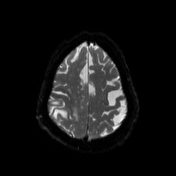
[im 112/112]
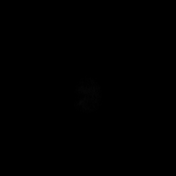

[Series 6: DWI · axial · 3.0mm · 1.36mm/px · z∈[-56,+98]mm · 3 of 56 slices shown (2 of 2)]
[im 1/56]
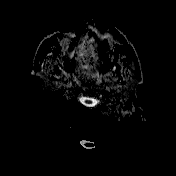
[im 28/56]
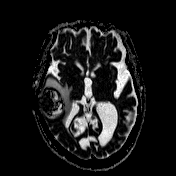
[im 56/56]
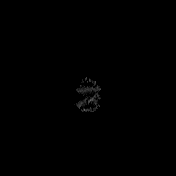

[Series 7: T1 · sagittal · 5.0mm · 0.75mm/px · 1 of 24 slices shown (1 of 2)]
[im 1/24]
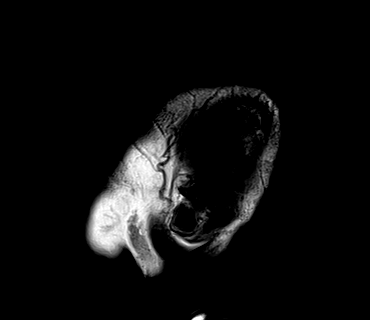

[Series 8: T2 · axial · 5.0mm · 0.62mm/px · z∈[-60,+104]mm · 2 of 28 slices shown]
[im 1/28]
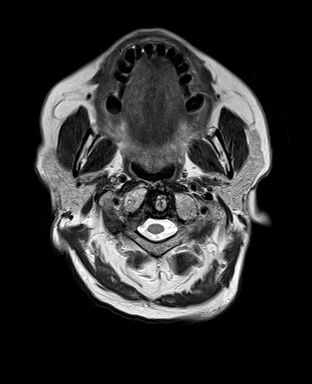
[im 28/28]
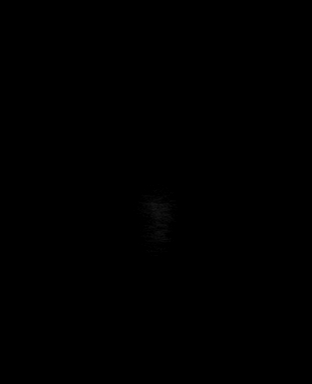

[Series 9: mip_images(sw) · axial · 24.0mm · 0.75mm/px · z∈[-51,+95]mm · 3 of 53 slices shown]
[im 1/53]
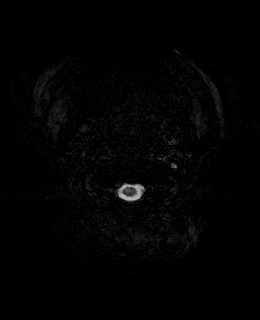
[im 27/53]
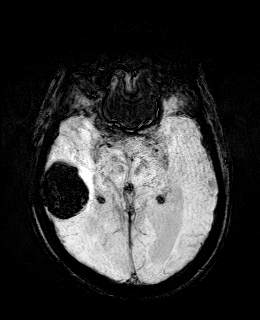
[im 53/53]
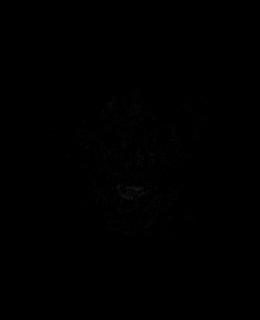

[Series 10: swi_images · axial · 3.0mm · 0.75mm/px · z∈[-61,+105]mm · 4 of 60 slices shown]
[im 1/60]
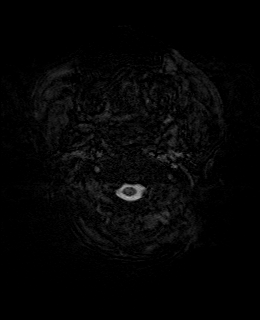
[im 20/60]
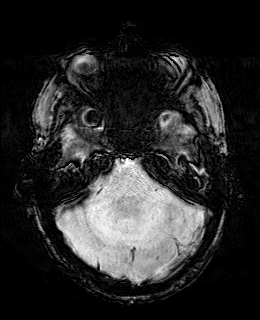
[im 40/60]
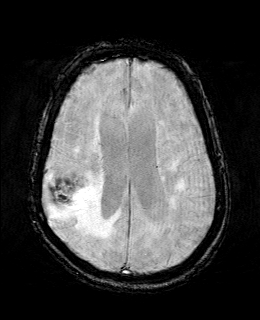
[im 60/60]
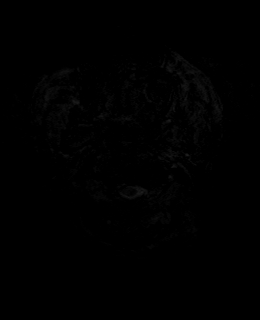

[Series 11: FLAIR · axial · 3.0mm · 0.75mm/px · z∈[-58,+102]mm · 4 of 58 slices shown]
[im 1/58]
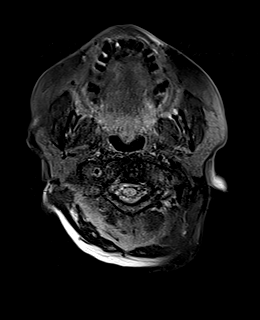
[im 20/58]
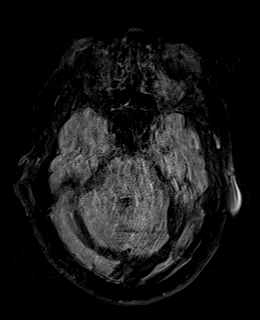
[im 39/58]
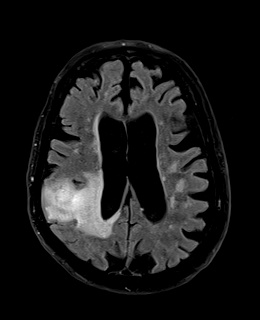
[im 58/58]
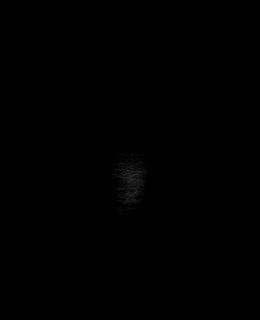

[Series 12: cor dwi_tracew · coronal · 5.0mm · 1.53mm/px · 4 of 58 slices shown]
[im 1/58]
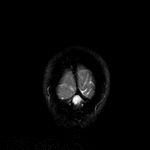
[im 20/58]
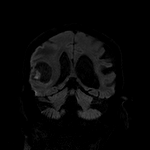
[im 39/58]
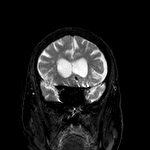
[im 58/58]
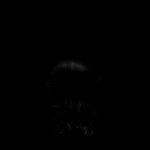

[Series 13: cor dwi_adc · coronal · 5.0mm · 1.53mm/px · 2 of 29 slices shown]
[im 1/29]
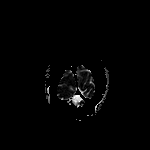
[im 29/29]
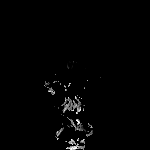

[Series 14: T1 · axial · 1.0mm · 0.94mm/px · z∈[-65,+102]mm · 11 of 176 slices shown (2 of 2)]
[im 1/176]
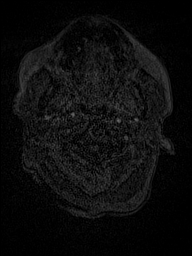
[im 18/176]
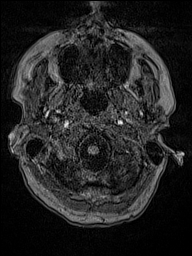
[im 36/176]
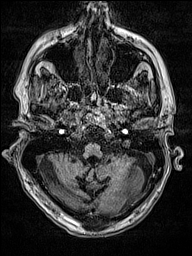
[im 53/176]
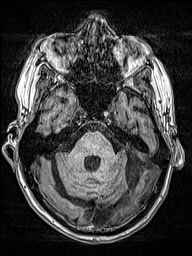
[im 71/176]
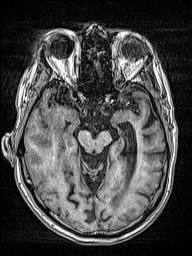
[im 88/176]
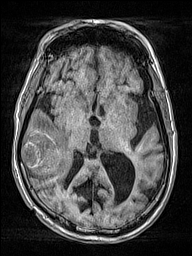
[im 106/176]
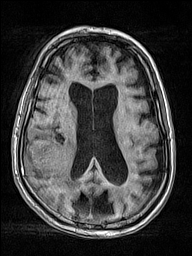
[im 123/176]
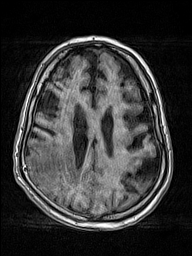
[im 141/176]
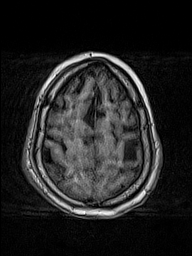
[im 158/176]
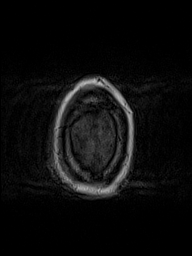
[im 176/176]
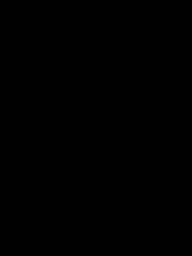

[Series 15: T2 post-contrast · coronal · 5.0mm · 0.57mm/px · 2 of 30 slices shown]
[im 1/30]
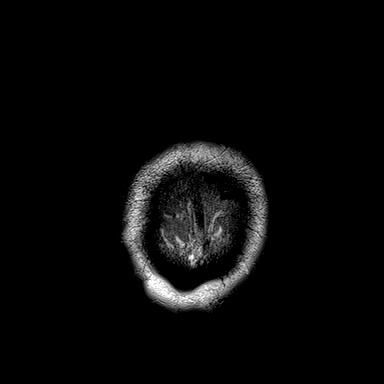
[im 30/30]
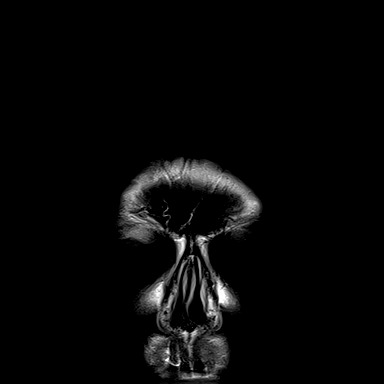

[Series 16: T1 post-contrast · axial · 3.0mm · 0.45mm/px · z∈[-52,+102]mm · 3 of 56 slices shown (1 of 3)]
[im 1/56]
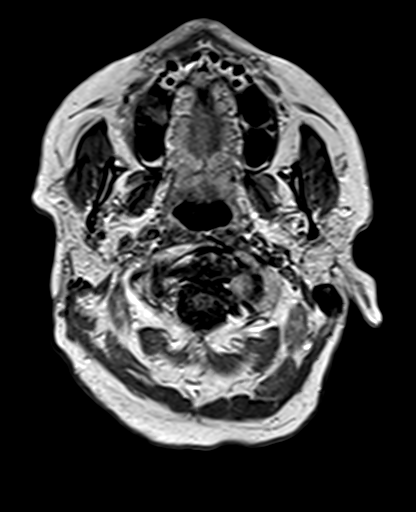
[im 28/56]
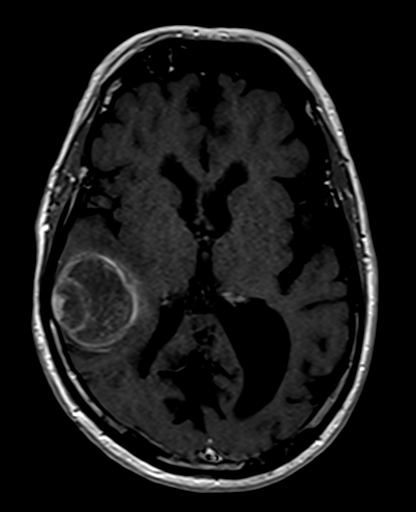
[im 56/56]
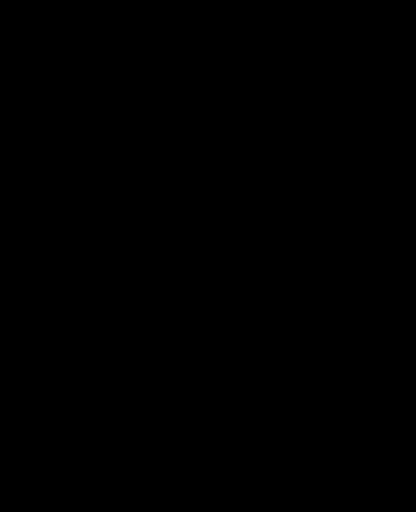

[Series 17: T1 post-contrast · coronal · 5.0mm · 0.43mm/px · 2 of 30 slices shown (2 of 3)]
[im 1/30]
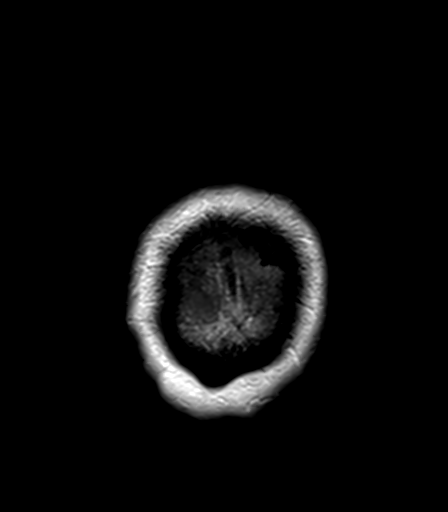
[im 30/30]
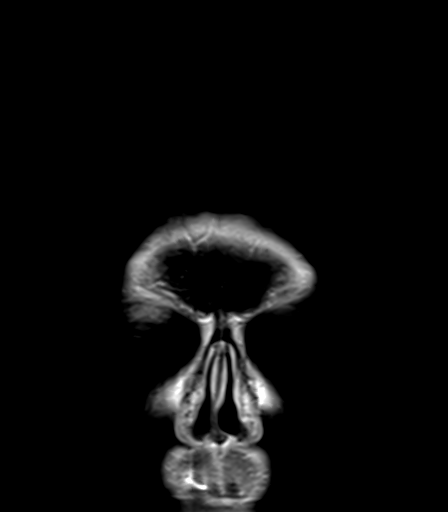

[Series 18: T1 post-contrast · sagittal · 5.0mm · 0.75mm/px · 1 of 24 slices shown (3 of 3)]
[im 1/24]
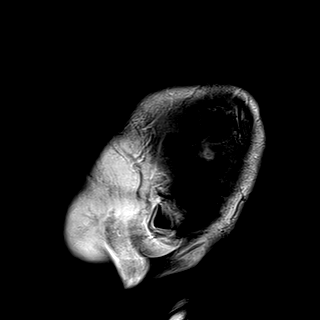

[48 of 48 positions shown; findings below may reference images not displayed]

FINDINGS: Brain: Heterogeneous mass with areas of T1 hyperintensity and
enhancement in the posterior and superior right temporal lobe
measuring up to 4.2 cm with moderate to extensive adjacent vasogenic
edema. There is local mass effect without midline shift.

Second cortically based focus of nodular enhancement measuring 7 mm
at the superior right frontal lobe.

Notable brain atrophy. No obstructive hydrocephalus or extra-axial
collection.

Vascular: Normal flow voids

Skull and upper cervical spine: Normal marrow signal

Sinuses/Orbits: Negative

A is being placed to the ordering provider for patient disposition.
IMPRESSION: 1. Cerebral metastatic disease, most notably a 4.2 cm hemorrhagic
metastasis in the superior and posterior right temporal lobe with
extensive vasogenic edema.
2. Second metastatic lesion measuring 7 mm at the right vertex.

## 2020-08-16 MED ORDER — DEXAMETHASONE 4 MG PO TABS: 4.0000 mg | ORAL_TABLET | Freq: Four times a day (QID) | ORAL | 0 refills | Status: DC

## 2020-08-16 MED ORDER — GADOBUTROL 1 MMOL/ML IV SOLN
8.0000 mL | Freq: Once | INTRAVENOUS | Status: AC | PRN
  Administered 2020-08-16: 8 mL via INTRAVENOUS

## 2020-08-16 NOTE — Telephone Encounter (Signed)
LVM on pts home phone.

## 2020-08-16 NOTE — Telephone Encounter (Signed)
-----   Message from Curt Bears, MD sent at 08/16/2020  8:36 AM EDT ----- Please call the patient and let him know that I sent prescription for Decadron to his pharmacy at [place] to take 4 times a day.  I also made a referral for him to see radiation oncology as soon as possible because of the brain lesion seen on the MRI of the brain earlier today.  Thank you.

## 2020-08-19 ENCOUNTER — Other Ambulatory Visit (HOSPITAL_COMMUNITY)
Admission: RE | Admit: 2020-08-19 | Discharge: 2020-08-19 | Disposition: A | Discharge status: Home / Self Care | Payer: Medicare Other | Source: Ambulatory Visit | Attending: Pulmonary Disease | Admitting: Pulmonary Disease

## 2020-08-19 ENCOUNTER — Encounter: Payer: Self-pay | Admitting: Pulmonary Disease

## 2020-08-19 ENCOUNTER — Ambulatory Visit: Payer: Medicare Other | Admitting: Pulmonary Disease

## 2020-08-19 ENCOUNTER — Other Ambulatory Visit: Payer: Self-pay

## 2020-08-19 ENCOUNTER — Encounter (HOSPITAL_COMMUNITY): Payer: Self-pay | Admitting: Pulmonary Disease

## 2020-08-19 ENCOUNTER — Other Ambulatory Visit (HOSPITAL_COMMUNITY): Payer: Medicare Other

## 2020-08-19 ENCOUNTER — Other Ambulatory Visit: Payer: Self-pay | Admitting: Radiation Therapy

## 2020-08-19 VITALS — BP 142/70 | HR 60 | Ht 70.0 in | Wt 196.0 lb

## 2020-08-19 DIAGNOSIS — R918 Other nonspecific abnormal finding of lung field: Secondary | ICD-10-CM | POA: Diagnosis not present

## 2020-08-19 DIAGNOSIS — Z7902 Long term (current) use of antithrombotics/antiplatelets: Secondary | ICD-10-CM | POA: Diagnosis not present

## 2020-08-19 DIAGNOSIS — N2889 Other specified disorders of kidney and ureter: Secondary | ICD-10-CM | POA: Diagnosis not present

## 2020-08-19 DIAGNOSIS — Z8673 Personal history of transient ischemic attack (TIA), and cerebral infarction without residual deficits: Secondary | ICD-10-CM | POA: Diagnosis not present

## 2020-08-19 DIAGNOSIS — Z01812 Encounter for preprocedural laboratory examination: Secondary | ICD-10-CM | POA: Insufficient documentation

## 2020-08-19 DIAGNOSIS — Z20822 Contact with and (suspected) exposure to covid-19: Secondary | ICD-10-CM | POA: Insufficient documentation

## 2020-08-19 LAB — SARS CORONAVIRUS 2 (TAT 6-24 HRS): SARS Coronavirus 2: NEGATIVE

## 2020-08-19 NOTE — Progress Notes (Signed)
Synopsis: Referred in May 2022 for lung mass by [name], MD  Subjective:   PATIENT ID: Gabriel [name] GENDER: male DOB: [birth year], MRN: [id number]  Chief Complaint  Patient presents with  . Follow-up    87 year old gentleman, past medical history of hypertension, TIA, never smoker.  Is on atorvastatin, Plavix.  Patient was called last week and asked to hold Plavix dosing.  With intentions for plans of bronchoscopy.  Patient here today to discuss planned bronchoscopy after finding a large lung mass.  CT scan was completed on 07/23/2020.  CT scan reveals a left upper lobe mass that is 7.8 x 6.8 cm in size.  A small left hilar node was also identified.  Additionally there was renal masses concerning for primary renal disease versus metastatic disease.  Patient was referred to pulmonary for tissue biopsy of the lung mass to start work-up on differentiation from both locations of tumor.    Past Medical History:  Diagnosis Date  . Gait disorder 02/04/2017  . High cholesterol   . Hypertension      Family History  Problem Relation Age of Onset  . Heart attack Mother   . Heart disease Father   . Cancer - Prostate Father   . Cancer Brother        renal     No past surgical history on file.  Social History   Socioeconomic History  . Marital status: Married    Spouse name: Gabriel [name]  . Number of children: 2  . Years of education: 52  . Highest education [name]: Not on file  Occupational History  . Occupation: part time  Tobacco Use  . Smoking status: Never Smoker  . Smokeless tobacco: Never Used  Vaping Use  . Vaping Use: Never used  Substance and Sexual Activity  . Alcohol use: Yes    Comment: 1 beer per week  . Drug use: No  . Sexual activity: Not on file  Other Topics Concern  . Not on file  Social History Narrative   Lives with wife   Caffeine use: rare   Right handed    Social Determinants of Health   Financial Resource Strain: Not on file  Food  Insecurity: Not on file  Transportation Needs: Not on file  Physical Activity: Not on file  Stress: Not on file  Social Connections: Not on file  Intimate Partner Violence: Not on file     No Known Allergies   Outpatient Medications Prior to Visit  Medication Sig Dispense Refill  . atorvastatin (LIPITOR) 40 MG tablet Take 1 tablet (40 mg total) by mouth daily. 45 tablet 1  . clopidogrel (PLAVIX) 75 MG tablet Take 1 tablet (75 mg total) by mouth daily. 90 tablet 0  . dexamethasone (DECADRON) 4 MG tablet Take 1 tablet (4 mg total) by mouth 4 (four) times daily. 40 tablet 0  . hydrochlorothiazide (MICROZIDE) 12.5 MG capsule Take 12.5 mg by mouth daily.    Marland Kitchen HYDROcodone-acetaminophen (NORCO/VICODIN) 5-325 MG tablet Take 1 tablet by mouth daily as needed for moderate pain or severe pain.    Marland Kitchen lisinopril (PRINIVIL,ZESTRIL) 10 MG tablet Take 10 mg by mouth daily.  4   No facility-administered medications prior to visit.    Review of Systems  Constitutional: Negative for chills, fever, malaise/fatigue and weight loss.  HENT: Negative for hearing loss, sore throat and tinnitus.   Eyes: Negative for blurred vision and double vision.  Respiratory: Negative for cough, hemoptysis, sputum production, shortness of  breath, wheezing and stridor.   Cardiovascular: Negative for chest pain, palpitations, orthopnea, leg swelling and PND.  Gastrointestinal: Negative for abdominal pain, constipation, diarrhea, heartburn, nausea and vomiting.  Genitourinary: Negative for dysuria, hematuria and urgency.  Musculoskeletal: Negative for joint pain and myalgias.  Skin: Negative for itching and rash.  Neurological: Negative for dizziness, tingling, weakness and headaches.  Endo/Heme/Allergies: Negative for environmental allergies. Does not bruise/bleed easily.  Psychiatric/Behavioral: Negative for depression. The patient is not nervous/anxious and does not have insomnia.   All other systems reviewed and are  negative.    Objective:  Physical Exam Vitals reviewed.  Constitutional:      General: He is not in acute distress.    Appearance: He is well-developed.  HENT:     Head: Normocephalic and atraumatic.  Eyes:     General: No scleral icterus.    Conjunctiva/sclera: Conjunctivae normal.     Pupils: Pupils are equal, round, and reactive to light.  Neck:     Vascular: No JVD.     Trachea: No tracheal deviation.  Cardiovascular:     Rate and Rhythm: Normal rate and regular rhythm.     Heart sounds: Normal heart sounds. No murmur heard.   Pulmonary:     Effort: Pulmonary effort is normal. No tachypnea, accessory muscle usage or respiratory distress.     Breath sounds: No stridor. No wheezing, rhonchi or rales.     Comments: Diminished in the LUL  Abdominal:     General: Bowel sounds are normal. There is no distension.     Palpations: Abdomen is soft.     Tenderness: There is no abdominal tenderness.  Musculoskeletal:        General: No tenderness.     Cervical back: Neck supple.  Lymphadenopathy:     Cervical: No cervical adenopathy.  Skin:    General: Skin is warm and dry.     Capillary Refill: Capillary refill takes less than 2 seconds.     Findings: No rash.  Neurological:     Mental Status: He is alert and oriented to person, place, and time.  Psychiatric:        Behavior: Behavior normal.      Vitals:   08/19/20 1005  BP: (!) 142/70  Pulse: 60  SpO2: 96%  Weight: 196 lb (88.9 kg)  Height: $Remove'5\' 10"'zkppwGU$  (1.778 m)   96% on RA BMI Readings from Last 3 Encounters:  08/19/20 28.12 kg/m  08/12/20 27.56 kg/m  01/23/20 29.13 kg/m   Wt Readings from Last 3 Encounters:  08/19/20 196 lb (88.9 kg)  08/12/20 192 lb 1.6 oz (87.1 kg)  01/23/20 203 lb (92.1 kg)     CBC    Component Value Date/Time   WBC 8.3 08/12/2020 1434   WBC 7.4 04/13/2019 1223   RBC 3.57 (L) 08/12/2020 1434   HGB 9.6 (L) 08/12/2020 1434   HCT 30.5 (L) 08/12/2020 1434   PLT 250 08/12/2020  1434   MCV 85.4 08/12/2020 1434   MCH 26.9 08/12/2020 1434   MCHC 31.5 08/12/2020 1434   RDW 15.7 (H) 08/12/2020 1434   LYMPHSABS 1.2 08/12/2020 1434   MONOABS 0.6 08/12/2020 1434   EOSABS 0.1 08/12/2020 1434   BASOSABS 0.0 08/12/2020 1434     Chest Imaging: CT Chest 07/23/2020: Left upper lobe mass concerning for primary bronchogenic carcinoma. The patient's images have been independently reviewed by me.    Pulmonary Functions Testing Results: No flowsheet data found.  FeNO:   Pathology:  Echocardiogram:   Heart Catheterization:     Assessment & Plan:     ICD-10-CM   1. Lung mass  R91.8   2. Bilateral renal masses  N28.89   3. H/O TIA (transient ischemic attack)  Z86.73   4. Antiplatelet or antithrombotic long-term use  Z79.02     Discussion:  87 year old gentleman with large left upper lobe mass, renal masses concerning either for 2 primaries versus metastatic disease.  CT imaging reviewed today in the office.  Patient has already established care with medical oncology.  Radiation oncology has also been referred.  A PET scan has been ordered.  Plan: Today in the office we discussed risk benefits and alternatives of proceeding with bronchoscopy. We will plan for video bronchoscopy with transbronchial biopsies to the upper lobe mass.  As well as ultrasound with staging to the left hilum.  Patient is on antiplatelets which have been held. We discussed the increased risk of bleeding as well as pneumothorax.  The bleeding risk is slightly increased even though he has been off of Plavix. Patient's wife and patient are understanding of the risk.  And willing to proceed. Patient's case is scheduled for tomorrow. Patient is to eat nothing past midnight tonight or drink. We will see him at the hospital tomorrow.  Patient met with [facility], patient care coordinator prior to discharge from the office to discuss appointments and neck steps.   Current Outpatient Medications:  .   atorvastatin (LIPITOR) 40 MG tablet, Take 1 tablet (40 mg total) by mouth daily., Disp: 45 tablet, Rfl: 1 .  clopidogrel (PLAVIX) 75 MG tablet, Take 1 tablet (75 mg total) by mouth daily., Disp: 90 tablet, Rfl: 0 .  dexamethasone (DECADRON) 4 MG tablet, Take 1 tablet (4 mg total) by mouth 4 (four) times daily., Disp: 40 tablet, Rfl: 0 .  hydrochlorothiazide (MICROZIDE) 12.5 MG capsule, Take 12.5 mg by mouth daily., Disp: , Rfl:  .  HYDROcodone-acetaminophen (NORCO/VICODIN) 5-325 MG tablet, Take 1 tablet by mouth daily as needed for moderate pain or severe pain., Disp: , Rfl:  .  lisinopril (PRINIVIL,ZESTRIL) 10 MG tablet, Take 10 mg by mouth daily., Disp: , Rfl: 4  I spent 62 minutes dedicated to the care of this patient on the date of this encounter to include pre-visit review of records, face-to-face time with the patient discussing conditions above, post visit ordering of testing, clinical documentation with the electronic health record, making appropriate referrals as documented, and communicating necessary findings to members of the patients care team.   Garner Nash, [facility] 08/19/2020 10:30 AM

## 2020-08-19 NOTE — Patient Instructions (Addendum)
Thank you for visiting Dr. Valeta Harms at [facility]. Today we recommend the following:  Case tomorrow.  Nothing to eat tonight.  Please reference you sheet of info.   Return in about 4 weeks (around 09/16/2020) for with APP or Dr. Valeta Harms.    Please do your part to reduce the spread of COVID-19.

## 2020-08-19 NOTE — H&P (View-Only) (Signed)
Synopsis: Referred in May 2022 for lung mass by [name], MD  Subjective:   PATIENT ID: Gabriel [name] GENDER: male DOB: [birth year], MRN: [id number]  Chief Complaint  Patient presents with  . Follow-up    87 year old gentleman, past medical history of hypertension, TIA, never smoker.  Is on atorvastatin, Plavix.  Patient was called last week and asked to hold Plavix dosing.  With intentions for plans of bronchoscopy.  Patient here today to discuss planned bronchoscopy after finding a large lung mass.  CT scan was completed on 07/23/2020.  CT scan reveals a left upper lobe mass that is 7.8 x 6.8 cm in size.  A small left hilar node was also identified.  Additionally there was renal masses concerning for primary renal disease versus metastatic disease.  Patient was referred to pulmonary for tissue biopsy of the lung mass to start work-up on differentiation from both locations of tumor.    Past Medical History:  Diagnosis Date  . Gait disorder 02/04/2017  . High cholesterol   . Hypertension      Family History  Problem Relation Age of Onset  . Heart attack Mother   . Heart disease Father   . Cancer - Prostate Father   . Cancer Brother        renal     No past surgical history on file.  Social History   Socioeconomic History  . Marital status: Married    Spouse name: [name]  . Number of children: 2  . Years of education: 59  . Highest education level: Not on file  Occupational History  . Occupation: part time  Tobacco Use  . Smoking status: Never Smoker  . Smokeless tobacco: Never Used  Vaping Use  . Vaping Use: Never used  Substance and Sexual Activity  . Alcohol use: Yes    Comment: 1 beer per week  . Drug use: No  . Sexual activity: Not on file  Other Topics Concern  . Not on file  Social History Narrative   Lives with wife   Caffeine use: rare   Right handed    Social Determinants of Health   Financial Resource Strain: Not on file  Food  Insecurity: Not on file  Transportation Needs: Not on file  Physical Activity: Not on file  Stress: Not on file  Social Connections: Not on file  Intimate Partner Violence: Not on file     No Known Allergies   Outpatient Medications Prior to Visit  Medication Sig Dispense Refill  . atorvastatin (LIPITOR) 40 MG tablet Take 1 tablet (40 mg total) by mouth daily. 45 tablet 1  . clopidogrel (PLAVIX) 75 MG tablet Take 1 tablet (75 mg total) by mouth daily. 90 tablet 0  . dexamethasone (DECADRON) 4 MG tablet Take 1 tablet (4 mg total) by mouth 4 (four) times daily. 40 tablet 0  . hydrochlorothiazide (MICROZIDE) 12.5 MG capsule Take 12.5 mg by mouth daily.    Marland Kitchen HYDROcodone-acetaminophen (NORCO/VICODIN) 5-325 MG tablet Take 1 tablet by mouth daily as needed for moderate pain or severe pain.    Marland Kitchen lisinopril (PRINIVIL,ZESTRIL) 10 MG tablet Take 10 mg by mouth daily.  4   No facility-administered medications prior to visit.    Review of Systems  Constitutional: Negative for chills, fever, malaise/fatigue and weight loss.  HENT: Negative for hearing loss, sore throat and tinnitus.   Eyes: Negative for blurred vision and double vision.  Respiratory: Negative for cough, hemoptysis, sputum production, shortness of  breath, wheezing and stridor.   Cardiovascular: Negative for chest pain, palpitations, orthopnea, leg swelling and PND.  Gastrointestinal: Negative for abdominal pain, constipation, diarrhea, heartburn, nausea and vomiting.  Genitourinary: Negative for dysuria, hematuria and urgency.  Musculoskeletal: Negative for joint pain and myalgias.  Skin: Negative for itching and rash.  Neurological: Negative for dizziness, tingling, weakness and headaches.  Endo/Heme/Allergies: Negative for environmental allergies. Does not bruise/bleed easily.  Psychiatric/Behavioral: Negative for depression. The patient is not nervous/anxious and does not have insomnia.   All other systems reviewed and are  negative.    Objective:  Physical Exam Vitals reviewed.  Constitutional:      General: He is not in acute distress.    Appearance: He is well-developed.  HENT:     Head: Normocephalic and atraumatic.  Eyes:     General: No scleral icterus.    Conjunctiva/sclera: Conjunctivae normal.     Pupils: Pupils are equal, round, and reactive to light.  Neck:     Vascular: No JVD.     Trachea: No tracheal deviation.  Cardiovascular:     Rate and Rhythm: Normal rate and regular rhythm.     Heart sounds: Normal heart sounds. No murmur heard.   Pulmonary:     Effort: Pulmonary effort is normal. No tachypnea, accessory muscle usage or respiratory distress.     Breath sounds: No stridor. No wheezing, rhonchi or rales.     Comments: Diminished in the LUL  Abdominal:     General: Bowel sounds are normal. There is no distension.     Palpations: Abdomen is soft.     Tenderness: There is no abdominal tenderness.  Musculoskeletal:        General: No tenderness.     Cervical back: Neck supple.  Lymphadenopathy:     Cervical: No cervical adenopathy.  Skin:    General: Skin is warm and dry.     Capillary Refill: Capillary refill takes less than 2 seconds.     Findings: No rash.  Neurological:     Mental Status: He is alert and oriented to person, place, and time.  Psychiatric:        Behavior: Behavior normal.      Vitals:   08/19/20 1005  BP: (!) 142/70  Pulse: 60  SpO2: 96%  Weight: 196 lb (88.9 kg)  Height: $Remove'5\' 10"'kgNjVOU$  (1.778 m)   96% on RA BMI Readings from Last 3 Encounters:  08/19/20 28.12 kg/m  08/12/20 27.56 kg/m  01/23/20 29.13 kg/m   Wt Readings from Last 3 Encounters:  08/19/20 196 lb (88.9 kg)  08/12/20 192 lb 1.6 oz (87.1 kg)  01/23/20 203 lb (92.1 kg)     CBC    Component Value Date/Time   WBC 8.3 08/12/2020 1434   WBC 7.4 04/13/2019 1223   RBC 3.57 (L) 08/12/2020 1434   HGB 9.6 (L) 08/12/2020 1434   HCT 30.5 (L) 08/12/2020 1434   PLT 250 08/12/2020  1434   MCV 85.4 08/12/2020 1434   MCH 26.9 08/12/2020 1434   MCHC 31.5 08/12/2020 1434   RDW 15.7 (H) 08/12/2020 1434   LYMPHSABS 1.2 08/12/2020 1434   MONOABS 0.6 08/12/2020 1434   EOSABS 0.1 08/12/2020 1434   BASOSABS 0.0 08/12/2020 1434     Chest Imaging: CT Chest 07/23/2020: Left upper lobe mass concerning for primary bronchogenic carcinoma. The patient's images have been independently reviewed by me.    Pulmonary Functions Testing Results: No flowsheet data found.  FeNO:   Pathology:  Echocardiogram:   Heart Catheterization:     Assessment & Plan:     ICD-10-CM   1. Lung mass  R91.8   2. Bilateral renal masses  N28.89   3. H/O TIA (transient ischemic attack)  Z86.73   4. Antiplatelet or antithrombotic long-term use  Z79.02     Discussion:  87 year old gentleman with large left upper lobe mass, renal masses concerning either for 2 primaries versus metastatic disease.  CT imaging reviewed today in the office.  Patient has already established care with medical oncology.  Radiation oncology has also been referred.  A PET scan has been ordered.  Plan: Today in the office we discussed risk benefits and alternatives of proceeding with bronchoscopy. We will plan for video bronchoscopy with transbronchial biopsies to the upper lobe mass.  As well as ultrasound with staging to the left hilum.  Patient is on antiplatelets which have been held. We discussed the increased risk of bleeding as well as pneumothorax.  The bleeding risk is slightly increased even though he has been off of Plavix. Patient's wife and patient are understanding of the risk.  And willing to proceed. Patient's case is scheduled for tomorrow. Patient is to eat nothing past midnight tonight or drink. We will see him at the hospital tomorrow.  Patient met with [facility], patient care coordinator prior to discharge from the office to discuss appointments and neck steps.   Current Outpatient Medications:  .   atorvastatin (LIPITOR) 40 MG tablet, Take 1 tablet (40 mg total) by mouth daily., Disp: 45 tablet, Rfl: 1 .  clopidogrel (PLAVIX) 75 MG tablet, Take 1 tablet (75 mg total) by mouth daily., Disp: 90 tablet, Rfl: 0 .  dexamethasone (DECADRON) 4 MG tablet, Take 1 tablet (4 mg total) by mouth 4 (four) times daily., Disp: 40 tablet, Rfl: 0 .  hydrochlorothiazide (MICROZIDE) 12.5 MG capsule, Take 12.5 mg by mouth daily., Disp: , Rfl:  .  HYDROcodone-acetaminophen (NORCO/VICODIN) 5-325 MG tablet, Take 1 tablet by mouth daily as needed for moderate pain or severe pain., Disp: , Rfl:  .  lisinopril (PRINIVIL,ZESTRIL) 10 MG tablet, Take 10 mg by mouth daily., Disp: , Rfl: 4  I spent 62 minutes dedicated to the care of this patient on the date of this encounter to include pre-visit review of records, face-to-face time with the patient discussing conditions above, post visit ordering of testing, clinical documentation with the electronic health record, making appropriate referrals as documented, and communicating necessary findings to members of the patients care team.   Garner Nash, [facility] 08/19/2020 10:30 AM

## 2020-08-19 NOTE — Anesthesia Preprocedure Evaluation (Addendum)
Anesthesia Evaluation  Patient identified by MRN, date of birth, ID band Patient awake    Reviewed: Allergy & Precautions, NPO status , Patient's Chart, lab work & pertinent test results, reviewed documented beta blocker date and time   Airway Mallampati: II  TM Distance: >3 FB Neck ROM: Full    Dental  (+) Teeth Intact, Dental Advisory Given, Caps, Missing   Pulmonary pneumonia, resolved,  LUL lung mass   Pulmonary exam normal breath sounds clear to auscultation       Cardiovascular hypertension, Pt. on medications Normal cardiovascular exam Rhythm:Regular Rate:Normal  Echo 04/14/19 1. Left ventricular ejection fraction, by visual estimation, is 60 to 65%. The left ventricle has normal function. There is no left ventricular hypertrophy.  2. Definity contrast agent was given IV to delineate the left ventricular endocardial borders.  3. Left ventricular diastolic parameters are consistent with Grade II diastolic dysfunction (pseudonormalization).  4. Global right ventricle has normal systolic function.The right ventricular size is normal.  5. Left atrial size was mildly dilated.  6. Right atrial size was normal.  7. The mitral valve is normal in structure. No evidence of mitral valve regurgitation.  8. The tricuspid valve is normal in structure.  9. The aortic valve is tricuspid. Aortic valve regurgitation is not visualized. Mild to moderate aortic valve sclerosis/calcification without any evidence of aortic stenosis.  10. The pulmonic valve was not well visualized. Pulmonic valve regurgitation is trivial.  11. TR signal is inadequate for assessing pulmonary artery systolic pressure.   EKG 08/21/19 NSR, PAC's, unchanged from previous tracing   Neuro/Psych Unsteady gait- uses a cane TIACVA, Residual Symptoms negative psych ROS   GI/Hepatic negative GI ROS, Neg liver ROS,   Endo/Other  Hyperlipidemia   Renal/GU negative  Renal ROS  negative genitourinary   Musculoskeletal negative musculoskeletal ROS (+)   Abdominal   Peds  Hematology negative hematology ROS (+) Plavix therapy- last dose 08/15/20   Anesthesia Other Findings   Reproductive/Obstetrics                          Anesthesia Physical Anesthesia Plan  ASA: III  Anesthesia Plan: General   Post-op Pain Management:    Induction: Intravenous  PONV Risk Score and Plan: 3 and Treatment may vary due to age or medical condition and Ondansetron  Airway Management Planned: Oral ETT  Additional Equipment:   Intra-op Plan:   Post-operative Plan: Extubation in OR  Informed Consent: I have reviewed the patients History and Physical, chart, labs and discussed the procedure including the risks, benefits and alternatives for the proposed anesthesia with the patient or authorized representative who has indicated his/her understanding and acceptance.     Dental advisory given  Plan Discussed with: CRNA and Anesthesiologist  Anesthesia Plan Comments:        Anesthesia Quick Evaluation

## 2020-08-20 ENCOUNTER — Encounter (HOSPITAL_COMMUNITY)
Admission: RE | Disposition: A | Discharge status: Home / Self Care | Payer: Self-pay | Source: Home / Self Care | Attending: Pulmonary Disease

## 2020-08-20 ENCOUNTER — Ambulatory Visit (HOSPITAL_COMMUNITY): Payer: Medicare Other

## 2020-08-20 ENCOUNTER — Ambulatory Visit (HOSPITAL_COMMUNITY): Payer: Medicare Other | Admitting: Anesthesiology

## 2020-08-20 ENCOUNTER — Ambulatory Visit (HOSPITAL_COMMUNITY)
Admission: RE | Admit: 2020-08-20 | Discharge: 2020-08-20 | Disposition: A | Discharge status: Home / Self Care | Payer: Medicare Other | Attending: Pulmonary Disease | Admitting: Pulmonary Disease

## 2020-08-20 ENCOUNTER — Encounter (HOSPITAL_COMMUNITY): Payer: Self-pay | Admitting: Pulmonary Disease

## 2020-08-20 DIAGNOSIS — R918 Other nonspecific abnormal finding of lung field: Secondary | ICD-10-CM

## 2020-08-20 DIAGNOSIS — N2889 Other specified disorders of kidney and ureter: Secondary | ICD-10-CM | POA: Insufficient documentation

## 2020-08-20 DIAGNOSIS — Z7952 Long term (current) use of systemic steroids: Secondary | ICD-10-CM | POA: Diagnosis not present

## 2020-08-20 DIAGNOSIS — Z419 Encounter for procedure for purposes other than remedying health state, unspecified: Secondary | ICD-10-CM

## 2020-08-20 DIAGNOSIS — Z8051 Family history of malignant neoplasm of kidney: Secondary | ICD-10-CM | POA: Insufficient documentation

## 2020-08-20 DIAGNOSIS — C3412 Malignant neoplasm of upper lobe, left bronchus or lung: Secondary | ICD-10-CM | POA: Diagnosis not present

## 2020-08-20 DIAGNOSIS — Z79899 Other long term (current) drug therapy: Secondary | ICD-10-CM | POA: Insufficient documentation

## 2020-08-20 DIAGNOSIS — I10 Essential (primary) hypertension: Secondary | ICD-10-CM | POA: Insufficient documentation

## 2020-08-20 DIAGNOSIS — Z9889 Other specified postprocedural states: Secondary | ICD-10-CM

## 2020-08-20 DIAGNOSIS — Z8042 Family history of malignant neoplasm of prostate: Secondary | ICD-10-CM | POA: Diagnosis not present

## 2020-08-20 DIAGNOSIS — Z8673 Personal history of transient ischemic attack (TIA), and cerebral infarction without residual deficits: Secondary | ICD-10-CM | POA: Insufficient documentation

## 2020-08-20 DIAGNOSIS — Z7902 Long term (current) use of antithrombotics/antiplatelets: Secondary | ICD-10-CM | POA: Insufficient documentation

## 2020-08-20 HISTORY — PX: VIDEO BRONCHOSCOPY WITH ENDOBRONCHIAL NAVIGATION: SHX6175

## 2020-08-20 HISTORY — PX: BRONCHIAL WASHINGS: SHX5105

## 2020-08-20 HISTORY — PX: VIDEO BRONCHOSCOPY WITH ENDOBRONCHIAL ULTRASOUND: SHX6177

## 2020-08-20 HISTORY — PX: HEMOSTASIS CONTROL: SHX6838

## 2020-08-20 HISTORY — DX: Malignant (primary) neoplasm, unspecified: C80.1

## 2020-08-20 HISTORY — DX: Pneumonia, unspecified organism: J18.9

## 2020-08-20 HISTORY — DX: Cerebral infarction, unspecified: I63.9

## 2020-08-20 HISTORY — PX: BRONCHIAL BRUSHINGS: SHX5108

## 2020-08-20 HISTORY — PX: BRONCHIAL NEEDLE ASPIRATION BIOPSY: SHX5106

## 2020-08-20 HISTORY — PX: BRONCHIAL BIOPSY: SHX5109

## 2020-08-20 LAB — BASIC METABOLIC PANEL
Anion gap: 11 (ref 5–15)
BUN: 31 mg/dL — ABNORMAL HIGH (ref 8–23)
CO2: 25 mmol/L (ref 22–32)
Calcium: 8.6 mg/dL — ABNORMAL LOW (ref 8.9–10.3)
Chloride: 102 mmol/L (ref 98–111)
Creatinine, Ser: 1.29 mg/dL — ABNORMAL HIGH (ref 0.61–1.24)
GFR, Estimated: 54 mL/min — ABNORMAL LOW (ref >60)
Glucose, Bld: 119 mg/dL — ABNORMAL HIGH (ref 70–99)
Potassium: 4 mmol/L (ref 3.5–5.1)
Sodium: 138 mmol/L (ref 135–145)

## 2020-08-20 IMAGING — DX DG CHEST 1V PORT
1 series · 1 of 1 positions shown · non-contrast
Comparison: Chest CT [DATE] and earlier.

CLINICAL DATA: 89-year-old male status post bronchoscopy of left
lung mass.

EXAM:
PORTABLE CHEST 1 VIEW

[chest ap]
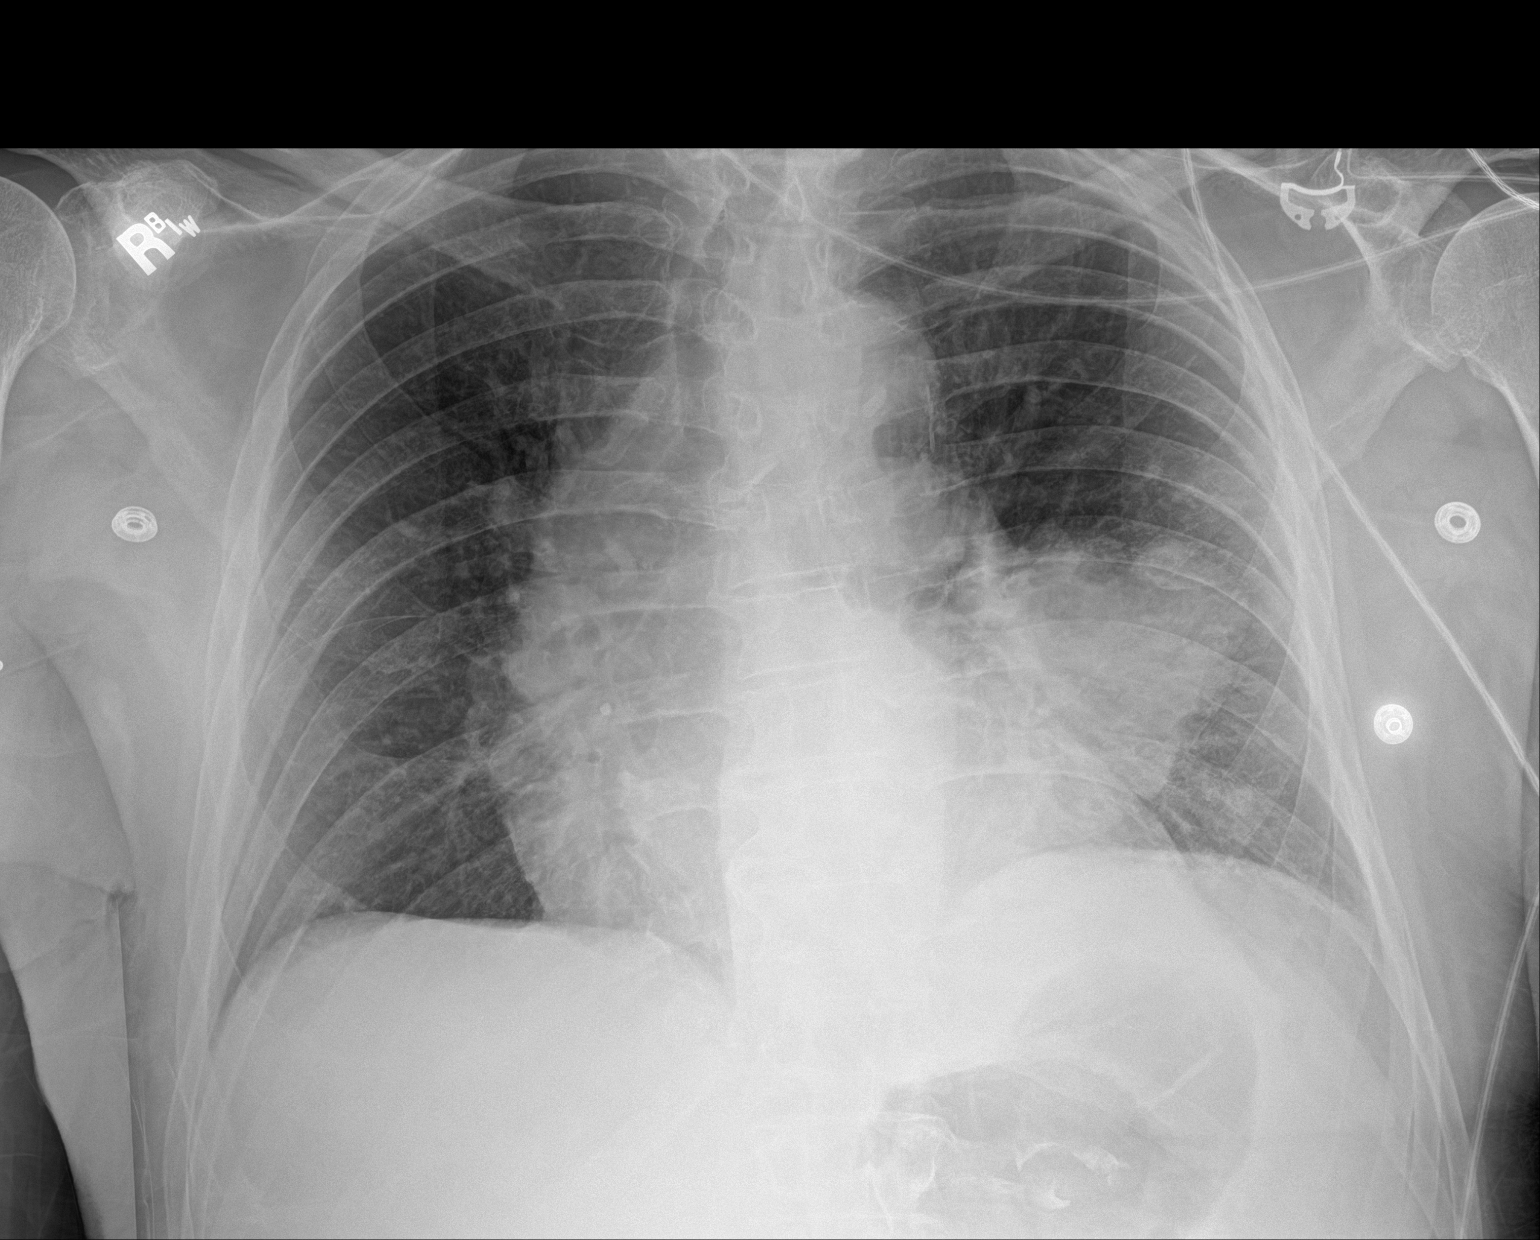

[1 of 1 positions shown; findings below may reference images not displayed]

FINDINGS: Portable AP semi upright view at [08] hours. Mildly lower lung
volumes with stable mediastinal contour, and contour of the
lobulated left lung mass bordering the mediastinum. No pneumothorax.
No pleural effusion or new pulmonary opacity identified.

Increased gastric air, within normal limits. Stable visualized
osseous structures.
IMPRESSION: Stable chest, no adverse features status post bronchoscopic
evaluation of left lung mass.

## 2020-08-20 SURGERY — VIDEO BRONCHOSCOPY WITH ENDOBRONCHIAL NAVIGATION
Anesthesia: General | Laterality: Left

## 2020-08-20 MED ORDER — LACTATED RINGERS IV SOLN: INTRAVENOUS | Status: DC | PRN

## 2020-08-20 MED ORDER — LIDOCAINE 2% (20 MG/ML) 5 ML SYRINGE
INTRAMUSCULAR | Status: DC | PRN
  Administered 2020-08-20: 80 mg via INTRAVENOUS

## 2020-08-20 MED ORDER — LACTATED RINGERS IV SOLN: INTRAVENOUS | Status: DC

## 2020-08-20 MED ORDER — CHLORHEXIDINE GLUCONATE 0.12 % MT SOLN
OROMUCOSAL | Status: AC
  Filled 2020-08-20: qty 15

## 2020-08-20 MED ORDER — ONDANSETRON HCL 4 MG/2ML IJ SOLN
INTRAMUSCULAR | Status: DC | PRN
  Administered 2020-08-20: 4 mg via INTRAVENOUS

## 2020-08-20 MED ORDER — EPINEPHRINE 1 MG/10ML IJ SOSY
PREFILLED_SYRINGE | INTRAMUSCULAR | Status: AC
  Filled 2020-08-20: qty 10

## 2020-08-20 MED ORDER — ROCURONIUM BROMIDE 10 MG/ML (PF) SYRINGE
PREFILLED_SYRINGE | INTRAVENOUS | Status: DC | PRN
  Administered 2020-08-20: 70 mg via INTRAVENOUS

## 2020-08-20 MED ORDER — PROPOFOL 10 MG/ML IV BOLUS
INTRAVENOUS | Status: DC | PRN
  Administered 2020-08-20: 60 mg via INTRAVENOUS

## 2020-08-20 MED ORDER — SUGAMMADEX SODIUM 200 MG/2ML IV SOLN
INTRAVENOUS | Status: DC | PRN
  Administered 2020-08-20: 200 mg via INTRAVENOUS

## 2020-08-20 MED ORDER — DEXAMETHASONE SODIUM PHOSPHATE 10 MG/ML IJ SOLN
INTRAMUSCULAR | Status: DC | PRN
  Administered 2020-08-20: 5 mg via INTRAVENOUS

## 2020-08-20 MED ORDER — EPINEPHRINE PF 1 MG/ML IJ SOLN
INTRAMUSCULAR | Status: DC | PRN
  Administered 2020-08-20: 5 mL

## 2020-08-20 SURGICAL SUPPLY — 53 items
ADAPTER BRONCH F/PENTAX (ADAPTER) ×3 IMPLANT
ADAPTER VALVE BIOPSY EBUS (MISCELLANEOUS) IMPLANT
ADPR BSCP EDG PNTX (ADAPTER) ×2
ADPTR VALVE BIOPSY EBUS (MISCELLANEOUS)
BRUSH CYTOL CELLEBRITY 1.5X140 (MISCELLANEOUS) ×3 IMPLANT
BRUSH SUPERTRAX BIOPSY (INSTRUMENTS) IMPLANT
BRUSH SUPERTRAX NDL-TIP CYTO (INSTRUMENTS) ×3 IMPLANT
CANISTER SUCT 3000ML PPV (MISCELLANEOUS) ×3 IMPLANT
CHANNEL WORK EXTEND EDGE 180 (KITS) IMPLANT
CHANNEL WORK EXTEND EDGE 45 (KITS) IMPLANT
CHANNEL WORK EXTEND EDGE 90 (KITS) IMPLANT
CONT SPEC 4OZ CLIKSEAL STRL BL (MISCELLANEOUS) ×3 IMPLANT
COVER BACK TABLE 60X90IN (DRAPES) ×3 IMPLANT
COVER DOME SNAP 22 D (MISCELLANEOUS) ×3 IMPLANT
FILTER STRAW FLUID ASPIR (MISCELLANEOUS) IMPLANT
FORCEPS BIOP RJ4 1.8 (CUTTING FORCEPS) IMPLANT
FORCEPS BIOP SUPERTRX PREMAR (INSTRUMENTS) ×3 IMPLANT
GAUZE SPONGE 4X4 12PLY STRL (GAUZE/BANDAGES/DRESSINGS) ×3 IMPLANT
GLOVE BIO SURGEON STRL SZ7.5 (GLOVE) ×3 IMPLANT
GLOVE SURG SS PI 7.5 STRL IVOR (GLOVE) ×6 IMPLANT
GOWN STRL REUS W/ TWL LRG LVL3 (GOWN DISPOSABLE) ×4 IMPLANT
GOWN STRL REUS W/TWL LRG LVL3 (GOWN DISPOSABLE) ×6
KIT CLEAN ENDO COMPLIANCE (KITS) ×6 IMPLANT
KIT LOCATABLE GUIDE (CANNULA) IMPLANT
KIT MARKER FIDUCIAL DELIVERY (KITS) IMPLANT
KIT PROCEDURE EDGE 180 (KITS) IMPLANT
KIT PROCEDURE EDGE 45 (KITS) IMPLANT
KIT PROCEDURE EDGE 90 (KITS) IMPLANT
KIT TURNOVER KIT B (KITS) ×3 IMPLANT
MARKER SKIN DUAL TIP RULER LAB (MISCELLANEOUS) ×3 IMPLANT
NDL EBUS SONO TIP PENTAX (NEEDLE) ×2 IMPLANT
NDL SUPERTRX PREMARK BIOPSY (NEEDLE) ×2 IMPLANT
NEEDLE EBUS SONO TIP PENTAX (NEEDLE) ×3 IMPLANT
NEEDLE SUPERTRX PREMARK BIOPSY (NEEDLE) ×3 IMPLANT
NS IRRIG 1000ML POUR BTL (IV SOLUTION) ×3 IMPLANT
OIL SILICONE PENTAX (PARTS (SERVICE/REPAIRS)) ×3 IMPLANT
PAD ARMBOARD 7.5X6 YLW CONV (MISCELLANEOUS) ×6 IMPLANT
PATCHES PATIENT (LABEL) ×9 IMPLANT
SOL ANTI FOG 6CC (MISCELLANEOUS) ×2 IMPLANT
SOLUTION ANTI FOG 6CC (MISCELLANEOUS) ×1
SYR 20CC LL (SYRINGE) ×6 IMPLANT
SYR 20ML ECCENTRIC (SYRINGE) ×6 IMPLANT
SYR 50ML SLIP (SYRINGE) ×3 IMPLANT
SYR 5ML LUER SLIP (SYRINGE) ×3 IMPLANT
TOWEL OR 17X24 6PK STRL BLUE (TOWEL DISPOSABLE) ×3 IMPLANT
TRAP SPECIMEN MUCOUS 40CC (MISCELLANEOUS) IMPLANT
TUBE CONNECTING 20X1/4 (TUBING) ×6 IMPLANT
UNDERPAD 30X30 (UNDERPADS AND DIAPERS) ×3 IMPLANT
VALVE BIOPSY  SINGLE USE (MISCELLANEOUS) ×3
VALVE BIOPSY SINGLE USE (MISCELLANEOUS) ×2 IMPLANT
VALVE DISPOSABLE (MISCELLANEOUS) ×3 IMPLANT
VALVE SUCTION BRONCHIO DISP (MISCELLANEOUS) ×3 IMPLANT
WATER STERILE IRR 1000ML POUR (IV SOLUTION) ×3 IMPLANT

## 2020-08-20 NOTE — Anesthesia Postprocedure Evaluation (Signed)
Anesthesia Post Note  Patient: DEONDRAE MCGRAIL  Procedure(s) Performed: VIDEO BRONCHOSCOPY WITH ENDOBRONCHIAL NAVIGATION (Left ) VIDEO BRONCHOSCOPY WITH ENDOBRONCHIAL ULTRASOUND (Left ) BRONCHIAL BRUSHINGS BRONCHIAL NEEDLE ASPIRATION BIOPSIES BRONCHIAL BIOPSIES HEMOSTASIS CONTROL BRONCHIAL WASHINGS     Patient location during evaluation: PACU Anesthesia Type: General Level of consciousness: awake and alert and oriented Pain management: pain level controlled Vital Signs Assessment: post-procedure vital signs reviewed and stable Respiratory status: spontaneous breathing, nonlabored ventilation and respiratory function stable Cardiovascular status: blood pressure returned to baseline and stable Postop Assessment: no apparent nausea or vomiting Anesthetic complications: no   No complications documented.  Last Vitals:  Vitals:   08/20/20 0945 08/20/20 0953  BP: (!) 151/81 (!) 155/79  Pulse: 61 (!) 59  Resp: 19 20  Temp:    SpO2: 93% 96%    Last Pain:  Vitals:   08/20/20 0945  TempSrc:   PainSc: 0-No pain                 Azaela Caracci A.

## 2020-08-20 NOTE — Progress Notes (Signed)
Location/Histology of Brain Tumor: Non-small cell lung cancer LUL with mets to brain  P  PET 08/22/2020:  Bronchoscopy 08/20/2020  MRI Brain 08/16/2020: Cerebral metastatic disease, most notably a 4.2 cm hemorrhagic metastasis in the superior and posterior right temporal lobe with extensive vasogenic edema.  Second metastatic lesion measuring 7 mm at the right vertex.  CT Chest 07/23/2020: Lobulated soft tissue mass in the left upper lobe most concerning for malignancy. Mildly enlarged hilar lymph node.  Bilateral renal solid masses most concerning for malignancy.  A 1 cm hypodense lesion in the distal body of the pancreas, indeterminate.  Past or anticipated interventions, if any, per neurosurgery:   Past or anticipated interventions, if any, per medical oncology: Dr. Inda Merlin Cassie 08/26/2020  Dose of Decadron, if applicable: 4 mg QID  Recent neurologic symptoms, if any:   Seizures: No  Headaches: No  Nausea: No  Dizziness/ataxia: No  Difficulty with hand coordination: No  Focal numbness/weakness: No, just started using a cane.  Visual deficits/changes: No  Confusion/Memory deficits: No  Breathing: No SOB per patient.  Hemoptysis: non productive cough, no hemoptysis noted.   SAFETY ISSUES:  Prior radiation? No  Pacemaker/ICD? No  Possible current pregnancy? n/a  Is the patient on methotrexate? No  Additional Complaints / other details:

## 2020-08-20 NOTE — Anesthesia Procedure Notes (Signed)
Procedure Name: Intubation Date/Time: 08/20/2020 8:14 AM Performed by: Georgia Duff, CRNA Pre-anesthesia Checklist: Patient identified, Emergency Drugs available, Suction available and Patient being monitored Patient Re-evaluated:Patient Re-evaluated prior to induction Oxygen Delivery Method: Circle System Utilized Preoxygenation: Pre-oxygenation with 100% oxygen Induction Type: IV induction Ventilation: Mask ventilation without difficulty Laryngoscope Size: Miller and 3 Grade View: Grade I Tube type: Oral Tube size: 8.5 mm Number of attempts: 1 Airway Equipment and Method: Stylet and Oral airway Placement Confirmation: ETT inserted through vocal cords under direct vision,  positive ETCO2 and breath sounds checked- equal and bilateral Secured at: 24 cm Tube secured with: Tape Dental Injury: Teeth and Oropharynx as per pre-operative assessment

## 2020-08-20 NOTE — Discharge Instructions (Signed)
Flexible Bronchoscopy, Care After This sheet gives you information about how to care for yourself after your test. Your doctor may also give you more specific instructions. If you have problems or questions, contact your doctor. Follow these instructions at home: Eating and drinking  Do not eat or drink anything (not even water) for 2 hours after your test, or until your numbing medicine (local anesthetic) wears off.  When your numbness is gone and your cough and gag reflexes have come back, you may: ? Eat only soft foods. ? Slowly drink liquids.  The day after the test, go back to your normal diet. Driving  Do not drive for 24 hours if you were given a medicine to help you relax (sedative).  Do not drive or use heavy mac/hinery while taking prescription pain medicine. General instructions   Take over-the-counter and prescription medicines only as told by your doctor.  Return to your normal activities as told. Ask what activities are safe for you.  Do not use any products that have nicotine or tobacco in them. This includes cigarettes and e-cigarettes. If you need help quitting, ask your doctor.  Keep all follow-up visits as told by your doctor. This is important. It is very important if you had a tissue sample (biopsy) taken.  PLEASE RESTART PLAVIX ON 08/22/2020  Get help right away if:  You have shortness of breath that gets worse.  You get light-headed.  You feel like you are going to pass out (faint).  You have chest pain.  You cough up: ? More than a little blood. ? More blood than before. Summary  Do not eat or drink anything (not even water) for 2 hours after your test, or until your numbing medicine wears off.  Do not use cigarettes. Do not use e-cigarettes.  Get help right away if you have chest pain.   This information is not intended to replace advice given to you by your health care provider. Make sure you discuss any questions you have with your health  care provider. Document Released: 01/18/2009 Document Revised: 03/05/2017 Document Reviewed: 04/10/2016 Elsevier Patient Education  2020 [employer].

## 2020-08-20 NOTE — Transfer of Care (Signed)
Immediate Anesthesia Transfer of Care Note  Patient: Gabriel [name]  Procedure(s) Performed: VIDEO BRONCHOSCOPY WITH ENDOBRONCHIAL NAVIGATION (Left ) VIDEO BRONCHOSCOPY WITH ENDOBRONCHIAL ULTRASOUND (Left ) BRONCHIAL BRUSHINGS BRONCHIAL NEEDLE ASPIRATION BIOPSIES BRONCHIAL BIOPSIES HEMOSTASIS CONTROL BRONCHIAL WASHINGS  Patient Location: PACU  Anesthesia Type:General  Level of Consciousness: drowsy and patient cooperative  Airway & Oxygen Therapy: Patient Spontanous Breathing and Patient connected to nasal cannula oxygen  Post-op Assessment: Report given to RN and Post -op Vital signs reviewed and stable  Post vital signs: Reviewed and stable  Last Vitals:  Vitals Value Taken Time  BP 154/82 08/20/20 0915  Temp    Pulse 74 08/20/20 0918  Resp 19 08/20/20 0918  SpO2 96 % 08/20/20 0918  Vitals shown include unvalidated device data.  Last Pain:  Vitals:   08/20/20 0618  TempSrc:   PainSc: 0-No pain         Complications: No complications documented.

## 2020-08-20 NOTE — Interval H&P Note (Signed)
History and Physical Interval Note:  08/20/2020 7:30 AM  Gabriel [name]  has presented today for surgery, with the diagnosis of lung mass.  The various methods of treatment have been discussed with the patient and family. After consideration of risks, benefits and other options for treatment, the patient has consented to  Procedure(s): VIDEO BRONCHOSCOPY WITH ENDOBRONCHIAL NAVIGATION (Left) VIDEO BRONCHOSCOPY WITH ENDOBRONCHIAL ULTRASOUND (Left) as a surgical intervention.  The patient's history has been reviewed, patient examined, no change in status, stable for surgery.  I have reviewed the patient's chart and labs.  Questions were answered to the patient's satisfaction.     [place]ng

## 2020-08-20 NOTE — Op Note (Addendum)
Video Bronchoscopy with Electromagnetic Navigation Procedure Note  Date of Operation: 08/20/2020  Pre-op Diagnosis: Lung mass, left upper lobe  Post-op Diagnosis: Lung mass, left upper lobe  Surgeon: Garner Nash, DO   Assistants: None   Anesthesia: General endotracheal anesthesia  Operation: Flexible video fiberoptic bronchoscopy with electromagnetic navigation and biopsies.  Estimated Blood Loss: Minimal  Complications: None   Indications and History: Gabriel [name] is a 87 y.o. male with left upper lobe lung mass.  The risks, benefits, complications, treatment options and expected outcomes were discussed with the patient.  The possibilities of pneumothorax, pneumonia, reaction to medication, pulmonary aspiration, perforation of a viscus, bleeding, failure to diagnose a condition and creating a complication requiring transfusion or operation were discussed with the patient who freely signed the consent.    Description of Procedure: The patient was seen in the Preoperative Area, was examined and was deemed appropriate to proceed.  The patient was taken to [facility] endoscopy room 2, identified as Toya Smothers and the procedure verified as Flexible Video Fiberoptic Bronchoscopy.  A Time Out was held and the above information confirmed.   Prior to the date of the procedure a high-resolution CT scan of the chest was performed. Utilizing [employer] a virtual tracheobronchial tree was generated to allow the creation of distinct navigation pathways to the patient's parenchymal abnormalities. After being taken to the operating room general anesthesia was initiated and the patient  was orally intubated. The video fiberoptic bronchoscope was introduced via the endotracheal tube and a general inspection was performed which showed normal right and left lung anatomy. The extendable working channel and locator guide were introduced into the bronchoscope. The distinct navigation pathways  prepared prior to this procedure were then utilized to navigate to within 0.8 cm of patient's lesion(s) identified on CT scan. The extendable working channel was secured into place and the locator guide was withdrawn. Under fluoroscopic guidance transbronchial needle brushings, transbronchial Wang needle biopsies, and transbronchial forceps biopsies were performed to be sent for cytology and pathology. A bronchioalveolar lavage was performed in the left upper lobe and sent for cytology.   Patient did have some bleeding from the left upper lobe lingular segment that we were working out of.  We ended up using a few aliquots of 1: 10,000 dilution of epinephrine.  Approximately 5 cc of the solution was instilled into this lingular subsegment to help control bleeding.  I then switched out the standard therapeutic bronchoscope for the endobronchial ultrasound.  Visualize the left hilum there was a small node present.  And subcarinal fatty tissue present but no clear nodal structure.  Due to the ongoing oozing and bleeding from the lingular segment decision was made to withhold sampling of the node within the left hilum.  It is small in size at approximately 8 mm in its largest cross-section that I could see under ultrasound.  We then used a standard therapeutic bronchoscope for airway inspection, aspiration of the bilateral mainstem's was necessary for removal of remaining blood clots and secretions.  At the termination of the procedure all distal subsegments were patent.  There was a well-formed clot in the subsegment of the lingula.  No evidence of active bleeding from this space. At the end of the procedure a general airway inspection was performed and there was no evidence of active bleeding. The bronchoscope was removed.  The patient tolerated the procedure well. There was no significant blood loss and there were no obvious complications. A post-procedural chest  x-ray is pending.  Samples: 1. Transbronchial  needle brushings from left upper lobe 2. Transbronchial Wang needle biopsies from left upper lobe 3. Transbronchial forceps biopsies from left upper lobe 4. Bronchoalveolar lavage from left upper lobe  Plans:  The patient will be discharged from the PACU to home when recovered from anesthesia and after chest x-ray is reviewed. We will review the cytology, pathology and microbiology results with the patient when they become available. Outpatient followup will be with Garner Nash, DO and Dr. Julien Nordmann.   Garner Nash, DO [facility] 08/20/2020 9:09 AM

## 2020-08-21 ENCOUNTER — Ambulatory Visit
Admission: RE | Admit: 2020-08-21 | Discharge: 2020-08-21 | Disposition: A | Discharge status: Home / Self Care | Payer: Medicare Other | Source: Ambulatory Visit | Attending: Radiation Oncology | Admitting: Radiation Oncology

## 2020-08-21 ENCOUNTER — Other Ambulatory Visit: Payer: Self-pay

## 2020-08-21 ENCOUNTER — Encounter: Payer: Self-pay | Admitting: Radiation Oncology

## 2020-08-21 ENCOUNTER — Other Ambulatory Visit: Payer: Self-pay | Admitting: Radiation Therapy

## 2020-08-21 DIAGNOSIS — C7931 Secondary malignant neoplasm of brain: Secondary | ICD-10-CM

## 2020-08-21 DIAGNOSIS — I10 Essential (primary) hypertension: Secondary | ICD-10-CM | POA: Insufficient documentation

## 2020-08-21 DIAGNOSIS — C3412 Malignant neoplasm of upper lobe, left bronchus or lung: Secondary | ICD-10-CM | POA: Diagnosis not present

## 2020-08-21 DIAGNOSIS — Z8673 Personal history of transient ischemic attack (TIA), and cerebral infarction without residual deficits: Secondary | ICD-10-CM | POA: Insufficient documentation

## 2020-08-21 DIAGNOSIS — Z8781 Personal history of (healed) traumatic fracture: Secondary | ICD-10-CM | POA: Insufficient documentation

## 2020-08-21 DIAGNOSIS — Z79899 Other long term (current) drug therapy: Secondary | ICD-10-CM | POA: Insufficient documentation

## 2020-08-21 DIAGNOSIS — F40240 Claustrophobia: Secondary | ICD-10-CM | POA: Diagnosis not present

## 2020-08-21 DIAGNOSIS — I517 Cardiomegaly: Secondary | ICD-10-CM | POA: Diagnosis not present

## 2020-08-21 DIAGNOSIS — E7800 Pure hypercholesterolemia, unspecified: Secondary | ICD-10-CM | POA: Diagnosis not present

## 2020-08-21 DIAGNOSIS — J984 Other disorders of lung: Secondary | ICD-10-CM | POA: Insufficient documentation

## 2020-08-21 DIAGNOSIS — Z85820 Personal history of malignant melanoma of skin: Secondary | ICD-10-CM | POA: Diagnosis not present

## 2020-08-21 DIAGNOSIS — Z8042 Family history of malignant neoplasm of prostate: Secondary | ICD-10-CM | POA: Insufficient documentation

## 2020-08-21 DIAGNOSIS — R590 Localized enlarged lymph nodes: Secondary | ICD-10-CM | POA: Diagnosis not present

## 2020-08-21 DIAGNOSIS — C7949 Secondary malignant neoplasm of other parts of nervous system: Secondary | ICD-10-CM

## 2020-08-21 NOTE — Progress Notes (Addendum)
Radiation Oncology         [phone] ________________________________  Name: Gabriel [name]        MRN: [id number]  Date of Service: 08/21/2020 DOB: February 28, 1931  NL:[id number], [name], MD  [name], MD     REFERRING PHYSICIAN: Dr. Julien Nordmann  DIAGNOSIS: Diagnoses of Brain metastases [facility]) and Malignant neoplasm of upper lobe of left lung [facility]) were pertinent to this visit.   HISTORY OF PRESENT ILLNESS: Gabriel [name] is a 87 y.o. male seen at the request of Dr. Julien Nordmann for presumed stage IV lung cancer with brain metastases.  The patient was evaluated in the emergency department in March 2022 and was seen due to a cough with productive sputum.  No hemoptysis was noted at that time, and imaging revealed an ovoid opacity in the lingula.  It was recommended that he follow-up in the outpatient setting and had a repeat chest x-ray on 06/26/2020 showing concerns for a mass in the left upper lobe which appeared progressive in the interval.  A CT of the chest with contrast on 07/23/2020 showed a lobulated soft tissue mass in the left upper lobe with enlarged left hilar adenopathy, bilateral solid masses were also seen and a 1 cm hypodense lesion in the body of the pancreas as well as atherosclerotic disease were seen.  An MRI of the brain on 08/16/2020 showed a hemorrhagic metastasis in the superior and posterior right temporal lobe with extensive vasogenic edema and a second metastatic lesion measuring 7 mm at the right vertex.  He is scheduled to undergo a PET scan tomorrow.  He did undergo bronchoscopy yesterday with Dr. Valeta Harms. Pathology is pending at this time. He is seen today to discuss radiotherapy to the brain and possible treatment to the chest.     PREVIOUS RADIATION THERAPY: No   PAST MEDICAL HISTORY:  Past Medical History:  Diagnosis Date  . 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 Gait disorder 02/04/2017  . High cholesterol   . Hypertension   . Pneumonia   . Stroke ([place]) 04/2019  last one   gait unsteady- . uses a cane       PAST SURGICAL HISTORY:History reviewed. No pertinent surgical history.   FAMILY HISTORY:  Family History  Problem Relation Age of Onset  . Heart attack Mother   . Heart disease Father   . Cancer - Prostate Father   . Cancer Brother        renal     SOCIAL HISTORY:  reports that he has never smoked. He has never used smokeless tobacco. He reports current alcohol use of about 1.0 standard drink of alcohol per week. He reports that he does not use drugs.  The patient is married and lives in [place].   ALLERGIES: Patient has no known allergies.   MEDICATIONS:  Current Outpatient Medications  Medication Sig Dispense Refill  . atorvastatin (LIPITOR) 40 MG tablet Take 1 tablet (40 mg total) by mouth daily. 45 tablet 1  . clopidogrel (PLAVIX) 75 MG tablet Take 1 tablet (75 mg total) by mouth daily. (Patient not taking: Reported on 08/21/2020) 90 tablet 0  . dexamethasone (DECADRON) 4 MG tablet Take 1 tablet (4 mg total) by mouth 4 (four) times daily. 40 tablet 0  . hydrochlorothiazide (MICROZIDE) 12.5 MG capsule Take 12.5 mg by mouth daily.    Marland Kitchen HYDROcodone-acetaminophen (NORCO/VICODIN) 5-325 MG tablet Take 1 tablet by mouth daily as needed for moderate pain or severe pain.    Marland Kitchen  lisinopril (PRINIVIL,ZESTRIL) 10 MG tablet Take 10 mg by mouth daily.  4   No current facility-administered medications for this encounter.     REVIEW OF SYSTEMS: On review of systems, the patient reports that he is doing well. He only has shortness of breath when walking up a hill in his neighborhood when he and his wife walk in the evenings. He denies shortness of breath otherwise. He had one episode of blood tinged sputum following yesterday's bronchoscopy but no other episodes. He reports he's had hiccups lately without a known reason. He denies headaches, visual, auditory, speech, or movement difficulties. His wife denies any confusion. He denies new joint  aches or pains. No other complaints are verbalized.     PHYSICAL EXAM:  Wt Readings from Last 3 Encounters:  08/21/20 198 lb (89.8 kg)  08/20/20 195 lb (88.5 kg)  08/19/20 196 lb (88.9 kg)   Temp Readings from Last 3 Encounters:  08/21/20 97.6 F (36.4 C)  08/20/20 (!) 97 F (36.1 C)  08/12/20 98.4 F (36.9 C)   BP Readings from Last 3 Encounters:  08/21/20 (!) 108/57  08/20/20 (!) 155/79  08/19/20 (!) 142/70   Pulse Readings from Last 3 Encounters:  08/21/20 71  08/20/20 (!) 59  08/19/20 60   Pain Assessment Pain Score: 0-No pain/10  In general this is a well appearing Caucasian male in no acute distress.  He's alert and oriented x4 and appropriate throughout the examination. Cardiopulmonary assessment is negative for acute distress and he exhibits normal effort.    ECOG = 1  0 - Asymptomatic (Fully active, able to carry on all predisease activities without restriction)  1 - Symptomatic but completely ambulatory (Restricted in physically strenuous activity but ambulatory and able to carry out work of a light or sedentary nature. For example, light housework, office work)  2 - Symptomatic, <50% in bed during the day (Ambulatory and capable of all self care but unable to carry out any work activities. Up and about more than 50% of waking hours)  3 - Symptomatic, >50% in bed, but not bedbound (Capable of only limited self-care, confined to bed or chair 50% or more of waking hours)  4 - Bedbound (Completely disabled. Cannot carry on any self-care. Totally confined to bed or chair)  5 - Death   [name], Creech RH, Tormey DC, et al. [phone]). "Toxicity and response criteria of the [facility]". [place]. 5 (6): 649-55    LABORATORY DATA:  Lab Results  Component Value Date   WBC 8.3 08/12/2020   HGB 9.6 (L) 08/12/2020   HCT 30.5 (L) 08/12/2020   MCV 85.4 08/12/2020   PLT 250 08/12/2020   Lab Results  Component Value Date   NA 138  08/20/2020   K 4.0 08/20/2020   CL 102 08/20/2020   CO2 25 08/20/2020   Lab Results  Component Value Date   ALT 10 08/12/2020   AST 12 (L) 08/12/2020   ALKPHOS 115 08/12/2020   BILITOT 0.4 08/12/2020      RADIOGRAPHY: CT CHEST W CONTRAST  Result Date: 07/23/2020 CLINICAL DATA:  87 year old male with left upper lobe lung cancer. EXAM: CT CHEST WITH CONTRAST TECHNIQUE: Multidetector CT imaging of the chest was performed during intravenous contrast administration. CONTRAST:  61mL ISOVUE-300 IOPAMIDOL (ISOVUE-300) INJECTION 61% COMPARISON:  Chest radiograph dated 06/26/2020. FINDINGS: Cardiovascular: There is mild cardiomegaly. There is 3 vessel coronary vascular calcification. No pericardial effusion. There is moderate atherosclerotic calcification  of the thoracic aorta. No aneurysmal dilatation or dissection. The origins of the great vessels of the aortic arch appear patent as visualized. The central pulmonary arteries are grossly unremarkable for the degree of. Mediastinum/Nodes: There is a mildly enlarged left hilar lymph node measuring 13 x 16 mm with a focus calcification. No mediastinal adenopathy. The esophagus and thyroid grossly. No mediastinal fluid collection. Lungs/Pleura: Lobulated soft tissue mass in the upper lobe anteriorly measures 7.8 x 6.8 cm in greatest axial dimensions and 5.1 cm in craniocaudal length. The mass abuts the left anterior pleural surface and probably extends into the left third intercostal space. The mass also abuts the left mediastinal pleura surface and may extend into the left hilum. A 5 mm calcified nodule in the left upper lobe, likely a granuloma. A 3 mm nodule is noted in the right upper lobe (24/5). There are bibasilar linear atelectasis/scarring. No pleural effusion pneumothorax. The central airways are patent Upper Abdomen: Several subcentimeter splenic hypodense lesions are characterized on this CT. There are partially visualized bilateral renal solid masses  most concerning for malignancy (primary versus metastatic disease). These measure up to 4.3 x 2.9 cm the right. Dedicated renal mass protocol MRI is recommended further evaluation. Bilateral renal cysts noted. There is a 1 cm hypodense lesion in the distal body of the pancreas, indeterminate. Attention on MRI recommended. Musculoskeletal: Degenerative changes of spine. No acute pathology. No suspicious osseous lesions. IMPRESSION: 1. Lobulated soft tissue mass in the left upper lobe most concerning for malignancy. Multidisciplinary consult is advised. 2. Mildly enlarged left hilar lymph node. 3. Bilateral renal solid masses most concerning for malignancy (primary versus metastatic disease). Dedicated renal mass protocol MRI is recommended further evaluation. 4. A 1 cm hypodense lesion in the distal body of the pancreas, indeterminate. Attention on MRI recommended. 5. Aortic Atherosclerosis (ICD10-I70.0). These results will be called to the ordering clinician or representative by the Radiologist Assistant, and communication documented in the PACS or [employer]. Electronically Signed   By: Anner Crete M.D.   On: 07/23/2020 23:26   MR BRAIN W WO CONTRAST  Result Date: 08/16/2020 CLINICAL DATA:  Non-small cell lung cancer stage EXAM: MRI HEAD WITHOUT AND WITH CONTRAST TECHNIQUE: Multiplanar, multiecho pulse sequences of the brain and surrounding structures were obtained without and with intravenous contrast. CONTRAST:  Dose is currently not known, reference EMR COMPARISON:  07/13/2019 FINDINGS: Brain: Heterogeneous mass with areas of T1 hyperintensity and enhancement in the posterior and superior right temporal lobe measuring up to 4.2 cm with moderate to extensive adjacent vasogenic edema. There is local mass effect without midline shift. Second cortically based focus of nodular enhancement measuring 7 mm at the superior right frontal lobe. Notable brain atrophy. No obstructive hydrocephalus or  extra-axial collection. Vascular: Normal flow voids Skull and upper cervical spine: Normal marrow signal Sinuses/Orbits: Negative A is being placed to the ordering provider for patient disposition. IMPRESSION: 1. Cerebral metastatic disease, most notably a 4.2 cm hemorrhagic metastasis in the superior and posterior right temporal lobe with extensive vasogenic edema. 2. Second metastatic lesion measuring 7 mm at the right vertex. Electronically Signed   By: Monte Fantasia M.D.   On: 08/16/2020 07:21   DG CHEST PORT 1 VIEW  Result Date: 08/20/2020 CLINICAL DATA:  87 year old male status post bronchoscopy of left lung mass. EXAM: PORTABLE CHEST 1 VIEW COMPARISON:  Chest CT 07/23/2020 and earlier. FINDINGS: Portable AP semi upright view at 0927 hours. Mildly lower lung volumes with stable mediastinal contour,  and contour of the lobulated left lung mass bordering the mediastinum. No pneumothorax. No pleural effusion or new pulmonary opacity identified. Increased gastric air, within normal limits. Stable visualized osseous structures. IMPRESSION: Stable chest, no adverse features status post bronchoscopic evaluation of left lung mass. Electronically Signed   By: Genevie Ann M.D.   On: 08/20/2020 09:43   DG C-ARM BRONCHOSCOPY  Result Date: 08/20/2020 C-ARM BRONCHOSCOPY: Fluoroscopy was utilized by the requesting physician.  No radiographic interpretation.       IMPRESSION/PLAN: 1. Probable stage IV non-small cell carcinoma of the lung with brain metastases.  Dr. Lisbeth Renshaw discusses the findings from his recent imaging, we will await the final results of his pathology from bronchoscopy, but would anticipate a course of radiotherapy.  If he did have small cell cancer, he would be a better candidate for whole brain radiation however if this is a non-small cell carcinoma, or if it's a renal cell carcinoma, Dr. Lisbeth Renshaw discusses the rationale to consider preoperative stereotactic radiosurgery [facility]) in a single fraction.   Dr. Lisbeth Renshaw also discusses the option of palliative radiotherapy to the chest given the location and size and concern for potential hemoptysis from this. Dr. Lisbeth Renshaw recommends 10 fractions to the chest. We reviewed the risks, benefits, short and long-term effects of both modalities of radiotherapy as well as delivery and logistics of treatment and the need to meet with neurosurgery.  The patient is in agreement with this plan.  We will coordinate with our specialty navigator to coordinate a 3T MRI scan for purposes of planning his treatment.  He is agreement with this plan. Written consent is obtained and placed in the chart, a copy was provided to the patient. He will come back to simulate on Friday for his chest treatment, and further plans for his SRS will follow. He will also follow up with medical oncology next week too. 2. Claustrophobia. The patient is interested in a lower dose of ativan prior to MRI and radiation treatments. This will be called into his pharmacy.   In a visit lasting 120 minutes, greater than 50% of the time was spent face to face discussing the patient's condition, in preparation for the discussion, and coordinating the patient's care.   The above documentation reflects my direct findings during this shared patient visit. Please see the separate note by Dr. Lisbeth Renshaw on this date for the remainder of the patient's plan of care.    Carola Rhine, [facility]   **Disclaimer: This note was dictated with voice recognition software. Similar sounding words can inadvertently be transcribed and this note may contain transcription errors which may not have been corrected upon publication of note.**

## 2020-08-22 ENCOUNTER — Other Ambulatory Visit: Payer: Self-pay | Admitting: Radiation Oncology

## 2020-08-22 ENCOUNTER — Ambulatory Visit (HOSPITAL_COMMUNITY)
Admission: RE | Admit: 2020-08-22 | Discharge: 2020-08-22 | Disposition: A | Discharge status: Home / Self Care | Payer: Medicare Other | Source: Ambulatory Visit | Attending: Internal Medicine | Admitting: Internal Medicine

## 2020-08-22 ENCOUNTER — Ambulatory Visit (HOSPITAL_COMMUNITY): Admission: RE | Admit: 2020-08-22 | Payer: Medicare Other | Source: Ambulatory Visit

## 2020-08-22 ENCOUNTER — Encounter (HOSPITAL_COMMUNITY): Payer: Self-pay | Admitting: Pulmonary Disease

## 2020-08-22 DIAGNOSIS — C3490 Malignant neoplasm of unspecified part of unspecified bronchus or lung: Secondary | ICD-10-CM | POA: Diagnosis not present

## 2020-08-22 LAB — GLUCOSE, CAPILLARY: Glucose-Capillary: 106 mg/dL — ABNORMAL HIGH (ref 70–99)

## 2020-08-22 IMAGING — PT NM PET TUM IMG INITIAL (PI) SKULL BASE T - THIGH
8 series · 25 of 25 positions shown · non-contrast
Comparison: CT [DATE]

CLINICAL DATA: Initial treatment strategy for non-small cell lung
cancer. Brain metastasis.

EXAM:
NUCLEAR MEDICINE PET SKULL BASE TO THIGH
TECHNIQUE: 9.9 mCi F-18 FDG was injected intravenously. Full-ring PET imaging
was performed from the skull base to thigh after the radiotracer. CT
data was obtained and used for attenuation correction and anatomic
localization.
Fasting blood glucose: 106 mg/dl

[Series 3: pet sk_thigh ac · axial · 5.0mm · 4.07mm/px · z∈[-1526,-586]mm · 6 of 236 slices shown]
[im 1/236]
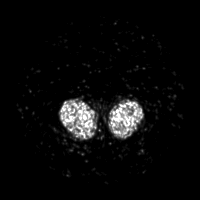
[im 48/236]
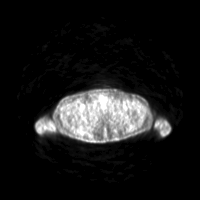
[im 95/236]
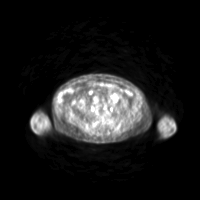
[im 142/236]
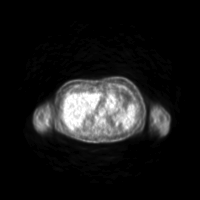
[im 189/236]
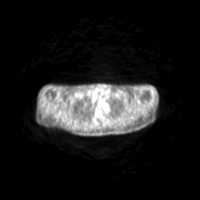
[im 236/236]
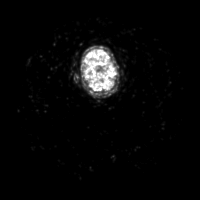

[Series 4: ct sk_thigh 5.0 bf37 · axial · 5.0mm · 0.98mm/px · z∈[-1526,-586]mm · 5 of 236 slices shown]
[im 1/236]
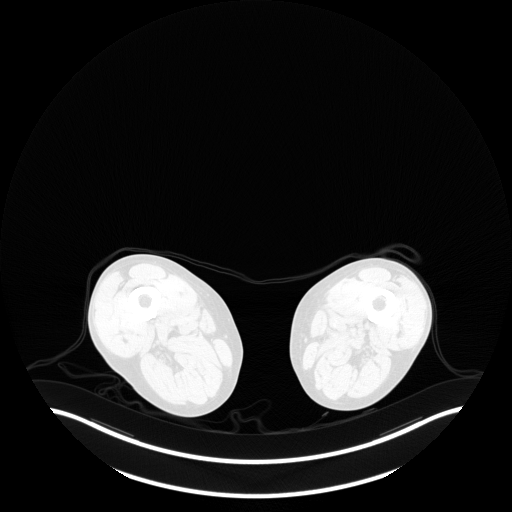
[im 59/236]
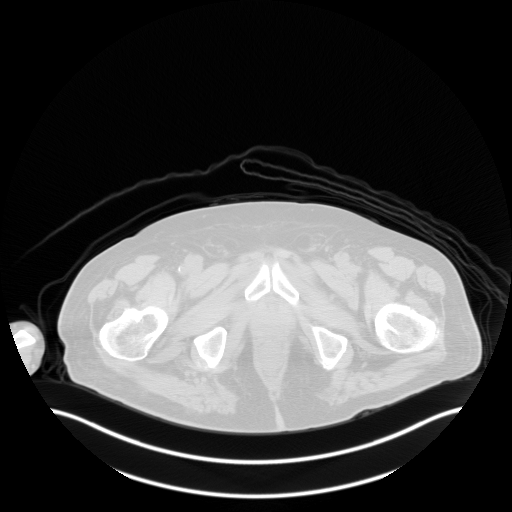
[im 118/236]
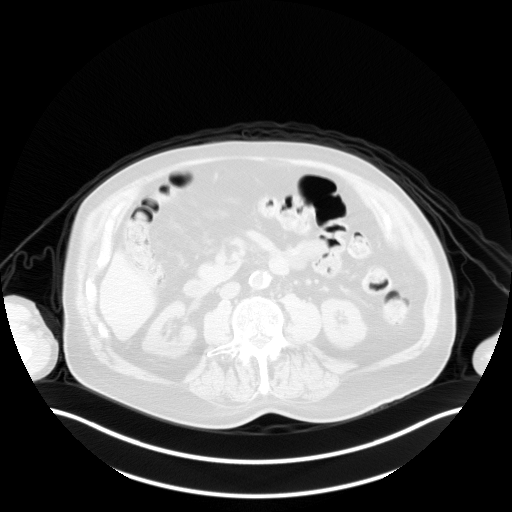
[im 177/236]
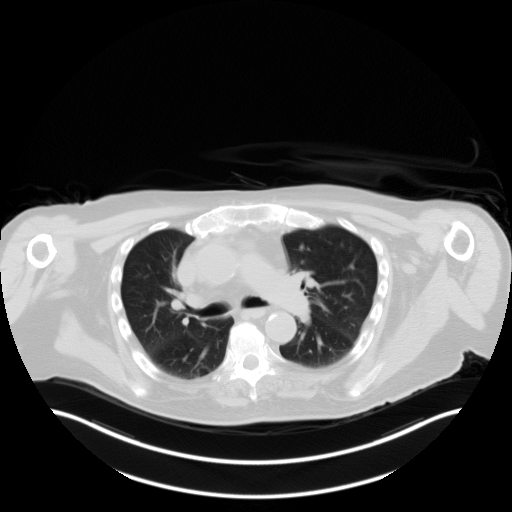
[im 236/236  brain]
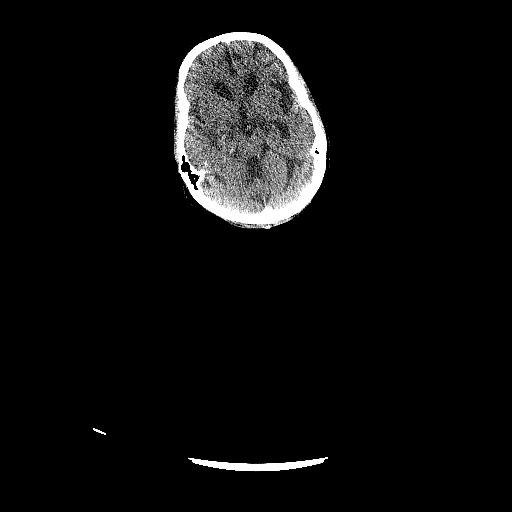

[Series 5: pet sk_thigh nac · axial · 5.0mm · 4.07mm/px · z∈[-1526,-586]mm · 5 of 236 slices shown]
[im 1/236]
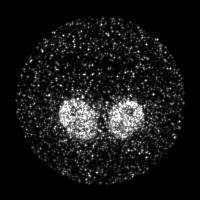
[im 59/236]
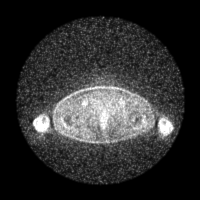
[im 118/236]
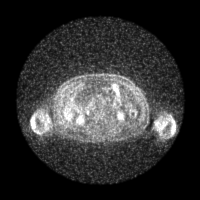
[im 177/236]
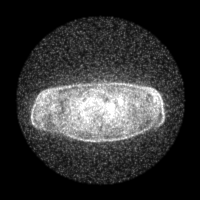
[im 236/236]
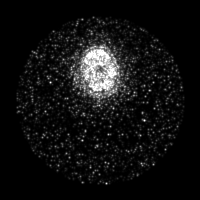

[Series 8: ct sk_thigh 5.0 br59 lung_bone · axial · 5.0mm · 0.66mm/px · 1 of 55 slices shown]
[im 1/55]
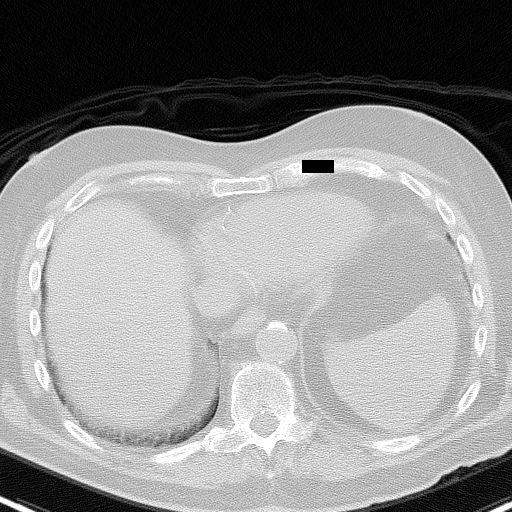

[Series 603: fused cor · 1 of 56 slices shown]
[im 1/56]
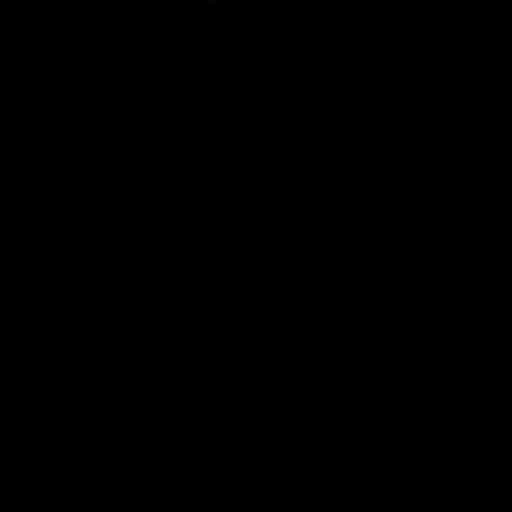

[Series 604: <mip collection> · coronal · 1.95mm/px · 1 of 32 slices shown]
[im 1/32]
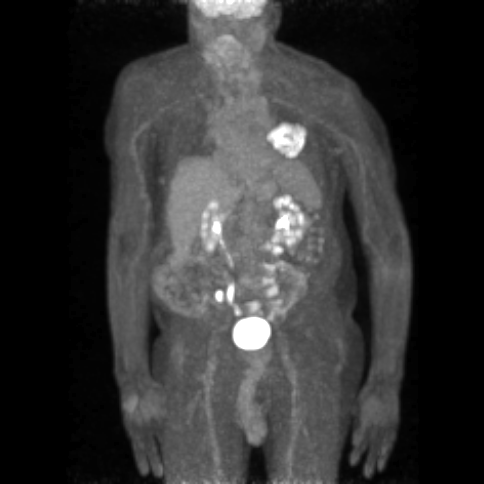

[Series 605: range-ct sk_thigh 5.0 bf37-tra-<alpha range> · 5 of 231 slices shown]
[im 1/231]
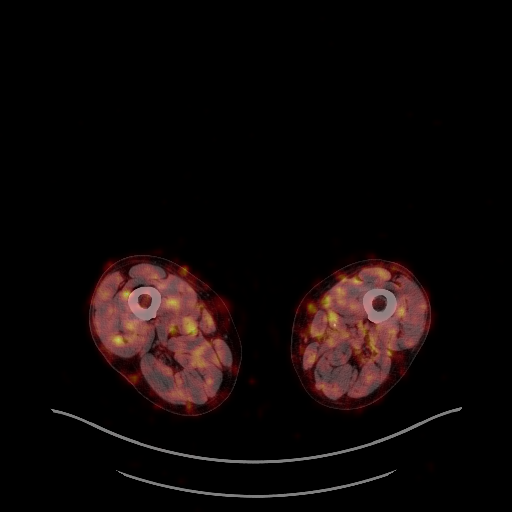
[im 58/231]
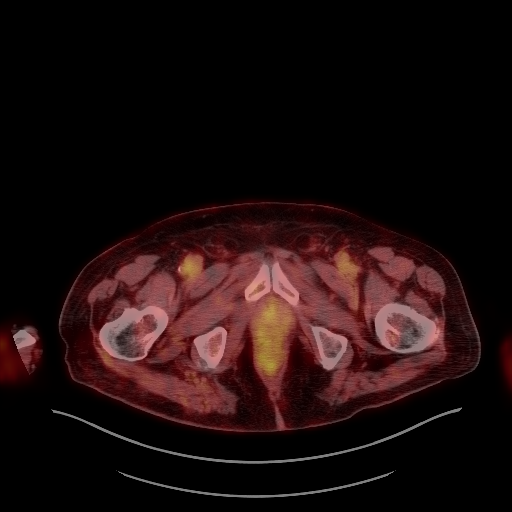
[im 116/231]
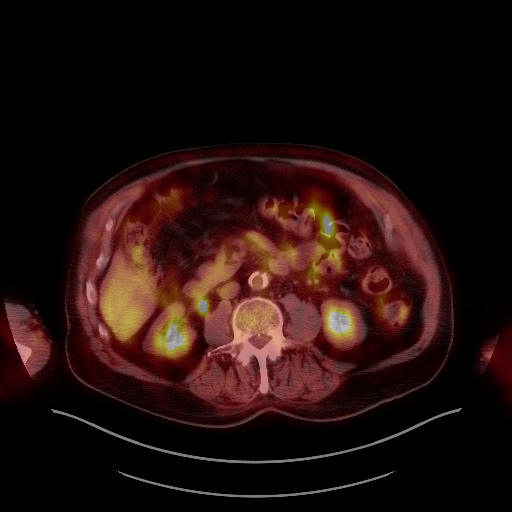
[im 173/231]
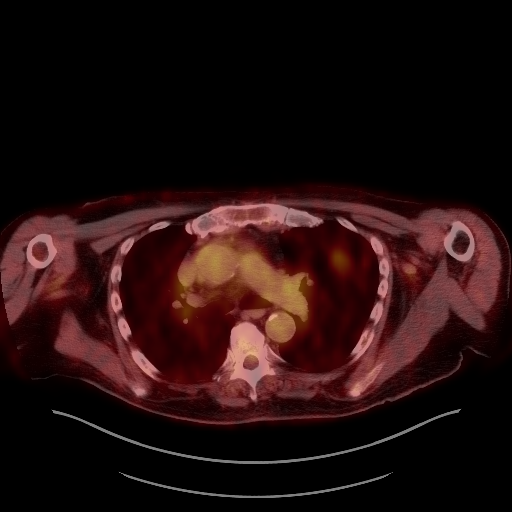
[im 231/231]
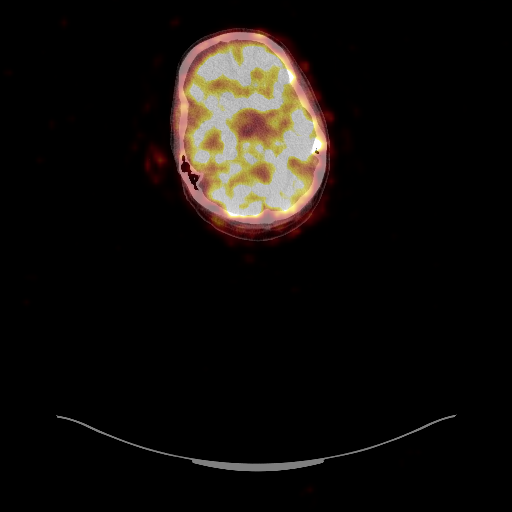

[Series 1090: results mm oncology reading · 1.0mm · 1.00mm/px · 1 of 5 slices shown]
[im 1/5]
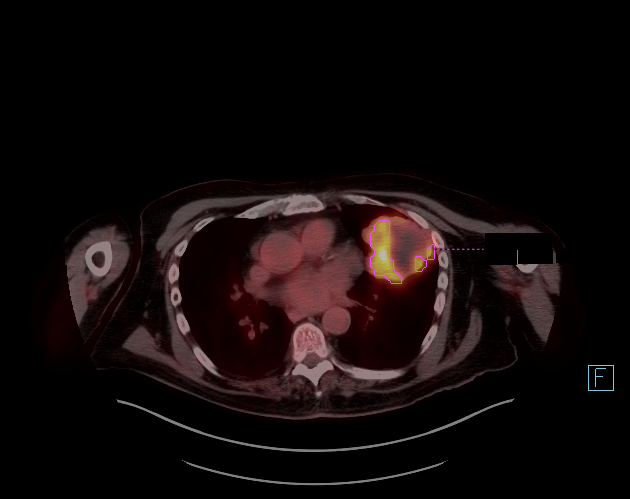

[25 of 25 positions shown; findings below may reference images not displayed]

FINDINGS: Mediastinal blood pool activity: SUV max

Liver activity: SUV max NA

NECK: No hypermetabolic lymph nodes in the neck.

Incidental CT findings: none

CHEST: Lobular mass in the lingula measuring 6.5 x 6.7 cm with
intense hypermetabolic peripheral activity with SUV max equal 17.
Central hypo metabolism consistent with central necrosis.

No hypermetabolic LEFT hilar nodes or mediastinal lymph nodes. No
hypermetabolic supraclavicular nodes.

Incidental CT findings: none

ABDOMEN/PELVIS: No abnormal metabolic activity liver. Adrenal glands
normal.

Solid renal lesion of the LEFT kidney measuring 4.1 cm (image 110)
has mild metabolic activity similar to renal parenchyma (SUV max
equal 3.3). Low-density lesion of the RIGHT kidney medially measures
4.1 cm but also as metabolic activity SUV max equal 3.6. Lesion
demonstrated on comparison CT scans of followed by urology (CT scan
[DATE])

No hypermetabolic abdominopelvic nodal metastasis.

There is hypermetabolic focus adjacent to the cecum and ileocecal
valve without CT correlation with SUV max equal 19.8. Image 144.

Incidental CT findings: none

SKELETON: No focal hypermetabolic activity to suggest skeletal
metastasis.

Incidental CT findings: none
IMPRESSION: 1. Hypermetabolic lingular mass with central necrosis consistent
primary bronchogenic carcinoma.
2. No evidence of mediastinal nodal metastasis.
3. Bilateral renal lesions with mild metabolic activity are
concerning for a solid and cystic renal neoplasms. Metastatic
lesions are not favored given the low metabolic activity. Lesions
followed by urology.
4. Focus of intense metabolic activity near the ileocecal valve.
This is an indeterminate finding. Recommend follow-up on routine
surveillance.

## 2020-08-22 MED ORDER — LORAZEPAM 0.5 MG PO TABS: 0.2500 mg | ORAL_TABLET | ORAL | 0 refills | Status: DC

## 2020-08-22 MED ORDER — FLUDEOXYGLUCOSE F - 18 (FDG) INJECTION
10.0000 | Freq: Once | INTRAVENOUS | Status: AC
  Administered 2020-08-22: 9.91 via INTRAVENOUS

## 2020-08-22 NOTE — Progress Notes (Signed)
[facility] OFFICE PROGRESS NOTE  [name], MD [address], [address] [place]  [address]  DIAGNOSIS:  1) Metastatic Melanoma he was diagnosed in May 2022.  He presented with a left upper lobe mass with possible extension to the chest wall as well as left hilar and mediastinal pleura and metastatic disease to the brain.  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 Next follow up scheduled for August 2022.   PRIOR THERAPY: None  CURRENT THERAPY:  1) SRS to the metastatic brain lesions under the care of Dr. Lisbeth Renshaw.  Expected to start 08/27/20. 2) radiation to the chest/lung mass following SRS to the brain  INTERVAL HISTORY: Gabriel [name] 87 y.o. male returns to the clinic today for a follow up visit accompanied by Gabriel [name]. The patient was recently diagnosed with metastatic cancer. Given the patient's history of a renal neoplasm, it was unclear if this was metastatic renal neoplasm versus primary lung cancer.  However, the patient also has a history of melanoma which was excised from Gabriel abdomen which Gabriel [name] estimates was at least 5 years ago by Dr. Ubaldo Glassing. Therefore, the patient saw Dr. Valeta Harms who performed a video bronchoscopy and transbronchial biopsy of the left upper lobe mass which the pathology was consistent with metastatic melanoma ([id number]).  As part of the staging work-up, the patient also was found to have metastatic disease to the brain for which he is going to undergo SRS under the care of Dr. Lisbeth Renshaw tomorrow. They are going to meet with neurosurgery to determine if he is a candidate for craniotomy and if so would offer preop SRS followed by surgery. If he is not a surgical candidate he would receive 3 fractions of SRS in 3 treatments along with Gabriel planned radiation to the chest.   Overall, the patient is feeling well today.  He denies any fever, chills, night sweats, or weight loss.  He has a mild cough but denies any shortness  of breath, chest pain, or hemoptysis.  He denies any nausea, vomiting, diarrhea, or constipation.  He is here today for evaluation and to review the pathology results and for more detailed discussion about Gabriel current condition and recommended treatment options.   MEDICAL HISTORY: Past Medical History:  Diagnosis Date  . Cancer ([place])    melonoma on stomach  . Gait disorder 02/04/2017  . High cholesterol   . Hypertension   . Pneumonia   . Stroke ([place]) 04/2019 last one   gait unsteady- . uses a cane    ALLERGIES:  has No Known Allergies.  MEDICATIONS:  Current Outpatient Medications  Medication Sig Dispense Refill  . atorvastatin (LIPITOR) 40 MG tablet Take 1 tablet (40 mg total) by mouth daily. 45 tablet 1  . clopidogrel (PLAVIX) 75 MG tablet Take 1 tablet (75 mg total) by mouth daily. 90 tablet 0  . dexamethasone (DECADRON) 4 MG tablet Take 1 tablet (4 mg total) by mouth 4 (four) times daily. 40 tablet 0  . hydrochlorothiazide (MICROZIDE) 12.5 MG capsule Take 12.5 mg by mouth daily.    Marland Kitchen HYDROcodone-acetaminophen (NORCO/VICODIN) 5-325 MG tablet Take 1 tablet by mouth daily as needed for moderate pain or severe pain.    Marland Kitchen lisinopril (PRINIVIL,ZESTRIL) 10 MG tablet Take 10 mg by mouth daily.  4  . LORazepam (ATIVAN) 0.5 MG tablet Take 0.5 tablets (0.25 mg total) by mouth as directed. 1/2 tab po 30 minutes prior to radiation or MRI scans 30  tablet 0   No current facility-administered medications for this visit.    SURGICAL HISTORY:  Past Surgical History:  Procedure Laterality Date  . BRONCHIAL BIOPSY  08/20/2020   Procedure: BRONCHIAL BIOPSIES;  Surgeon: Garner Nash, DO;  Location: [facility];  Service: Pulmonary;;  . BRONCHIAL BRUSHINGS  08/20/2020   Procedure: BRONCHIAL BRUSHINGS;  Surgeon: Garner Nash, DO;  Location: [facility];  Service: Pulmonary;;  . BRONCHIAL NEEDLE ASPIRATION BIOPSY  08/20/2020   Procedure: BRONCHIAL NEEDLE ASPIRATION BIOPSIES;  Surgeon: Garner Nash, DO;  Location: [facility];  Service: Pulmonary;;  . BRONCHIAL WASHINGS  08/20/2020   Procedure: BRONCHIAL WASHINGS;  Surgeon: Garner Nash, DO;  Location: [facility];  Service: Pulmonary;;  . HEMOSTASIS CONTROL  08/20/2020   Procedure: HEMOSTASIS CONTROL;  Surgeon: Garner Nash, DO;  Location: [facility];  Service: Pulmonary;;  . VIDEO BRONCHOSCOPY WITH ENDOBRONCHIAL NAVIGATION Left 08/20/2020   Procedure: VIDEO BRONCHOSCOPY WITH ENDOBRONCHIAL NAVIGATION;  Surgeon: Garner Nash, DO;  Location: [facility];  Service: Pulmonary;  Laterality: Left;  Marland Kitchen VIDEO BRONCHOSCOPY WITH ENDOBRONCHIAL ULTRASOUND Left 08/20/2020   Procedure: VIDEO BRONCHOSCOPY WITH ENDOBRONCHIAL ULTRASOUND;  Surgeon: Garner Nash, DO;  Location: [facility];  Service: Pulmonary;  Laterality: Left;    REVIEW OF SYSTEMS:   Review of Systems  Constitutional: Negative for appetite change, chills, fatigue, fever and unexpected weight change.  HENT: Negative for mouth sores, nosebleeds, sore throat and trouble swallowing.   Eyes: Negative for eye problems and icterus.  Respiratory: Positive for mild cough. Negative for hemoptysis, shortness of breath and wheezing.   Cardiovascular: Negative for chest pain and leg swelling.  Gastrointestinal: Negative for abdominal pain, constipation, diarrhea, nausea and vomiting.  Genitourinary: Negative for bladder incontinence, difficulty urinating, dysuria, frequency and hematuria.   Musculoskeletal: Negative for back pain, gait problem, neck pain and neck stiffness.  Skin: Negative for itching and rash.  Neurological: Negative for dizziness, extremity weakness, gait problem, headaches, light-headedness and seizures.  Hematological: Negative for adenopathy. Does not bruise/bleed easily.  Psychiatric/Behavioral: Negative for confusion, depression and sleep disturbance. The patient is not nervous/anxious.     PHYSICAL EXAMINATION:  Blood pressure 132/77, pulse  67, temperature 97.6 F (36.4 C), temperature source Tympanic, resp. rate 17, height $RemoveBe'5\' 10"'[id number]$  (1.778 m), weight 192 lb 3.2 oz (87.2 kg), SpO2 100 %.  ECOG PERFORMANCE STATUS: 1 - Symptomatic but completely ambulatory  Physical Exam  Constitutional: Oriented to person, place, and time and well-developed, well-nourished, and in no distress.  HENT:  Head: Normocephalic and atraumatic.  Mouth/Throat: Oropharynx is clear and moist. No oropharyngeal exudate.  Eyes: Conjunctivae are normal. Right eye exhibits no discharge. Left eye exhibits no discharge. No scleral icterus.  Neck: Normal range of motion. Neck supple.  Cardiovascular: Normal rate, regular rhythm, normal heart sounds and intact distal pulses.   Pulmonary/Chest: Effort normal and breath sounds normal. No respiratory distress. No wheezes. No rales.  Abdominal: Soft. Bowel sounds are normal. Exhibits no distension and no mass. There is no tenderness.  Musculoskeletal: Normal range of motion. Exhibits no edema.  Lymphadenopathy:    No cervical adenopathy.  Neurological: Alert and oriented to person, place, and time. Exhibits muscle wasting Examine in the wheelchair.  Skin: Skin is warm and dry. No rash noted. Not diaphoretic. No erythema. No pallor.  Psychiatric: Mood, memory and judgment normal.  Vitals reviewed.  LABORATORY DATA: Lab Results  Component Value Date   WBC 8.3 08/12/2020   HGB 9.6 (L) 08/12/2020  HCT 30.5 (L) 08/12/2020   MCV 85.4 08/12/2020   PLT 250 08/12/2020      Chemistry      Component Value Date/Time   NA 138 08/20/2020 0549   K 4.0 08/20/2020 0549   CL 102 08/20/2020 0549   CO2 25 08/20/2020 0549   BUN 31 (H) 08/20/2020 0549   CREATININE 1.29 (H) 08/20/2020 0549   CREATININE 1.31 (H) 08/12/2020 1434      Component Value Date/Time   CALCIUM 8.6 (L) 08/20/2020 0549   ALKPHOS 115 08/12/2020 1434   AST 12 (L) 08/12/2020 1434   ALT 10 08/12/2020 1434   BILITOT 0.4 08/12/2020 1434        RADIOGRAPHIC STUDIES:  MR Brain W Wo Contrast  Result Date: 08/25/2020 CLINICAL DATA:  Non-small cell lung cancer. Treatment planning. EXAM: MRI HEAD WITHOUT AND WITH CONTRAST TECHNIQUE: Multiplanar, multiecho pulse sequences of the brain and surrounding structures were obtained without and with intravenous contrast. CONTRAST:  39mL MULTIHANCE GADOBENATE DIMEGLUMINE 529 MG/ML IV SOLN COMPARISON:  MRI brain without and with contrast at 1.5 tesla 08/16/2020. FINDINGS: Brain: A hemorrhagic mass lesion is again noted in the posterior right temporal lobe. Extensive T1 shortening on the precontrast images is compatible with intrinsic hemorrhage. There is some enhancement most notably along the medial and anterior components of the lesion. The entirety of the lesion measures 4.5 x 3.8 x 3.7 cm, not significantly changed from prior exam. There is local mass effect in surrounding edema. Vasogenic edema extends to the anterior temporal lobe and superiorly within the right parietal lobe. A rim enhancing lesion is present in the posterior high right parietal lobe measuring 7 x 7 x 6 mm. Mild surrounding edema noted. There is no hemorrhage associated with the more superior lesion. A 3 mm nonhemorrhagic enhancing lesion is noted in the anterior inferior left frontal lobe on image 90 of series 11. No additional enhancing lesions are present. The posterior right temporal lobe lesion creates mass effect with effacement the sulci and partial effacement of the right lateral ventricle. No midline shift is present. Diffuse scattered subcortical and periventricular T2 hyperintensities are noted bilaterally without other enhancement. No additional foci of hemorrhage are present. Ventricles are proportionate to the degree of atrophy. No significant extraaxial fluid collection is present. The internal auditory canals are within normal limits. Cerebellar atrophy is present. Brainstem and cerebellum are otherwise within normal  limits. Vascular: Flow is present in the major intracranial arteries. Skull and upper cervical spine: The craniocervical junction is normal. Upper cervical spine is within normal limits. Marrow signal is unremarkable. Sinuses/Orbits: The paranasal sinuses and mastoid air cells are clear. The globes and orbits are within normal limits. IMPRESSION: 1. Stable size and appearance of hemorrhagic mass lesion in the posterior right temporal lobe with surrounding edema and local mass effect. This is most compatible with a metastasis. The bulk of the central portion of the lesion is hemorrhage. 2. Two additional metastases noted, 1 in the high posterior right frontal lobe in the second in the anterior inferior left frontal lobe. 3. Diffuse subcortical and periventricular T2 hyperintensities bilaterally are moderately advanced for age. This likely reflects the sequela of chronic microvascular ischemia. Electronically Signed   By: San Morelle M.D.   On: 08/25/2020 13:06   MR BRAIN W WO CONTRAST  Result Date: 08/16/2020 CLINICAL DATA:  Non-small cell lung cancer stage EXAM: MRI HEAD WITHOUT AND WITH CONTRAST TECHNIQUE: Multiplanar, multiecho pulse sequences of the brain and surrounding structures were  obtained without and with intravenous contrast. CONTRAST:  Dose is currently not known, reference EMR COMPARISON:  07/13/2019 FINDINGS: Brain: Heterogeneous mass with areas of T1 hyperintensity and enhancement in the posterior and superior right temporal lobe measuring up to 4.2 cm with moderate to extensive adjacent vasogenic edema. There is local mass effect without midline shift. Second cortically based focus of nodular enhancement measuring 7 mm at the superior right frontal lobe. Notable brain atrophy. No obstructive hydrocephalus or extra-axial collection. Vascular: Normal flow voids Skull and upper cervical spine: Normal marrow signal Sinuses/Orbits: Negative A is being placed to the ordering provider for  patient disposition. IMPRESSION: 1. Cerebral metastatic disease, most notably a 4.2 cm hemorrhagic metastasis in the superior and posterior right temporal lobe with extensive vasogenic edema. 2. Second metastatic lesion measuring 7 mm at the right vertex. Electronically Signed   By: Monte Fantasia M.D.   On: 08/16/2020 07:21   NM PET Image Initial (PI) Skull Base To Thigh  Result Date: 08/23/2020 CLINICAL DATA:  Initial treatment strategy for non-small cell lung cancer. Brain metastasis. EXAM: NUCLEAR MEDICINE PET SKULL BASE TO THIGH TECHNIQUE: 9.9 mCi F-18 FDG was injected intravenously. Full-ring PET imaging was performed from the skull base to thigh after the radiotracer. CT data was obtained and used for attenuation correction and anatomic localization. Fasting blood glucose: 106 mg/dl COMPARISON:  CT 07/23/2020 FINDINGS: Mediastinal blood pool activity: SUV max 2.4 Liver activity: SUV max NA NECK: No hypermetabolic lymph nodes in the neck. Incidental CT findings: none CHEST: Lobular mass in the lingula measuring 6.5 x 6.7 cm with intense hypermetabolic peripheral activity with SUV max equal 17. Central hypo metabolism consistent with central necrosis. No hypermetabolic LEFT hilar nodes or mediastinal lymph nodes. No hypermetabolic supraclavicular nodes. Incidental CT findings: none ABDOMEN/PELVIS: No abnormal metabolic activity liver. Adrenal glands normal. Solid renal lesion of the LEFT kidney measuring 4.1 cm (image 110) has mild metabolic activity similar to renal parenchyma (SUV max equal 3.3). Low-density lesion of the RIGHT kidney medially measures 4.1 cm but also as metabolic activity SUV max equal 3.6. Lesion demonstrated on comparison CT scans of followed by urology (CT scan 14/97/0263) No hypermetabolic abdominopelvic nodal metastasis. There is hypermetabolic focus adjacent to the cecum and ileocecal valve without CT correlation with SUV max equal 19.8. Image 144. Incidental CT findings: none  SKELETON: No focal hypermetabolic activity to suggest skeletal metastasis. Incidental CT findings: none IMPRESSION: 1. Hypermetabolic lingular mass with central necrosis consistent primary bronchogenic carcinoma. 2. No evidence of mediastinal nodal metastasis. 3. Bilateral renal lesions with mild metabolic activity are concerning for a solid and cystic renal neoplasms. Metastatic lesions are not favored given the low metabolic activity. Lesions followed by urology. 4. Focus of intense metabolic activity near the ileocecal valve. This is an indeterminate finding. Recommend follow-up on routine surveillance. Electronically Signed   By: Suzy Bouchard M.D.   On: 08/23/2020 14:58   DG CHEST PORT 1 VIEW  Result Date: 08/20/2020 CLINICAL DATA:  87 year old male status post bronchoscopy of left lung mass. EXAM: PORTABLE CHEST 1 VIEW COMPARISON:  Chest CT 07/23/2020 and earlier. FINDINGS: Portable AP semi upright view at 0927 hours. Mildly lower lung volumes with stable mediastinal contour, and contour of the lobulated left lung mass bordering the mediastinum. No pneumothorax. No pleural effusion or new pulmonary opacity identified. Increased gastric air, within normal limits. Stable visualized osseous structures. IMPRESSION: Stable chest, no adverse features status post bronchoscopic evaluation of left lung mass. Electronically Signed  By: Odessa Fleming M.D.   On: 08/20/2020 09:43   DG C-ARM BRONCHOSCOPY  Result Date: 08/20/2020 C-ARM BRONCHOSCOPY: Fluoroscopy was utilized by the requesting physician.  No radiographic interpretation.     ASSESSMENT/PLAN:  1) Metastatic Melanoma he was diagnosed in May 2022.  He presented with a left upper lobe mass with possible extension to the chest wall as well as left hilar and mediastinal pleura and metastatic disease to the brain. Requested BRAF on 08/26/20.  2) History of oncogenic neoplasm of the left kidney followed for years by urology.   The patient will be  undergoing treatment of the metastatic brain lesions under the care of Dr. Mitzi Hansen. They are going to meet with neurosurgery to determine if he is a candidate for craniotomy and if so would offer preop SRS followed by surgery. If he is not a surgical candidate he would receive 3 fractions of SRS in 3 treatments along with Gabriel planned radiation to the chest.   The patient recently had a PET scan performed.  Dr. Arbutus Ped personally and independently reviewed the scan results and discussed results with patient today.  Dr. Arbutus Ped had a lengthy discussion today about the patient's current condition and recommended treatment options.  Dr. Arbutus Ped recommends the patient start systemic immunotherapy with Keytruda 200 mg IV every 3 weeks. The patient could also get combination Ipi/nivolumab, but this may have increased toxicities so Dr. Arbutus Ped would recommend single agent immunotherapy with Keytruda. The patient is interested in this option.   The priority for the patient is to complete Gabriel brain SRS and radiation to the chest. We will see the patient back in 3-4 weeks after he completes radiation to discuss Gabriel systemic treatment in more detail and make future arrangements at that time.  However, he was given a handout on Keytruda.   The patient was advised to call immediately if he has any concerning symptoms in the interval. The patient voices understanding of current disease status and treatment options and is in agreement with the current care plan. All questions were answered. The patient knows to call the clinic with any problems, questions or concerns. We can certainly see the patient much sooner if necessary    No orders of the defined types were placed in this encounter.     Tiarah Shisler L Aikam Hellickson, PA-C 08/26/20  ADDENDUM: Hematology/Oncology Attending: I had a face-to-face encounter with the patient today I reviewed Gabriel record, lab, pathology report as well as the scans and recommended Gabriel  care plan.  This is a very pleasant 87 years old white male recently diagnosed with stage IV malignant melanoma presented with large left upper lobe lung mass with extension to the chest wall as well as the left hilar and mediastinal pleura and metastatic disease to the brain diagnosed in May 2022. I had a lengthy discussion with the patient and Gabriel [name] today about Gabriel current disease stage, prognosis and treatment options. I personally and independently reviewed the scan images and discussed the results with the patient and Gabriel [name]. I recommended for the patient to proceed with the the surgical evaluation as well as radiotherapy to the metastatic brain lesions as recommended by Dr. Mitzi Hansen. Regarding the large left upper lobe lung mass, he may also benefit from a short course of palliative radiotherapy to this area especially with the chest wall invasion. We will send Gabriel biopsy for BRAF mutation testing. I would consider The patient for treatment with immunotherapy most likely with single agent pembrolizumab.  I doubt the patient will be able to tolerate the combination of ipilimumab and nivolumab because of concern about the toxicity of ipilimumab. I will arrange for the patient to come back for follow-up visit in around 3-4 weeks for more detailed discussion of Gabriel treatment options after the treatment for the brain metastasis. The patient and Gabriel [name] agreed to the current plan. He was advised to call immediately if he has any concerning symptoms in the interval.  The total time spent in the appointment was 35 minutes. Disclaimer: This note was dictated with voice recognition software. Similar sounding words can inadvertently be transcribed and may be missed upon review. Eilleen Kempf, MD 08/26/20

## 2020-08-23 ENCOUNTER — Other Ambulatory Visit: Payer: Self-pay

## 2020-08-23 ENCOUNTER — Ambulatory Visit
Admission: RE | Admit: 2020-08-23 | Discharge: 2020-08-23 | Disposition: A | Discharge status: Home / Self Care | Payer: Medicare Other | Source: Ambulatory Visit | Attending: Radiation Oncology | Admitting: Radiation Oncology

## 2020-08-23 DIAGNOSIS — C3412 Malignant neoplasm of upper lobe, left bronchus or lung: Secondary | ICD-10-CM | POA: Diagnosis not present

## 2020-08-23 DIAGNOSIS — C7931 Secondary malignant neoplasm of brain: Secondary | ICD-10-CM | POA: Insufficient documentation

## 2020-08-23 DIAGNOSIS — Z510 Encounter for antineoplastic radiation therapy: Secondary | ICD-10-CM | POA: Diagnosis present

## 2020-08-23 DIAGNOSIS — C7949 Secondary malignant neoplasm of other parts of nervous system: Secondary | ICD-10-CM

## 2020-08-23 LAB — CYTOLOGY - NON PAP

## 2020-08-23 IMAGING — MR MR HEAD WO/W CM
12 series · 48 of 48 positions shown · IV contrast (20 mL Multihance)
Comparison: MRI brain without and with contrast at 1.5 tesla
[DATE].

CLINICAL DATA: Non-small cell lung cancer. Treatment planning.

EXAM:
MRI HEAD WITHOUT AND WITH CONTRAST
TECHNIQUE: Multiplanar, multiecho pulse sequences of the brain and surrounding
structures were obtained without and with intravenous contrast.
CONTRAST:  20mL MULTIHANCE GADOBENATE DIMEGLUMINE 529 MG/ML IV SOLN

[Series 2: FLAIR · sagittal · 3.0mm · 0.94mm/px · 3 of 39 slices shown (1 of 2)]
[im 1/39]
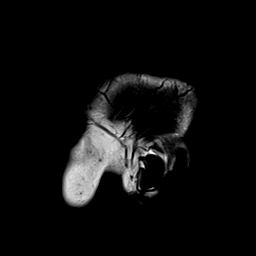
[im 20/39]
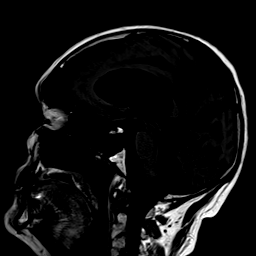
[im 39/39]
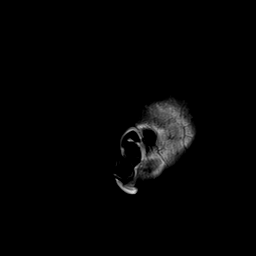

[Series 3: DWI · axial · 3.0mm · 1.50mm/px · z∈[-57,+114]mm · 4 of 90 slices shown (1 of 2)]
[im 1/90]
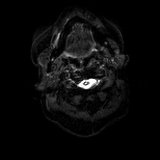
[im 30/90]
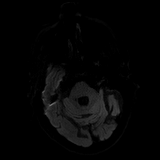
[im 60/90]
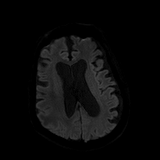
[im 90/90]
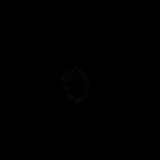

[Series 4: DWI · axial · 3.0mm · 1.50mm/px · z∈[-57,+114]mm · 2 of 45 slices shown (2 of 2)]
[im 1/45]
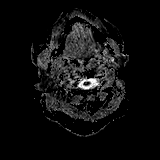
[im 45/45]
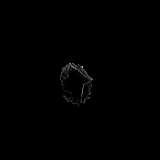

[Series 5: T2 · axial · 5.0mm · 0.86mm/px · 1 of 27 slices shown (1 of 2)]
[im 1/27]
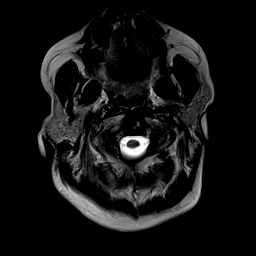

[Series 6: swi_images · axial · 1.5mm · 0.90mm/px · z∈[-50,+115]mm · 5 of 112 slices shown]
[im 1/112]
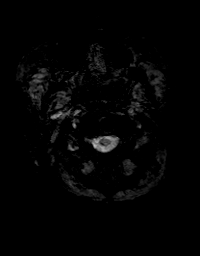
[im 28/112]
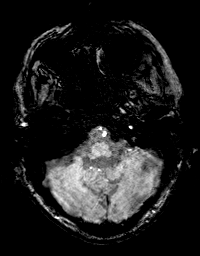
[im 56/112]
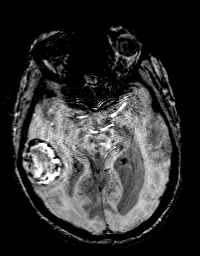
[im 84/112]
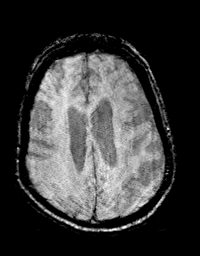
[im 112/112]
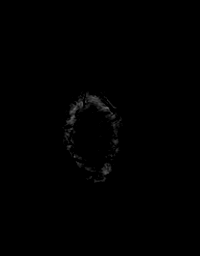

[Series 8: FLAIR · axial · 3.0mm · 0.86mm/px · z∈[-56,+109]mm · 3 of 56 slices shown (2 of 2)]
[im 1/56]
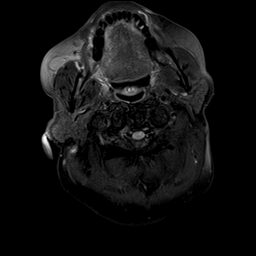
[im 28/56]
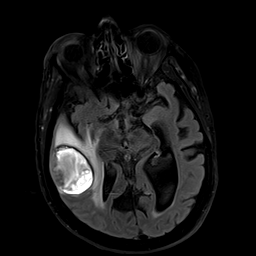
[im 56/56]
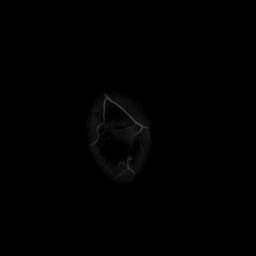

[Series 9: T2 · axial · non-contrast · 1.0mm · 0.86mm/px · z∈[-51,+105]mm · 8 of 160 slices shown (2 of 2)]
[im 1/160]
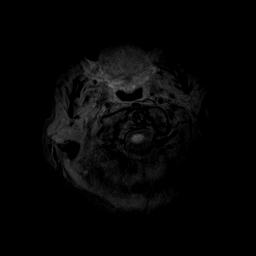
[im 23/160]
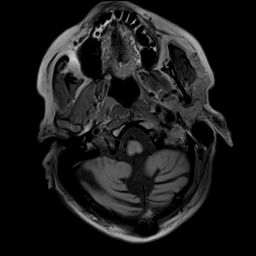
[im 46/160]
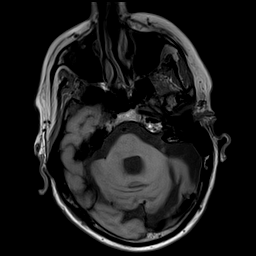
[im 69/160]
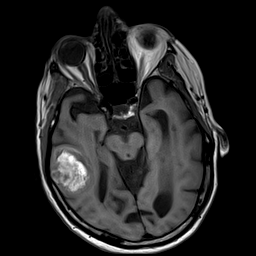
[im 91/160]
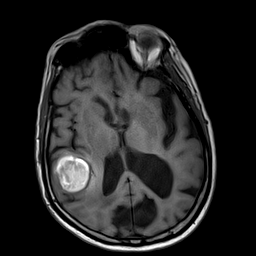
[im 114/160]
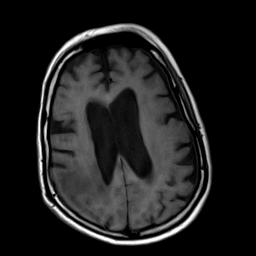
[im 137/160]
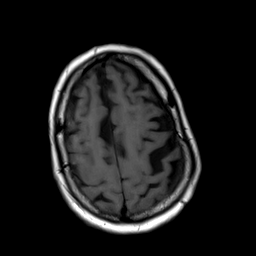
[im 160/160]
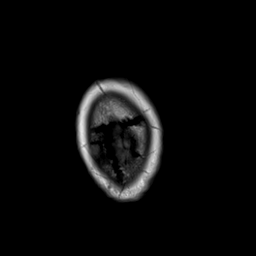

[Series 10: T2 post-contrast · coronal · 3.0mm · 0.86mm/px · 2 of 47 slices shown (1 of 2)]
[im 1/47]
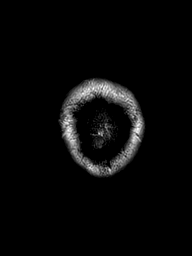
[im 47/47]
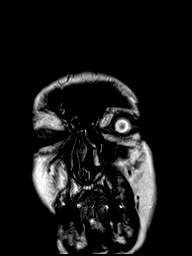

[Series 11: T2 post-contrast · axial · 1.0mm · 0.86mm/px · z∈[-51,+105]mm · 8 of 160 slices shown (2 of 2)]
[im 1/160]
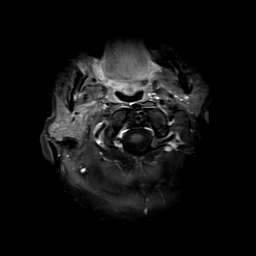
[im 23/160]
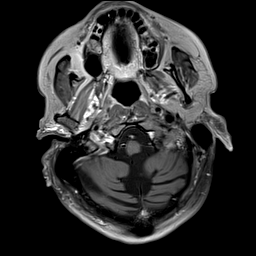
[im 46/160]
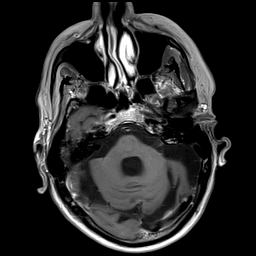
[im 69/160]
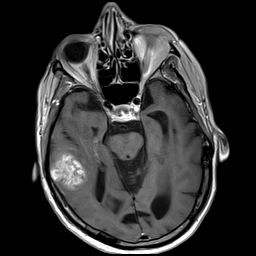
[im 91/160]
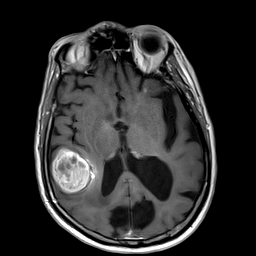
[im 114/160]
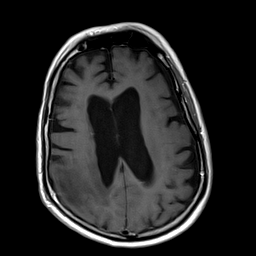
[im 137/160]
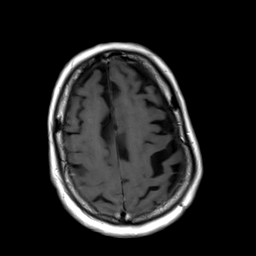
[im 160/160]
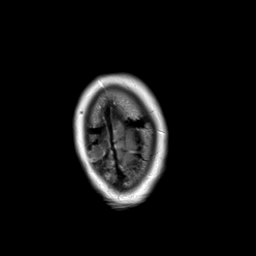

[Series 12: T1 post-contrast · axial · 1.0mm · 0.94mm/px · z∈[-53,+106]mm · 8 of 160 slices shown (1 of 2)]
[im 1/160]
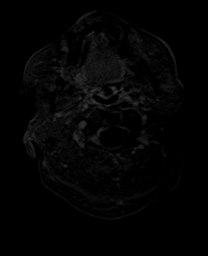
[im 23/160]
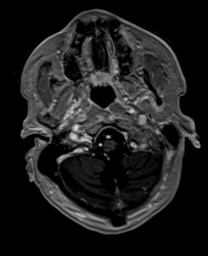
[im 46/160]
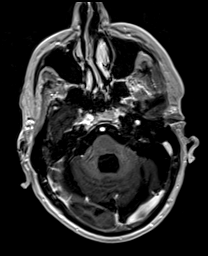
[im 69/160]
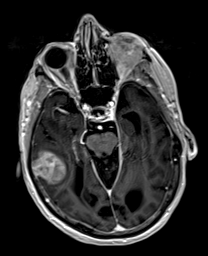
[im 91/160]
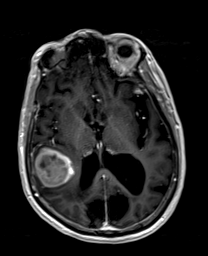
[im 114/160]
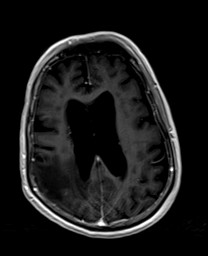
[im 137/160]
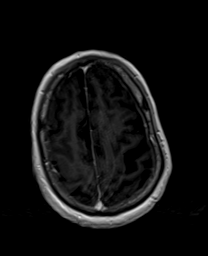
[im 160/160]
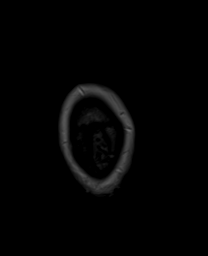

[Series 13: T1 post-contrast · coronal · 3.0mm · 0.86mm/px · 2 of 47 slices shown (2 of 2)]
[im 1/47]
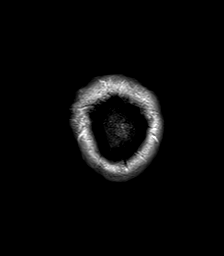
[im 47/47]
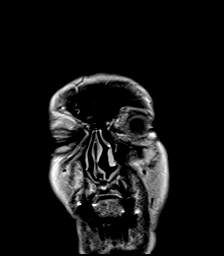

[Series 14: FLAIR post-contrast · sagittal · 3.0mm · 0.94mm/px · 2 of 39 slices shown]
[im 1/39]
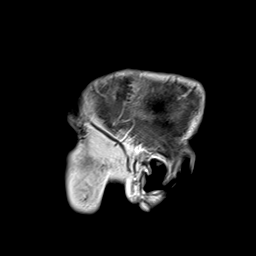
[im 39/39]
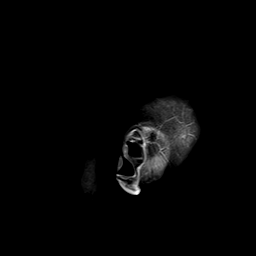

[48 of 48 positions shown; findings below may reference images not displayed]

FINDINGS: Brain: A hemorrhagic mass lesion is again noted in the posterior
right temporal lobe. Extensive T1 shortening on the precontrast
images is compatible with intrinsic hemorrhage. There is some
enhancement most notably along the medial and anterior components of
the lesion. The entirety of the lesion measures 4.5 x 3.8 x 3.7 cm,
not significantly changed from prior exam. There is local mass
effect in surrounding edema. Vasogenic edema extends to the anterior
temporal lobe and superiorly within the right parietal lobe.

A rim enhancing lesion is present in the posterior high right
parietal lobe measuring 7 x 7 x 6 mm. Mild surrounding edema noted.
There is no hemorrhage associated with the more superior lesion.

A 3 mm nonhemorrhagic enhancing lesion is noted in the anterior
inferior left frontal lobe on image 90 of series 11.

No additional enhancing lesions are present.

The posterior right temporal lobe lesion creates mass effect with
effacement the sulci and partial effacement of the right lateral
ventricle. No midline shift is present.

Diffuse scattered subcortical and periventricular T2
hyperintensities are noted bilaterally without other enhancement. No
additional foci of hemorrhage are present.

Ventricles are proportionate to the degree of atrophy. No
significant extraaxial fluid collection is present.

The internal auditory canals are within normal limits. Cerebellar
atrophy is present. Brainstem and cerebellum are otherwise within
normal limits.

Vascular: Flow is present in the major intracranial arteries.

Skull and upper cervical spine: The craniocervical junction is
normal. Upper cervical spine is within normal limits. Marrow signal
is unremarkable.

Sinuses/Orbits: The paranasal sinuses and mastoid air cells are
clear. The globes and orbits are within normal limits.
IMPRESSION: 1. Stable size and appearance of hemorrhagic mass lesion in the
posterior right temporal lobe with surrounding edema and local mass
effect. This is most compatible with a metastasis. The bulk of the
central portion of the lesion is hemorrhage.
2. Two additional metastases noted, 1 in the high posterior right
frontal lobe in the second in the anterior inferior left frontal
lobe.
3. Diffuse subcortical and periventricular T2 hyperintensities
bilaterally are moderately advanced for age. This likely reflects
the sequela of chronic microvascular ischemia.

## 2020-08-23 MED ORDER — GADOBENATE DIMEGLUMINE 529 MG/ML IV SOLN
20.0000 mL | Freq: Once | INTRAVENOUS | Status: AC | PRN
  Administered 2020-08-23: 20 mL via INTRAVENOUS

## 2020-08-23 NOTE — Progress Notes (Signed)
Called and spoke with patient regarding biopsy results.  Lung biopsy tissue positive for melanoma.  Follow-up scheduled with TP on 5/31.  Thanks,  BLI  Garner Nash, DO [facility] 08/23/2020 12:58 PM

## 2020-08-26 ENCOUNTER — Other Ambulatory Visit: Payer: Self-pay | Admitting: Neurosurgery

## 2020-08-26 ENCOUNTER — Ambulatory Visit: Payer: Medicare Other | Admitting: Radiation Oncology

## 2020-08-26 ENCOUNTER — Inpatient Hospital Stay (HOSPITAL_BASED_OUTPATIENT_CLINIC_OR_DEPARTMENT_OTHER): Payer: Medicare Other | Admitting: Physician Assistant

## 2020-08-26 ENCOUNTER — Inpatient Hospital Stay: Payer: Medicare Other

## 2020-08-26 ENCOUNTER — Telehealth: Payer: Self-pay | Admitting: Radiation Oncology

## 2020-08-26 ENCOUNTER — Other Ambulatory Visit: Payer: Self-pay

## 2020-08-26 ENCOUNTER — Encounter: Payer: Self-pay | Admitting: Physician Assistant

## 2020-08-26 VITALS — BP 132/77 | HR 67 | Temp 97.6°F | Resp 17 | Ht 70.0 in | Wt 192.2 lb

## 2020-08-26 DIAGNOSIS — C7931 Secondary malignant neoplasm of brain: Secondary | ICD-10-CM | POA: Diagnosis not present

## 2020-08-26 DIAGNOSIS — C439 Malignant melanoma of skin, unspecified: Secondary | ICD-10-CM

## 2020-08-26 DIAGNOSIS — C3412 Malignant neoplasm of upper lobe, left bronchus or lung: Secondary | ICD-10-CM | POA: Diagnosis not present

## 2020-08-26 DIAGNOSIS — C799 Secondary malignant neoplasm of unspecified site: Secondary | ICD-10-CM

## 2020-08-26 NOTE — Telephone Encounter (Signed)
I called the patient and his wife and we discussed the conversation about his case from conference this am. He has metastatic melanoma rather than lung carcinoma. He is going to meet with neurosurgery to determine if he is a candidate for craniotomy and if so would offer preop SRS followed by surgery. If he is not a surgical candidate he would receive 3 fractions of SRS in 3 treatments along with his planned radiation to the chest. He and his wife are in agreement with this plan.

## 2020-08-26 NOTE — Progress Notes (Signed)
Has armband been applied?  Yes  Does patient have an allergy to IV contrast dye?: No   Has patient ever received premedication for IV contrast dye?: n/a  Does patient take metformin?: No  If patient does take metformin when was the last dose: n/a  Date of lab work: 08/20/2020 BUN: 31 CR: 1.29 eGFR: 54  IV site: RAC  Has IV site been added to flowsheet?  Yes  BP (!) 134/56 (BP Location: Left Arm, Patient Position: Sitting)   Pulse 65   Temp (!) 96.8 F (36 C) (Temporal)   Resp 18   Ht _0  (1.778 m)   Wt 193 lb (87.5 kg)   SpO2 100%   BMI 27.69 kg/m

## 2020-08-26 NOTE — Patient Instructions (Addendum)
Summary:  -The sample (biopsy) that they took of your tumor was consistent with melanoma -We covered a lot of important information at your appointment today regarding what the treatment plan is moving forward. Here are the the main points that were discussed at your office visit with [place] today:  -The treatment consists of one immunotherapy drug called Keytruda (pembrolizumab).  -We will see you in about 3-4 weeks and we will discuss this in more detail at that time.  -Your treatment will be given once every 3 weeks.   Follow up:    -If you need to reach [place] at any time, the main office number to the cancer center is [phone], when you call, ask to speak to either Cassie's or Dr. Worthy Flank nurse.   Pembrolizumab injection What is this medicine? PEMBROLIZUMAB (pem broe liz ue mab) is a monoclonal antibody. It is used to treat certain types of cancer. This medicine may be used for other purposes; ask your health care provider or pharmacist if you have questions. COMMON BRAND NAME(S): Keytruda What should I tell my health care provider before I take this medicine? They need to know if you have any of these conditions:  autoimmune diseases like Crohn's disease, ulcerative colitis, or lupus  have had or planning to have an allogeneic stem cell transplant (uses someone else's stem cells)  history of organ transplant  history of chest radiation  nervous system problems like myasthenia gravis or Guillain-Barre syndrome  an unusual or allergic reaction to pembrolizumab, other medicines, foods, dyes, or preservatives  pregnant or trying to get pregnant  breast-feeding How should I use this medicine? This medicine is for infusion into a vein. It is given by a health care professional in a hospital or clinic setting. A special MedGuide will be given to you before each treatment. Be sure to read this information carefully each time. Talk to your pediatrician regarding the use of this  medicine in children. While this drug may be prescribed for children as young as 6 months for selected conditions, precautions do apply. Overdosage: If you think you have taken too much of this medicine contact a poison control center or emergency room at once. NOTE: This medicine is only for you. Do not share this medicine with others. What if I miss a dose? It is important not to miss your dose. Call your doctor or health care professional if you are unable to keep an appointment. What may interact with this medicine? Interactions have not been studied. This list may not describe all possible interactions. Give your health care provider a list of all the medicines, herbs, non-prescription drugs, or dietary supplements you use. Also tell them if you smoke, drink alcohol, or use illegal drugs. Some items may interact with your medicine. What should I watch for while using this medicine? Your condition will be monitored carefully while you are receiving this medicine. You may need blood work done while you are taking this medicine. Do not become pregnant while taking this medicine or for 4 months after stopping it. Women should inform their doctor if they wish to become pregnant or think they might be pregnant. There is a potential for serious side effects to an unborn child. Talk to your health care professional or pharmacist for more information. Do not breast-feed an infant while taking this medicine or for 4 months after the last dose. What side effects may I notice from receiving this medicine? Side effects that you should report to your doctor  or health care professional as soon as possible:  allergic reactions like skin rash, itching or hives, swelling of the face, lips, or tongue  bloody or black, tarry  breathing problems  changes in vision  chest pain  chills  confusion  constipation  cough  diarrhea  dizziness or feeling faint or lightheaded  fast or irregular  heartbeat  fever  flushing  joint pain  low blood counts - this medicine may decrease the number of white blood cells, red blood cells and platelets. You may be at increased risk for infections and bleeding.  muscle pain  muscle weakness  pain, tingling, numbness in the hands or feet  persistent headache  redness, blistering, peeling or loosening of the skin, including inside the mouth  signs and symptoms of high blood sugar such as dizziness; dry mouth; dry skin; fruity breath; nausea; stomach pain; increased hunger or thirst; increased urination  signs and symptoms of kidney injury like trouble passing urine or change in the amount of urine  signs and symptoms of liver injury like dark urine, light-colored stools, loss of appetite, nausea, right upper belly pain, yellowing of the eyes or skin  sweating  swollen lymph nodes  weight loss Side effects that usually do not require medical attention (report to your doctor or health care professional if they continue or are bothersome):  decreased appetite  hair loss  tiredness This list may not describe all possible side effects. Call your doctor for medical advice about side effects. You may report side effects to FDA at 1-800-FDA-1088. Where should I keep my medicine? This drug is given in a hospital or clinic and will not be stored at home. NOTE: This sheet is a summary. It may not cover all possible information. If you have questions about this medicine, talk to your doctor, pharmacist, or health care provider.  2021 Elsevier/Gold Standard (2019-02-22 21:44:53)

## 2020-08-27 ENCOUNTER — Ambulatory Visit
Admission: RE | Admit: 2020-08-27 | Discharge: 2020-08-27 | Disposition: A | Discharge status: Home / Self Care | Payer: Medicare Other | Source: Ambulatory Visit | Attending: Radiation Oncology | Admitting: Radiation Oncology

## 2020-08-27 VITALS — BP 134/56 | HR 65 | Temp 96.8°F | Resp 18 | Ht 70.0 in | Wt 193.0 lb

## 2020-08-27 DIAGNOSIS — Z510 Encounter for antineoplastic radiation therapy: Secondary | ICD-10-CM | POA: Diagnosis not present

## 2020-08-27 DIAGNOSIS — C7931 Secondary malignant neoplasm of brain: Secondary | ICD-10-CM

## 2020-08-27 MED ORDER — SODIUM CHLORIDE 0.9% FLUSH
10.0000 mL | Freq: Once | INTRAVENOUS | Status: AC
  Administered 2020-08-27: 10 mL via INTRAVENOUS

## 2020-08-28 ENCOUNTER — Other Ambulatory Visit: Payer: Self-pay

## 2020-08-28 ENCOUNTER — Other Ambulatory Visit: Payer: Self-pay | Admitting: Radiation Therapy

## 2020-08-28 ENCOUNTER — Telehealth: Payer: Self-pay | Admitting: Medical Oncology

## 2020-08-28 ENCOUNTER — Ambulatory Visit
Admission: RE | Admit: 2020-08-28 | Discharge: 2020-08-28 | Disposition: A | Discharge status: Home / Self Care | Payer: Medicare Other | Source: Ambulatory Visit | Attending: Radiation Oncology | Admitting: Radiation Oncology

## 2020-08-28 DIAGNOSIS — Z510 Encounter for antineoplastic radiation therapy: Secondary | ICD-10-CM | POA: Diagnosis not present

## 2020-08-28 NOTE — Telephone Encounter (Signed)
I returned wife's call per her request -no answer.   Her call  was regarding an  appt and to get  [facility] phone number . I cc VM to Stevan Born.

## 2020-08-29 ENCOUNTER — Ambulatory Visit
Admission: RE | Admit: 2020-08-29 | Discharge: 2020-08-29 | Disposition: A | Discharge status: Home / Self Care | Payer: Medicare Other | Source: Ambulatory Visit | Attending: Radiation Oncology | Admitting: Radiation Oncology

## 2020-08-29 ENCOUNTER — Other Ambulatory Visit: Payer: Self-pay

## 2020-08-29 DIAGNOSIS — Z510 Encounter for antineoplastic radiation therapy: Secondary | ICD-10-CM | POA: Diagnosis not present

## 2020-08-30 ENCOUNTER — Encounter: Payer: Self-pay | Admitting: Radiation Oncology

## 2020-08-30 ENCOUNTER — Ambulatory Visit
Admission: RE | Admit: 2020-08-30 | Discharge: 2020-08-30 | Disposition: A | Discharge status: Home / Self Care | Payer: Medicare Other | Source: Ambulatory Visit | Attending: Radiation Oncology | Admitting: Radiation Oncology

## 2020-08-30 ENCOUNTER — Other Ambulatory Visit: Payer: Self-pay

## 2020-08-30 DIAGNOSIS — Z510 Encounter for antineoplastic radiation therapy: Secondary | ICD-10-CM | POA: Diagnosis not present

## 2020-08-31 ENCOUNTER — Encounter (HOSPITAL_COMMUNITY): Payer: Self-pay | Admitting: Neurosurgery

## 2020-08-31 NOTE — Progress Notes (Addendum)
Pt. Wanted me to give instructions to his wife, [name]. Instructed to arrive at 0830, pt. Needing a covid test on DOS. Denies sob/chest pain. NPO at midnight. Plavix stopped 08/29/20. To wear a mask when out in public and sanitize hands

## 2020-09-03 ENCOUNTER — Inpatient Hospital Stay (HOSPITAL_COMMUNITY)
Admission: RE | Admit: 2020-09-03 | Discharge: 2020-09-10 | DRG: 026 | Disposition: A | Discharge status: Rehabilitation Facility | Payer: Medicare Other | Attending: Neurosurgery | Admitting: Neurosurgery

## 2020-09-03 ENCOUNTER — Encounter (HOSPITAL_COMMUNITY): Payer: Self-pay | Admitting: Neurosurgery

## 2020-09-03 ENCOUNTER — Ambulatory Visit: Payer: Medicare Other | Admitting: Adult Health

## 2020-09-03 ENCOUNTER — Other Ambulatory Visit: Payer: Self-pay

## 2020-09-03 ENCOUNTER — Encounter (HOSPITAL_COMMUNITY)
Admission: RE | Disposition: A | Discharge status: Rehabilitation Facility | Payer: Self-pay | Source: Home / Self Care | Attending: Neurosurgery

## 2020-09-03 ENCOUNTER — Inpatient Hospital Stay (HOSPITAL_COMMUNITY): Payer: Medicare Other | Admitting: Anesthesiology

## 2020-09-03 ENCOUNTER — Encounter (HOSPITAL_COMMUNITY): Payer: Self-pay | Admitting: Internal Medicine

## 2020-09-03 ENCOUNTER — Ambulatory Visit: Payer: Medicare Other

## 2020-09-03 DIAGNOSIS — Z7902 Long term (current) use of antithrombotics/antiplatelets: Secondary | ICD-10-CM | POA: Diagnosis not present

## 2020-09-03 DIAGNOSIS — K5900 Constipation, unspecified: Secondary | ICD-10-CM | POA: Diagnosis present

## 2020-09-03 DIAGNOSIS — Z8042 Family history of malignant neoplasm of prostate: Secondary | ICD-10-CM

## 2020-09-03 DIAGNOSIS — C7801 Secondary malignant neoplasm of right lung: Secondary | ICD-10-CM | POA: Diagnosis present

## 2020-09-03 DIAGNOSIS — E7800 Pure hypercholesterolemia, unspecified: Secondary | ICD-10-CM | POA: Diagnosis present

## 2020-09-03 DIAGNOSIS — I10 Essential (primary) hypertension: Secondary | ICD-10-CM | POA: Diagnosis present

## 2020-09-03 DIAGNOSIS — Z7189 Other specified counseling: Secondary | ICD-10-CM | POA: Diagnosis not present

## 2020-09-03 DIAGNOSIS — K649 Unspecified hemorrhoids: Secondary | ICD-10-CM | POA: Diagnosis present

## 2020-09-03 DIAGNOSIS — E785 Hyperlipidemia, unspecified: Secondary | ICD-10-CM | POA: Diagnosis present

## 2020-09-03 DIAGNOSIS — K625 Hemorrhage of anus and rectum: Secondary | ICD-10-CM | POA: Diagnosis not present

## 2020-09-03 DIAGNOSIS — R2689 Other abnormalities of gait and mobility: Secondary | ICD-10-CM | POA: Diagnosis present

## 2020-09-03 DIAGNOSIS — Z66 Do not resuscitate: Secondary | ICD-10-CM | POA: Diagnosis not present

## 2020-09-03 DIAGNOSIS — Z8249 Family history of ischemic heart disease and other diseases of the circulatory system: Secondary | ICD-10-CM | POA: Diagnosis not present

## 2020-09-03 DIAGNOSIS — K5901 Slow transit constipation: Secondary | ICD-10-CM | POA: Diagnosis not present

## 2020-09-03 DIAGNOSIS — R5381 Other malaise: Secondary | ICD-10-CM | POA: Diagnosis present

## 2020-09-03 DIAGNOSIS — I69398 Other sequelae of cerebral infarction: Secondary | ICD-10-CM | POA: Diagnosis not present

## 2020-09-03 DIAGNOSIS — Z79899 Other long term (current) drug therapy: Secondary | ICD-10-CM

## 2020-09-03 DIAGNOSIS — C7931 Secondary malignant neoplasm of brain: Principal | ICD-10-CM | POA: Diagnosis present

## 2020-09-03 DIAGNOSIS — C439 Malignant melanoma of skin, unspecified: Secondary | ICD-10-CM | POA: Diagnosis present

## 2020-09-03 DIAGNOSIS — Z20822 Contact with and (suspected) exposure to covid-19: Secondary | ICD-10-CM | POA: Diagnosis present

## 2020-09-03 DIAGNOSIS — G8918 Other acute postprocedural pain: Secondary | ICD-10-CM | POA: Diagnosis not present

## 2020-09-03 DIAGNOSIS — C799 Secondary malignant neoplasm of unspecified site: Secondary | ICD-10-CM | POA: Diagnosis not present

## 2020-09-03 DIAGNOSIS — Z515 Encounter for palliative care: Secondary | ICD-10-CM | POA: Diagnosis not present

## 2020-09-03 HISTORY — PX: CRANIOTOMY: SHX93

## 2020-09-03 HISTORY — PX: APPLICATION OF CRANIAL NAVIGATION: SHX6578

## 2020-09-03 LAB — CBC
HCT: 37.6 % — ABNORMAL LOW (ref 39.0–52.0)
Hemoglobin: 11.4 g/dL — ABNORMAL LOW (ref 13.0–17.0)
MCH: 27.2 pg (ref 26.0–34.0)
MCHC: 30.3 g/dL (ref 30.0–36.0)
MCV: 89.7 fL (ref 80.0–100.0)
Platelets: 128 10*3/uL — ABNORMAL LOW (ref 150–400)
RBC: 4.19 MIL/uL — ABNORMAL LOW (ref 4.22–5.81)
RDW: 17.7 % — ABNORMAL HIGH (ref 11.5–15.5)
WBC: 7.4 10*3/uL (ref 4.0–10.5)
nRBC: 0 % (ref 0.0–0.2)

## 2020-09-03 LAB — BASIC METABOLIC PANEL
Anion gap: 9 (ref 5–15)
BUN: 29 mg/dL — ABNORMAL HIGH (ref 8–23)
CO2: 23 mmol/L (ref 22–32)
Calcium: 8.2 mg/dL — ABNORMAL LOW (ref 8.9–10.3)
Chloride: 102 mmol/L (ref 98–111)
Creatinine, Ser: 0.95 mg/dL (ref 0.61–1.24)
GFR, Estimated: >60 mL/min (ref >60)
Glucose, Bld: 95 mg/dL (ref 70–99)
Potassium: 3.7 mmol/L (ref 3.5–5.1)
Sodium: 134 mmol/L — ABNORMAL LOW (ref 135–145)

## 2020-09-03 LAB — POCT I-STAT 7, (LYTES, BLD GAS, ICA,H+H)
Acid-Base Excess: 1 mmol/L (ref 0.0–2.0)
Bicarbonate: 25.3 mmol/L (ref 20.0–28.0)
Calcium, Ion: 1.1 mmol/L — ABNORMAL LOW (ref 1.15–1.40)
HCT: 32 % — ABNORMAL LOW (ref 39.0–52.0)
Hemoglobin: 10.9 g/dL — ABNORMAL LOW (ref 13.0–17.0)
O2 Saturation: 100 %
Potassium: 4.4 mmol/L (ref 3.5–5.1)
Sodium: 136 mmol/L (ref 135–145)
TCO2: 26 mmol/L (ref 22–32)
pCO2 arterial: 38.9 mmHg (ref 32.0–48.0)
pH, Arterial: 7.42 (ref 7.350–7.450)
pO2, Arterial: 338 mmHg — ABNORMAL HIGH (ref 83.0–108.0)

## 2020-09-03 LAB — TYPE AND SCREEN
ABO/RH(D): A POS
Antibody Screen: NEGATIVE

## 2020-09-03 LAB — MRSA PCR SCREENING: MRSA by PCR: NEGATIVE

## 2020-09-03 LAB — SARS CORONAVIRUS 2 BY RT PCR (HOSPITAL ORDER, PERFORMED IN ~~LOC~~ HOSPITAL LAB): SARS Coronavirus 2: NEGATIVE

## 2020-09-03 LAB — ABO/RH: ABO/RH(D): A POS

## 2020-09-03 SURGERY — CRANIOTOMY TUMOR EXCISION
Anesthesia: General | Site: Head | Laterality: Right

## 2020-09-03 MED ORDER — CEFAZOLIN SODIUM-DEXTROSE 2-4 GM/100ML-% IV SOLN
2.0000 g | Freq: Three times a day (TID) | INTRAVENOUS | Status: AC
  Administered 2020-09-03 – 2020-09-04 (×2): 2 g via INTRAVENOUS
  Filled 2020-09-03 (×2): qty 100

## 2020-09-03 MED ORDER — HEMOSTATIC AGENTS (NO CHARGE) OPTIME
TOPICAL | Status: DC | PRN
  Administered 2020-09-03 (×2): 1 via TOPICAL

## 2020-09-03 MED ORDER — CHLORHEXIDINE GLUCONATE CLOTH 2 % EX PADS
6.0000 | MEDICATED_PAD | Freq: Every day | CUTANEOUS | Status: DC
  Administered 2020-09-03 – 2020-09-05 (×3): 6 via TOPICAL

## 2020-09-03 MED ORDER — BISACODYL 10 MG RE SUPP: 10.0000 mg | Freq: Every day | RECTAL | Status: DC | PRN

## 2020-09-03 MED ORDER — ONDANSETRON HCL 4 MG/2ML IJ SOLN
INTRAMUSCULAR | Status: AC
  Filled 2020-09-03: qty 2

## 2020-09-03 MED ORDER — BACITRACIN ZINC 500 UNIT/GM EX OINT
TOPICAL_OINTMENT | CUTANEOUS | Status: DC | PRN
  Administered 2020-09-03: 1 via TOPICAL

## 2020-09-03 MED ORDER — DEXAMETHASONE SODIUM PHOSPHATE 10 MG/ML IJ SOLN
INTRAMUSCULAR | Status: AC
  Filled 2020-09-03: qty 1

## 2020-09-03 MED ORDER — LEVETIRACETAM IN NACL 500 MG/100ML IV SOLN
500.0000 mg | Freq: Two times a day (BID) | INTRAVENOUS | Status: DC
  Administered 2020-09-03 – 2020-09-06 (×7): 500 mg via INTRAVENOUS
  Filled 2020-09-03 (×6): qty 100

## 2020-09-03 MED ORDER — ACETAMINOPHEN 650 MG RE SUPP: 650.0000 mg | RECTAL | Status: DC | PRN

## 2020-09-03 MED ORDER — FENTANYL CITRATE (PF) 250 MCG/5ML IJ SOLN
INTRAMUSCULAR | Status: AC
  Filled 2020-09-03: qty 5

## 2020-09-03 MED ORDER — THROMBIN 20000 UNITS EX SOLR
CUTANEOUS | Status: AC
  Filled 2020-09-03: qty 20000

## 2020-09-03 MED ORDER — ONDANSETRON HCL 4 MG/2ML IJ SOLN: 4.0000 mg | Freq: Once | INTRAMUSCULAR | Status: DC | PRN

## 2020-09-03 MED ORDER — SODIUM CHLORIDE 0.9 % IV SOLN: INTRAVENOUS | Status: DC

## 2020-09-03 MED ORDER — PROMETHAZINE HCL 25 MG PO TABS
12.5000 mg | ORAL_TABLET | ORAL | Status: DC | PRN
  Filled 2020-09-03: qty 1

## 2020-09-03 MED ORDER — ROCURONIUM BROMIDE 10 MG/ML (PF) SYRINGE
PREFILLED_SYRINGE | INTRAVENOUS | Status: DC | PRN
  Administered 2020-09-03: 20 mg via INTRAVENOUS
  Administered 2020-09-03: 70 mg via INTRAVENOUS

## 2020-09-03 MED ORDER — ACETAMINOPHEN 325 MG PO TABS
650.0000 mg | ORAL_TABLET | ORAL | Status: DC | PRN
  Administered 2020-09-03 – 2020-09-06 (×3): 650 mg via ORAL
  Filled 2020-09-03 (×3): qty 2

## 2020-09-03 MED ORDER — LIDOCAINE-EPINEPHRINE 1 %-1:100000 IJ SOLN
INTRAMUSCULAR | Status: DC | PRN
  Administered 2020-09-03: 5 mL

## 2020-09-03 MED ORDER — SODIUM CHLORIDE 0.9 % IV SOLN: INTRAVENOUS | Status: DC | PRN

## 2020-09-03 MED ORDER — PROPOFOL 10 MG/ML IV BOLUS
INTRAVENOUS | Status: DC | PRN
  Administered 2020-09-03: 70 mg via INTRAVENOUS
  Administered 2020-09-03: 50 mg via INTRAVENOUS
  Administered 2020-09-03: 30 mg via INTRAVENOUS

## 2020-09-03 MED ORDER — GLYCOPYRROLATE PF 0.2 MG/ML IJ SOSY
PREFILLED_SYRINGE | INTRAMUSCULAR | Status: DC | PRN
  Administered 2020-09-03: .2 mg via INTRAVENOUS

## 2020-09-03 MED ORDER — 0.9 % SODIUM CHLORIDE (POUR BTL) OPTIME
TOPICAL | Status: DC | PRN
  Administered 2020-09-03 (×2): 1000 mL

## 2020-09-03 MED ORDER — SENNA 8.6 MG PO TABS
1.0000 | ORAL_TABLET | Freq: Two times a day (BID) | ORAL | Status: DC
  Administered 2020-09-03 – 2020-09-10 (×14): 8.6 mg via ORAL
  Filled 2020-09-03 (×14): qty 1

## 2020-09-03 MED ORDER — LABETALOL HCL 5 MG/ML IV SOLN: 10.0000 mg | INTRAVENOUS | Status: DC | PRN

## 2020-09-03 MED ORDER — PHENYLEPHRINE HCL (PRESSORS) 10 MG/ML IV SOLN
INTRAVENOUS | Status: DC | PRN
  Administered 2020-09-03 (×2): 80 ug via INTRAVENOUS

## 2020-09-03 MED ORDER — FENTANYL CITRATE (PF) 250 MCG/5ML IJ SOLN
INTRAMUSCULAR | Status: DC | PRN
  Administered 2020-09-03: 50 ug via INTRAVENOUS
  Administered 2020-09-03: 100 ug via INTRAVENOUS

## 2020-09-03 MED ORDER — DEXAMETHASONE 4 MG PO TABS
4.0000 mg | ORAL_TABLET | Freq: Four times a day (QID) | ORAL | Status: AC
  Administered 2020-09-04 – 2020-09-05 (×4): 4 mg via ORAL
  Filled 2020-09-03 (×3): qty 1

## 2020-09-03 MED ORDER — BACITRACIN ZINC 500 UNIT/GM EX OINT
TOPICAL_OINTMENT | CUTANEOUS | Status: AC
  Filled 2020-09-03: qty 28.35

## 2020-09-03 MED ORDER — THROMBIN 5000 UNITS EX SOLR: OROMUCOSAL | Status: DC | PRN

## 2020-09-03 MED ORDER — MAGNESIUM CITRATE PO SOLN: 1.0000 | Freq: Once | ORAL | Status: DC | PRN

## 2020-09-03 MED ORDER — ATORVASTATIN CALCIUM 40 MG PO TABS
40.0000 mg | ORAL_TABLET | Freq: Every day | ORAL | Status: DC
  Administered 2020-09-03 – 2020-09-10 (×8): 40 mg via ORAL
  Filled 2020-09-03 (×8): qty 1

## 2020-09-03 MED ORDER — ONDANSETRON HCL 4 MG/2ML IJ SOLN
INTRAMUSCULAR | Status: DC | PRN
  Administered 2020-09-03: 4 mg via INTRAVENOUS

## 2020-09-03 MED ORDER — LIDOCAINE 2% (20 MG/ML) 5 ML SYRINGE
INTRAMUSCULAR | Status: DC | PRN
  Administered 2020-09-03: 100 mg via INTRAVENOUS

## 2020-09-03 MED ORDER — PANTOPRAZOLE SODIUM 40 MG IV SOLR
40.0000 mg | Freq: Every day | INTRAVENOUS | Status: DC
  Administered 2020-09-03 – 2020-09-05 (×3): 40 mg via INTRAVENOUS
  Filled 2020-09-03 (×3): qty 40

## 2020-09-03 MED ORDER — THROMBIN 5000 UNITS EX SOLR
CUTANEOUS | Status: AC
  Filled 2020-09-03: qty 5000

## 2020-09-03 MED ORDER — ORAL CARE MOUTH RINSE: 15.0000 mL | Freq: Once | OROMUCOSAL | Status: AC

## 2020-09-03 MED ORDER — LIDOCAINE 2% (20 MG/ML) 5 ML SYRINGE
INTRAMUSCULAR | Status: AC
  Filled 2020-09-03: qty 10

## 2020-09-03 MED ORDER — CHLORHEXIDINE GLUCONATE 0.12 % MT SOLN
15.0000 mL | Freq: Once | OROMUCOSAL | Status: AC
  Administered 2020-09-03: 15 mL via OROMUCOSAL
  Filled 2020-09-03: qty 15

## 2020-09-03 MED ORDER — ONDANSETRON HCL 4 MG PO TABS: 4.0000 mg | ORAL_TABLET | ORAL | Status: DC | PRN

## 2020-09-03 MED ORDER — PHENYLEPHRINE HCL-NACL 10-0.9 MG/250ML-% IV SOLN
INTRAVENOUS | Status: DC | PRN
  Administered 2020-09-03: 25 ug/min via INTRAVENOUS

## 2020-09-03 MED ORDER — CEFAZOLIN SODIUM-DEXTROSE 2-4 GM/100ML-% IV SOLN
INTRAVENOUS | Status: AC
  Filled 2020-09-03: qty 100

## 2020-09-03 MED ORDER — HYDROMORPHONE HCL 1 MG/ML IJ SOLN: 0.2500 mg | INTRAMUSCULAR | Status: DC | PRN

## 2020-09-03 MED ORDER — DEXAMETHASONE SODIUM PHOSPHATE 10 MG/ML IJ SOLN
INTRAMUSCULAR | Status: DC | PRN
  Administered 2020-09-03: 10 mg via INTRAVENOUS

## 2020-09-03 MED ORDER — SODIUM CHLORIDE (PF) 0.9 % IJ SOLN
INTRAMUSCULAR | Status: DC | PRN
  Administered 2020-09-03: 1000 mL

## 2020-09-03 MED ORDER — LIDOCAINE-EPINEPHRINE 1 %-1:100000 IJ SOLN
INTRAMUSCULAR | Status: AC
  Filled 2020-09-03: qty 1

## 2020-09-03 MED ORDER — ONDANSETRON HCL 4 MG/2ML IJ SOLN: 4.0000 mg | INTRAMUSCULAR | Status: DC | PRN

## 2020-09-03 MED ORDER — HYDROCODONE-ACETAMINOPHEN 5-325 MG PO TABS
1.0000 | ORAL_TABLET | ORAL | Status: DC | PRN
  Administered 2020-09-03 – 2020-09-04 (×2): 1 via ORAL
  Filled 2020-09-03 (×2): qty 1

## 2020-09-03 MED ORDER — DOCUSATE SODIUM 100 MG PO CAPS
100.0000 mg | ORAL_CAPSULE | Freq: Two times a day (BID) | ORAL | Status: DC
  Administered 2020-09-03 – 2020-09-10 (×14): 100 mg via ORAL
  Filled 2020-09-03 (×14): qty 1

## 2020-09-03 MED ORDER — DEXAMETHASONE 4 MG PO TABS
4.0000 mg | ORAL_TABLET | Freq: Three times a day (TID) | ORAL | Status: DC
  Administered 2020-09-05 – 2020-09-07 (×5): 4 mg via ORAL
  Filled 2020-09-03 (×5): qty 1

## 2020-09-03 MED ORDER — MIDAZOLAM HCL 2 MG/2ML IJ SOLN
INTRAMUSCULAR | Status: AC
  Filled 2020-09-03: qty 2

## 2020-09-03 MED ORDER — THROMBIN 20000 UNITS EX SOLR: CUTANEOUS | Status: DC | PRN

## 2020-09-03 MED ORDER — LISINOPRIL 10 MG PO TABS
10.0000 mg | ORAL_TABLET | Freq: Every day | ORAL | Status: DC
  Administered 2020-09-03 – 2020-09-10 (×8): 10 mg via ORAL
  Filled 2020-09-03 (×8): qty 1

## 2020-09-03 MED ORDER — DEXAMETHASONE 4 MG PO TABS
6.0000 mg | ORAL_TABLET | Freq: Four times a day (QID) | ORAL | Status: AC
  Administered 2020-09-03 – 2020-09-04 (×4): 6 mg via ORAL
  Filled 2020-09-03 (×4): qty 2

## 2020-09-03 MED ORDER — LEVETIRACETAM IN NACL 1000 MG/100ML IV SOLN
1000.0000 mg | INTRAVENOUS | Status: AC
  Administered 2020-09-03: 1000 mg via INTRAVENOUS
  Filled 2020-09-03: qty 100

## 2020-09-03 MED ORDER — CEFAZOLIN SODIUM-DEXTROSE 2-3 GM-%(50ML) IV SOLR
INTRAVENOUS | Status: DC | PRN
  Administered 2020-09-03: 2 g via INTRAVENOUS

## 2020-09-03 MED ORDER — LORAZEPAM 0.5 MG PO TABS: 0.2500 mg | ORAL_TABLET | Freq: Every day | ORAL | Status: DC | PRN

## 2020-09-03 MED ORDER — ROCURONIUM BROMIDE 10 MG/ML (PF) SYRINGE
PREFILLED_SYRINGE | INTRAVENOUS | Status: AC
  Filled 2020-09-03: qty 20

## 2020-09-03 MED ORDER — PROPOFOL 10 MG/ML IV BOLUS
INTRAVENOUS | Status: AC
  Filled 2020-09-03: qty 40

## 2020-09-03 MED ORDER — BUPIVACAINE HCL (PF) 0.5 % IJ SOLN
INTRAMUSCULAR | Status: AC
  Filled 2020-09-03: qty 30

## 2020-09-03 MED ORDER — HEPARIN SODIUM (PORCINE) 5000 UNIT/ML IJ SOLN
5000.0000 [IU] | Freq: Three times a day (TID) | INTRAMUSCULAR | Status: DC
  Administered 2020-09-04 – 2020-09-06 (×7): 5000 [IU] via SUBCUTANEOUS
  Filled 2020-09-03 (×7): qty 1

## 2020-09-03 MED ORDER — BUPIVACAINE HCL (PF) 0.5 % IJ SOLN
INTRAMUSCULAR | Status: DC | PRN
  Administered 2020-09-03: 5 mL

## 2020-09-03 MED ORDER — HYDROCHLOROTHIAZIDE 12.5 MG PO CAPS
12.5000 mg | ORAL_CAPSULE | Freq: Every day | ORAL | Status: DC
  Administered 2020-09-03 – 2020-09-10 (×8): 12.5 mg via ORAL
  Filled 2020-09-03 (×8): qty 1

## 2020-09-03 MED ORDER — MORPHINE SULFATE (PF) 2 MG/ML IV SOLN
1.0000 mg | INTRAVENOUS | Status: DC | PRN
Start: 2020-09-03 — End: 2020-09-05

## 2020-09-03 MED ORDER — SUGAMMADEX SODIUM 200 MG/2ML IV SOLN
INTRAVENOUS | Status: DC | PRN
  Administered 2020-09-03: 180 mg via INTRAVENOUS

## 2020-09-03 SURGICAL SUPPLY — 106 items
APL SKNCLS STERI-STRIP NONHPOA (GAUZE/BANDAGES/DRESSINGS)
BAND INSRT 18 STRL LF DISP RB (MISCELLANEOUS) ×4
BAND RUBBER #18 3X1/16 STRL (MISCELLANEOUS) ×2 IMPLANT
BENZOIN TINCTURE PRP APPL 2/3 (GAUZE/BANDAGES/DRESSINGS) IMPLANT
BLADE CLIPPER SURG (BLADE) ×3 IMPLANT
BLADE SAW GIGLI 16 STRL (MISCELLANEOUS) IMPLANT
BLADE SURG 15 STRL LF DISP TIS (BLADE) IMPLANT
BLADE SURG 15 STRL SS (BLADE)
BLADE ULTRA TIP 2M (BLADE) ×3 IMPLANT
BNDG CMPR 75X41 PLY ABS (GAUZE/BANDAGES/DRESSINGS) ×2
BNDG CMPR 75X41 PLY HI ABS (GAUZE/BANDAGES/DRESSINGS)
BNDG GAUZE ELAST 4 BULKY (GAUZE/BANDAGES/DRESSINGS) ×2 IMPLANT
BNDG STRETCH 4X75 NS LF (GAUZE/BANDAGES/DRESSINGS) ×1 IMPLANT
BNDG STRETCH 4X75 STRL LF (GAUZE/BANDAGES/DRESSINGS) IMPLANT
BUR ACORN 6.0 PRECISION (BURR) ×3 IMPLANT
BUR ROUND FLUTED 4 SOFT TCH (BURR) ×1 IMPLANT
BUR SPIRAL ROUTER 2.3 (BUR) ×3 IMPLANT
CANISTER SUCT 3000ML PPV (MISCELLANEOUS) ×6 IMPLANT
CARTRIDGE OIL MAESTRO DRILL (MISCELLANEOUS) ×2 IMPLANT
CATH VENTRIC 35X38 W/TROCAR LG (CATHETERS) IMPLANT
CLIP VESOCCLUDE MED 6/CT (CLIP) IMPLANT
CNTNR URN SCR LID CUP LEK RST (MISCELLANEOUS) ×2 IMPLANT
CONT SPEC 4OZ STRL OR WHT (MISCELLANEOUS) ×6
COVER BURR HOLE UNIV 10 (Orthopedic Implant) ×3 IMPLANT
COVER MAYO STAND STRL (DRAPES) IMPLANT
COVER WAND RF STERILE (DRAPES) ×2 IMPLANT
DECANTER SPIKE VIAL GLASS SM (MISCELLANEOUS) ×2 IMPLANT
DIFFUSER DRILL AIR PNEUMATIC (MISCELLANEOUS) ×3 IMPLANT
DRAIN SUBARACHNOID (WOUND CARE) IMPLANT
DRAPE HALF SHEET 40X57 (DRAPES) ×3 IMPLANT
DRAPE MICROSCOPE LEICA (MISCELLANEOUS) ×1 IMPLANT
DRAPE NEUROLOGICAL W/INCISE (DRAPES) ×3 IMPLANT
DRAPE STERI IOBAN 125X83 (DRAPES) IMPLANT
DRAPE SURG 17X23 STRL (DRAPES) IMPLANT
DRAPE WARM FLUID 44X44 (DRAPES) ×3 IMPLANT
DRSG ADAPTIC 3X8 NADH LF (GAUZE/BANDAGES/DRESSINGS) IMPLANT
DRSG TELFA 3X8 NADH (GAUZE/BANDAGES/DRESSINGS) ×3 IMPLANT
DURAPREP 6ML APPLICATOR 50/CS (WOUND CARE) ×3 IMPLANT
ELECT REM PT RETURN 9FT ADLT (ELECTROSURGICAL) ×3
ELECTRODE REM PT RTRN 9FT ADLT (ELECTROSURGICAL) ×2 IMPLANT
EVACUATOR 1/8 PVC DRAIN (DRAIN) IMPLANT
EVACUATOR SILICONE 100CC (DRAIN) IMPLANT
FORCEPS BIPOLAR SPETZLER 8 1.0 (NEUROSURGERY SUPPLIES) ×3 IMPLANT
GAUZE 4X4 16PLY RFD (DISPOSABLE) IMPLANT
GAUZE SPONGE 4X4 12PLY STRL (GAUZE/BANDAGES/DRESSINGS) ×3 IMPLANT
GLOVE BIO SURGEON STRL SZ7.5 (GLOVE) IMPLANT
GLOVE BIOGEL PI IND STRL 7.5 (GLOVE) ×4 IMPLANT
GLOVE BIOGEL PI INDICATOR 7.5 (GLOVE) ×2
GLOVE ECLIPSE 7.0 STRL STRAW (GLOVE) ×6 IMPLANT
GLOVE EXAM NITRILE XL STR (GLOVE) IMPLANT
GLOVE SURG UNDER POLY LF SZ7 (GLOVE) IMPLANT
GOWN STRL REUS W/ TWL LRG LVL3 (GOWN DISPOSABLE) ×4 IMPLANT
GOWN STRL REUS W/ TWL XL LVL3 (GOWN DISPOSABLE) IMPLANT
GOWN STRL REUS W/TWL 2XL LVL3 (GOWN DISPOSABLE) IMPLANT
GOWN STRL REUS W/TWL LRG LVL3 (GOWN DISPOSABLE) ×6
GOWN STRL REUS W/TWL XL LVL3 (GOWN DISPOSABLE) ×6
HEMOSTAT POWDER KIT SURGIFOAM (HEMOSTASIS) ×3 IMPLANT
HEMOSTAT POWDER SURGIFOAM 1G (HEMOSTASIS) IMPLANT
HEMOSTAT SURGICEL 2X14 (HEMOSTASIS) ×3 IMPLANT
HOOK DURA 1/2IN (MISCELLANEOUS) ×3 IMPLANT
IV NS 1000ML (IV SOLUTION) ×3
IV NS 1000ML BAXH (IV SOLUTION) ×2 IMPLANT
KIT BASIN OR (CUSTOM PROCEDURE TRAY) ×3 IMPLANT
KIT DRAIN CSF ACCUDRAIN (MISCELLANEOUS) IMPLANT
KIT TURNOVER KIT B (KITS) ×3 IMPLANT
KNIFE ARACHNOID DISP AM-24-S (MISCELLANEOUS) ×3 IMPLANT
MARKER SPHERE PSV REFLC 13MM (MARKER) ×6 IMPLANT
NDL SPNL 18GX3.5 QUINCKE PK (NEEDLE) IMPLANT
NEEDLE HYPO 22GX1.5 SAFETY (NEEDLE) ×3 IMPLANT
NEEDLE SPNL 18GX3.5 QUINCKE PK (NEEDLE) IMPLANT
NS IRRIG 1000ML POUR BTL (IV SOLUTION) ×8 IMPLANT
OIL CARTRIDGE MAESTRO DRILL (MISCELLANEOUS) ×3
PACK BATTERY CMF DISP FOR DVR (ORTHOPEDIC DISPOSABLE SUPPLIES) ×1 IMPLANT
PACK CRANIOTOMY CUSTOM (CUSTOM PROCEDURE TRAY) ×3 IMPLANT
PAD DRESSING TELFA 3X8 NADH (GAUZE/BANDAGES/DRESSINGS) IMPLANT
PATTIES SURGICAL .25X.25 (GAUZE/BANDAGES/DRESSINGS) IMPLANT
PATTIES SURGICAL .5 X.5 (GAUZE/BANDAGES/DRESSINGS) IMPLANT
PATTIES SURGICAL .5 X3 (DISPOSABLE) IMPLANT
PATTIES SURGICAL 1/4 X 3 (GAUZE/BANDAGES/DRESSINGS) IMPLANT
PATTIES SURGICAL 1X1 (DISPOSABLE) IMPLANT
PIN MAYFIELD SKULL DISP (PIN) ×3 IMPLANT
SCREW UNIII AXS SD 1.5X4 (Screw) ×7 IMPLANT
SET CARTRIDGE AND TUBING (SET/KITS/TRAYS/PACK) ×1 IMPLANT
SPECIMEN JAR SMALL (MISCELLANEOUS) IMPLANT
SPONGE NEURO XRAY DETECT 1X3 (DISPOSABLE) IMPLANT
SPONGE SURGIFOAM ABS GEL 100 (HEMOSTASIS) ×4 IMPLANT
STAPLER VISISTAT 35W (STAPLE) ×4 IMPLANT
STOCKINETTE 6  STRL (DRAPES) ×3
STOCKINETTE 6 STRL (DRAPES) IMPLANT
SUT ETHILON 3 0 FSL (SUTURE) IMPLANT
SUT ETHILON 3 0 PS 1 (SUTURE) IMPLANT
SUT NURALON 4 0 TR CR/8 (SUTURE) ×8 IMPLANT
SUT SILK 0 TIES 10X30 (SUTURE) IMPLANT
SUT VIC AB 0 CT1 18XCR BRD8 (SUTURE) ×4 IMPLANT
SUT VIC AB 0 CT1 8-18 (SUTURE) ×6
SUT VIC AB 3-0 SH 8-18 (SUTURE) ×6 IMPLANT
TAPE CLOTH 1X10 TAN NS (GAUZE/BANDAGES/DRESSINGS) ×2 IMPLANT
TIP STANDARD 36KHZ (INSTRUMENTS) ×3
TIP STD 36KHZ (INSTRUMENTS) IMPLANT
TOWEL GREEN STERILE (TOWEL DISPOSABLE) ×3 IMPLANT
TOWEL GREEN STERILE FF (TOWEL DISPOSABLE) ×3 IMPLANT
TRAY FOLEY MTR SLVR 16FR STAT (SET/KITS/TRAYS/PACK) ×3 IMPLANT
TUBE CONNECTING 12X1/4 (SUCTIONS) ×3 IMPLANT
UNDERPAD 30X36 HEAVY ABSORB (UNDERPADS AND DIAPERS) ×3 IMPLANT
WATER STERILE IRR 1000ML POUR (IV SOLUTION) ×3 IMPLANT
WRENCH TORQUE 36KHZ (INSTRUMENTS) ×1 IMPLANT

## 2020-09-03 NOTE — Anesthesia Procedure Notes (Signed)
Procedure Name: Intubation Date/Time: 09/03/2020 12:25 PM Performed by: Glynda Jaeger, CRNA Pre-anesthesia Checklist: Patient identified, Patient being monitored, Timeout performed, Emergency Drugs available and Suction available Patient Re-evaluated:Patient Re-evaluated prior to induction Oxygen Delivery Method: Circle System Utilized Preoxygenation: Pre-oxygenation with 100% oxygen Induction Type: IV induction Ventilation: Mask ventilation without difficulty Laryngoscope Size: Mac and 4 Grade View: Grade I Tube type: Oral Tube size: 7.5 mm Number of attempts: 1 Airway Equipment and Method: Stylet Placement Confirmation: ETT inserted through vocal cords under direct vision,  positive ETCO2 and breath sounds checked- equal and bilateral Secured at: 21 cm Tube secured with: Tape Dental Injury: Teeth and Oropharynx as per pre-operative assessment

## 2020-09-03 NOTE — Anesthesia Postprocedure Evaluation (Signed)
Anesthesia Post Note  Patient: Gabriel [name]  Procedure(s) Performed: Stereotactic right temporal craniotomy for resection of tumor (Right Head) APPLICATION OF CRANIAL NAVIGATION (N/A )     Patient location during evaluation: PACU Anesthesia Type: General Level of consciousness: awake and alert Pain management: pain level controlled Vital Signs Assessment: post-procedure vital signs reviewed and stable Respiratory status: spontaneous breathing, nonlabored ventilation, respiratory function stable and patient connected to nasal cannula oxygen Cardiovascular status: blood pressure returned to baseline and stable Postop Assessment: no apparent nausea or vomiting Anesthetic complications: no   No complications documented.  Last Vitals:  Vitals:   09/03/20 1615 09/03/20 1700  BP: 130/82 139/85  Pulse: 70 66  Resp: 11 18  Temp:    SpO2: 98% 98%    Last Pain:  Vitals:   09/03/20 1615  TempSrc:   PainSc: 2                  Allecia Bells S

## 2020-09-03 NOTE — H&P (Signed)
Chief Complaint   Brain tumor  History of Present Illness  Gabriel [name] is a 87 y.o. male with recently diagnosed stage IV melanoma.  This included MRI of the brain which demonstrated 3 intracranial metastases including 2 small subcentimeter meter metastases and a larger right hemorrhagic lesion.  His case was discussed at the multidisciplinary neuro-oncology conference where consensus opinion was to undergo primary treatment of the smaller mets and preoperative SRS to the larger right temporal metastasis.  The patient was seen in the outpatient neurosurgery clinic where the risks, benefits, and alternatives to surgical resection were all reviewed in detail with the patient and his wife.  He presents today for surgery having undergone preoperative radiosurgery this past Friday.  He has been off his Plavix for the last 5 days.  Past Medical History   Past Medical History:  Diagnosis Date  . Cancer ([place])    melonoma on stomach, lung, brain  . Gait disorder 02/04/2017   cane   . High cholesterol   . Hypertension   . Pneumonia   . Stroke ([place]) 04/2019 last one   gait unsteady- . uses a cane,2-3 mini strokes    Past Surgical History   Past Surgical History:  Procedure Laterality Date  . BRONCHIAL BIOPSY  08/20/2020   Procedure: BRONCHIAL BIOPSIES;  Surgeon: Garner Nash, DO;  Location: [facility];  Service: Pulmonary;;  . BRONCHIAL BRUSHINGS  08/20/2020   Procedure: BRONCHIAL BRUSHINGS;  Surgeon: Garner Nash, DO;  Location: [facility];  Service: Pulmonary;;  . BRONCHIAL NEEDLE ASPIRATION BIOPSY  08/20/2020   Procedure: BRONCHIAL NEEDLE ASPIRATION BIOPSIES;  Surgeon: Garner Nash, DO;  Location: [facility];  Service: Pulmonary;;  . BRONCHIAL WASHINGS  08/20/2020   Procedure: BRONCHIAL WASHINGS;  Surgeon: Garner Nash, DO;  Location: [facility];  Service: Pulmonary;;  . HEMOSTASIS CONTROL  08/20/2020   Procedure: HEMOSTASIS CONTROL;  Surgeon: Garner Nash, DO;   Location: [facility];  Service: Pulmonary;;  . VIDEO BRONCHOSCOPY WITH ENDOBRONCHIAL NAVIGATION Left 08/20/2020   Procedure: VIDEO BRONCHOSCOPY WITH ENDOBRONCHIAL NAVIGATION;  Surgeon: Garner Nash, DO;  Location: [facility];  Service: Pulmonary;  Laterality: Left;  Marland Kitchen VIDEO BRONCHOSCOPY WITH ENDOBRONCHIAL ULTRASOUND Left 08/20/2020   Procedure: VIDEO BRONCHOSCOPY WITH ENDOBRONCHIAL ULTRASOUND;  Surgeon: Garner Nash, DO;  Location: [facility];  Service: Pulmonary;  Laterality: Left;    Social History   Social History   Tobacco Use  . Smoking status: Never Smoker  . Smokeless tobacco: Never Used  Vaping Use  . Vaping Use: Never used  Substance Use Topics  . Alcohol use: Yes    Alcohol/week: 1.0 standard drink    Types: 1 Cans of beer per week    Comment: 1 beer per week  . Drug use: No    Medications   Prior to Admission medications   Medication Sig Start Date End Date Taking? Authorizing Provider  atorvastatin (LIPITOR) 40 MG tablet Take 1 tablet (40 mg total) by mouth daily. 04/15/19  Yes Oretha Milch D, MD  dexamethasone (DECADRON) 4 MG tablet Take 1 tablet (4 mg total) by mouth 4 (four) times daily. Patient taking differently: Take 4 mg by mouth 4 (four) times daily. Takes 3x a day 08/16/20  Yes Mohamed, Julien Nordmann, MD  hydrochlorothiazide (MICROZIDE) 12.5 MG capsule Take 12.5 mg by mouth daily. 04/04/19  Yes [provider]  HYDROcodone-acetaminophen (NORCO/VICODIN) 5-325 MG tablet Take 1 tablet by mouth daily as needed for moderate pain or severe pain. 07/16/20  Yes [provider]  lisinopril (PRINIVIL,ZESTRIL) 10 MG tablet Take 10 mg by mouth daily. 11/20/16  Yes [provider]  LORazepam (ATIVAN) 0.5 MG tablet Take 0.5 tablets (0.25 mg total) by mouth as directed. 1/2 tab po 30 minutes prior to radiation or MRI scans 08/22/20  Yes Hayden Pedro, PA-C  clopidogrel (PLAVIX) 75 MG tablet Take 1 tablet (75 mg total) by mouth daily.  08/24/19   Garvin Fila, MD    Allergies  No Known Allergies  Review of Systems  ROS  Neurologic Exam  Awake, alert, oriented Memory and concentration grossly intact Speech fluent, appropriate CN grossly intact Motor exam: Upper Extremities Deltoid Bicep Tricep Grip  Right 5/5 5/5 5/5 5/5  Left 5/5 5/5 5/5 5/5   Lower Extremities IP Quad PF DF EHL  Right 5/5 5/5 5/5 5/5 5/5  Left 5/5 5/5 5/5 5/5 5/5   Sensation grossly intact to LT  Imaging  MRI of the brain was personally reviewed demonstrating a peripherally enhancing hemorrhagic metastasis in the superior right temporal region.  There is associated local mass-effect.  Lesion measures approximately 4.5 cm in maximal dimension.  Impression  - 87 y.o. male with stage IV metastatic melanoma including a 4.5 cm right temporal metastasis.  He has undergone preoperative stereotactic radiosurgery  Plan  -We will plan on proceeding with stereotactic right temporal craniotomy for resection of tumor  I have reviewed the indications for surgery as well as the risks, benefits, and alternatives as well as the expected postoperative course and recovery with the patient and his wife.  All their questions today were answered.  He provided informed consent to proceed.   [name], MD [facility]

## 2020-09-03 NOTE — Anesthesia Preprocedure Evaluation (Signed)
Anesthesia Evaluation  Patient identified by MRN, date of birth, ID band Patient awake    Reviewed: Allergy & Precautions, NPO status , Patient's Chart, lab work & pertinent test results  Airway Mallampati: II  TM Distance: >3 FB Neck ROM: Full    Dental no notable dental hx.    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, Residual Symptoms negative psych ROS   GI/Hepatic negative GI ROS, Neg liver ROS,   Endo/Other  negative endocrine ROS  Renal/GU negative Renal ROS  negative genitourinary   Musculoskeletal negative musculoskeletal ROS (+)   Abdominal   Peds negative pediatric ROS (+)  Hematology negative hematology ROS (+)   Anesthesia Other Findings   Reproductive/Obstetrics negative OB ROS                             Anesthesia Physical Anesthesia Plan  ASA: III  Anesthesia Plan: General   Post-op Pain Management:    Induction: Intravenous  PONV Risk Score and Plan: 2 and Ondansetron, Dexamethasone and Treatment may vary due to age or medical condition  Airway Management Planned: Oral ETT  Additional Equipment: Arterial line  Intra-op Plan:   Post-operative Plan: Possible Post-op intubation/ventilation  Informed Consent: I have reviewed the patients History and Physical, chart, labs and discussed the procedure including the risks, benefits and alternatives for the proposed anesthesia with the patient or authorized representative who has indicated his/her understanding and acceptance.     Dental advisory given  Plan Discussed with: CRNA and Surgeon  Anesthesia Plan Comments:         Anesthesia Quick Evaluation

## 2020-09-03 NOTE — Transfer of Care (Signed)
Immediate Anesthesia Transfer of Care Note  Patient: Gabriel [name]  Procedure(s) Performed: Stereotactic right temporal craniotomy for resection of tumor (Right Head) APPLICATION OF CRANIAL NAVIGATION (N/A )  Patient Location: PACU  Anesthesia Type:General  Level of Consciousness: drowsy  Airway & Oxygen Therapy: Patient Spontanous Breathing and Patient connected to face mask oxygen  Post-op Assessment: Report given to RN and Post -op Vital signs reviewed and stable  Post vital signs: Reviewed and stable  Last Vitals:  Vitals Value Taken Time  BP 127/73 09/03/20 1504  Temp    Pulse 73 09/03/20 1510  Resp 17 09/03/20 1510  SpO2 100 % 09/03/20 1510  Vitals shown include unvalidated device data.  Last Pain:  Vitals:   09/03/20 0942  TempSrc:   PainSc: 0-No pain      Patients Stated Pain Goal: 3 (53/96/72 8979)  Complications: No complications documented.

## 2020-09-03 NOTE — Anesthesia Procedure Notes (Signed)
Arterial Line Insertion Start/End5/31/2022 10:10 AM, 09/03/2020 10:14 AM Performed by: Glynda Jaeger, CRNA, CRNA  Preanesthetic checklist: patient identified, IV checked, site marked, risks and benefits discussed, surgical consent, monitors and equipment checked, pre-op evaluation, timeout performed and anesthesia consent Lidocaine 1% used for infiltration Left, radial was placed Catheter size: 20 G Hand hygiene performed  and maximum sterile barriers used  Allen's test indicative of satisfactory collateral circulation Attempts: 1 Procedure performed without using ultrasound guided technique. Following insertion, dressing applied and Biopatch. Post procedure assessment: normal  Patient tolerated the procedure well with no immediate complications.

## 2020-09-04 ENCOUNTER — Ambulatory Visit: Payer: Medicare Other

## 2020-09-04 ENCOUNTER — Inpatient Hospital Stay (HOSPITAL_COMMUNITY): Payer: Medicare Other

## 2020-09-04 LAB — SURGICAL PATHOLOGY

## 2020-09-04 IMAGING — MR MR HEAD WO/W CM
14 of 16 series · 42 of 48 positions shown · IV contrast (Gadavist)
Comparison: Brain MRI [DATE]

CLINICAL DATA: Status post craniotomy for tumor resection.

EXAM:
MRI HEAD WITHOUT AND WITH CONTRAST
TECHNIQUE: Multiplanar, multiecho pulse sequences of the brain and surrounding
structures were obtained without and with intravenous contrast.
CONTRAST:  9mL GADAVIST GADOBUTROL 1 MMOL/ML IV SOLN

[Series 5: DWI · axial · 3.0mm · 0.88mm/px · z∈[-88,+76]mm · 8 of 112 slices shown (1 of 4)]
[im 1/112]
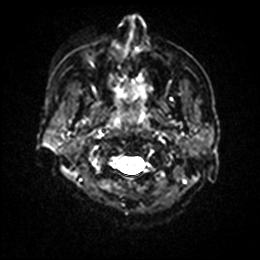
[im 16/112]
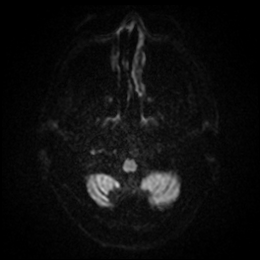
[im 32/112]
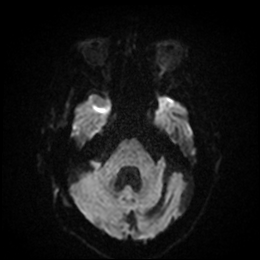
[im 48/112]
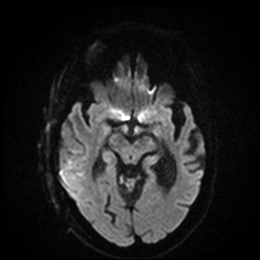
[im 64/112]
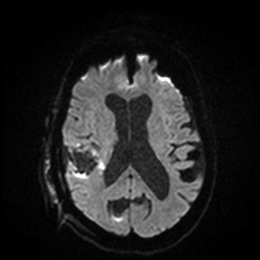
[im 80/112]
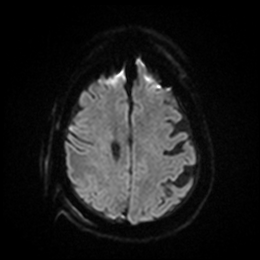
[im 96/112]
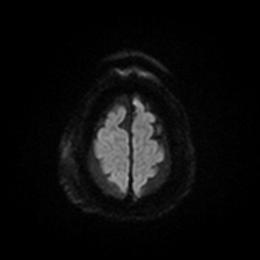
[im 112/112]
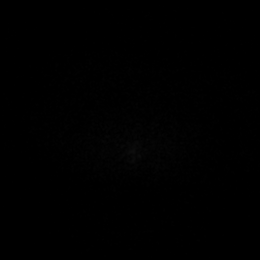

[Series 6: DWI · axial · 3.0mm · 0.88mm/px · z∈[-88,+76]mm · 3 of 54 slices shown (2 of 4)]
[im 1/54]
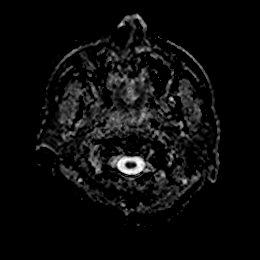
[im 27/54]
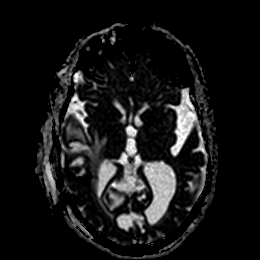
[im 54/54]
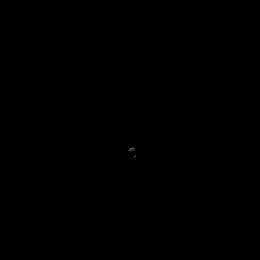

[Series 7: DWI · coronal · 4.0mm · 0.88mm/px · 5 of 82 slices shown (3 of 4)]
[im 1/82]
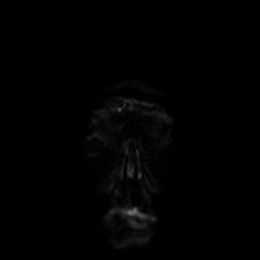
[im 21/82]
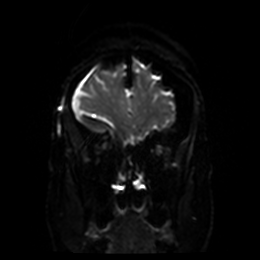
[im 41/82]
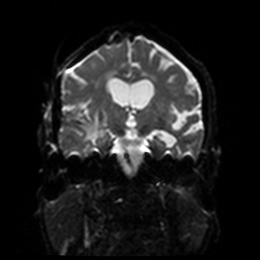
[im 61/82]
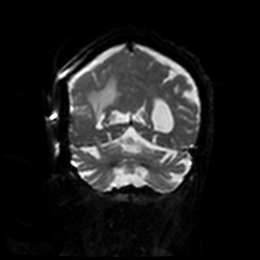
[im 82/82]
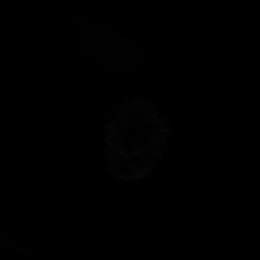

[Series 8: DWI · coronal · 4.0mm · 0.88mm/px · 2 of 41 slices shown (4 of 4)]
[im 1/41]
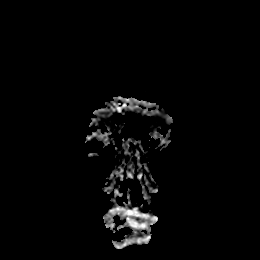
[im 41/41]
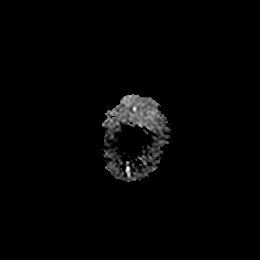

[Series 9: T1 · sagittal · 5.0mm · 0.75mm/px · 2 of 26 slices shown]
[im 1/26]
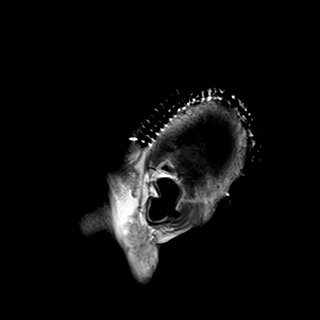
[im 26/26]
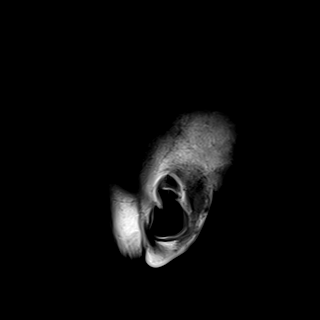

[Series 10: T2 · axial · 5.0mm · 0.72mm/px · z∈[-87,+75]mm · 2 of 28 slices shown]
[im 1/28]
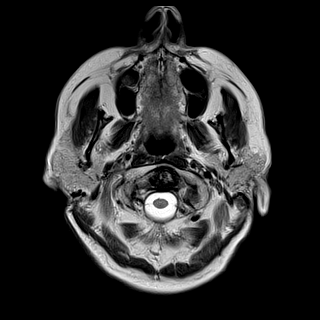
[im 28/28]
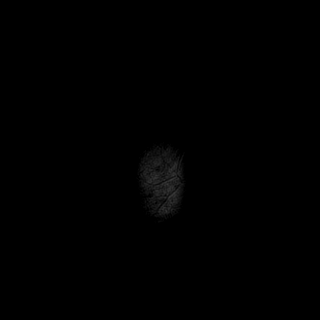

[Series 11: FLAIR · axial · 5.0mm · 0.45mm/px · z∈[-85,+76]mm · 2 of 28 slices shown]
[im 1/28]
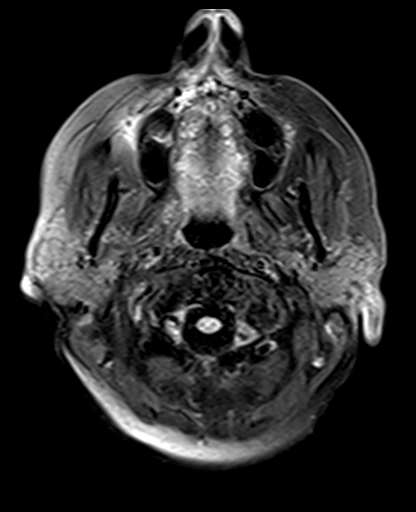
[im 28/28]
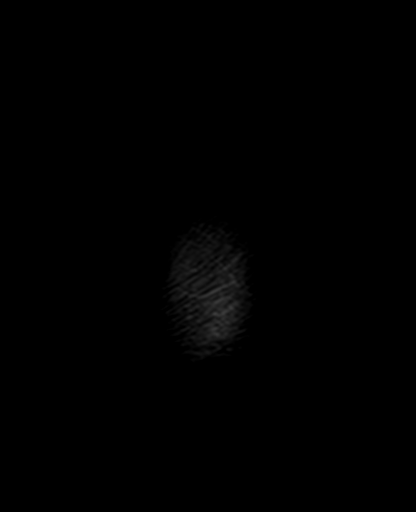

[Series 12: mag_images · axial · 3.0mm · 0.90mm/px · z∈[-87,+78]mm · 3 of 56 slices shown]
[im 1/56]
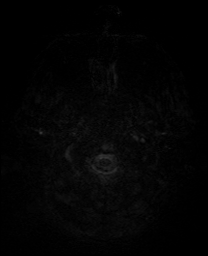
[im 28/56]
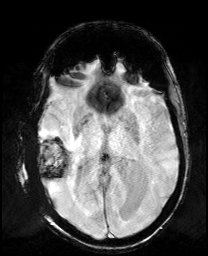
[im 56/56]
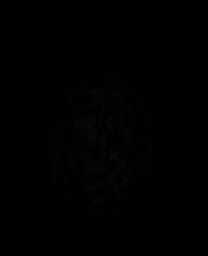

[Series 13: pha_images · axial · 3.0mm · 0.90mm/px · z∈[-87,+75]mm · 3 of 55 slices shown]
[im 1/55]
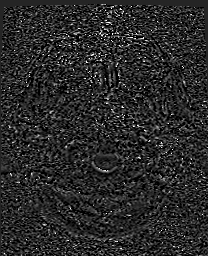
[im 28/55]
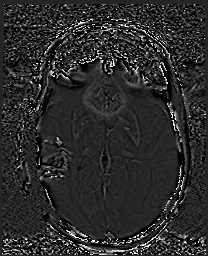
[im 55/55]
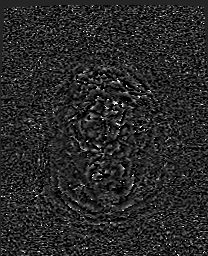

[Series 14: swi_images · axial · 3.0mm · 0.90mm/px · z∈[-87,+78]mm · 3 of 56 slices shown]
[im 1/56]
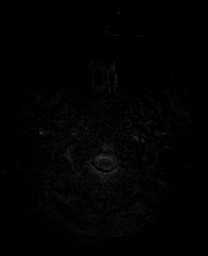
[im 28/56]
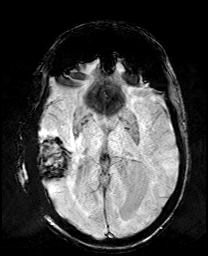
[im 56/56]
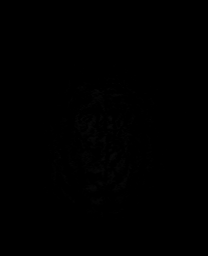

[Series 15: mip_images(sw) · axial · 24.0mm · 0.90mm/px · z∈[-76,+67]mm · 3 of 49 slices shown]
[im 1/49]
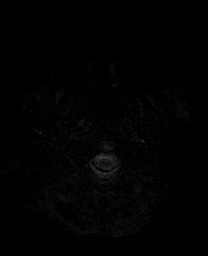
[im 25/49]
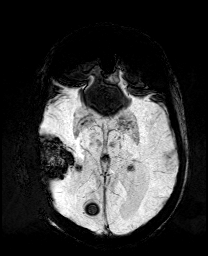
[im 49/49]
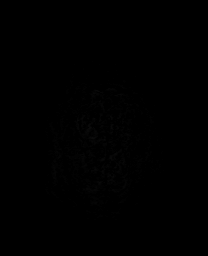

[Series 17: T2 post-contrast · coronal · 5.0mm · 0.72mm/px · 2 of 36 slices shown]
[im 1/36]
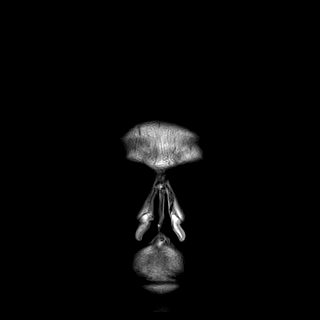
[im 36/36]
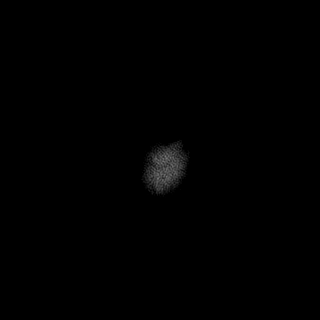

[Series 19: T1 post-contrast · coronal · 5.0mm · 0.34mm/px · 2 of 36 slices shown (1 of 2)]
[im 1/36]
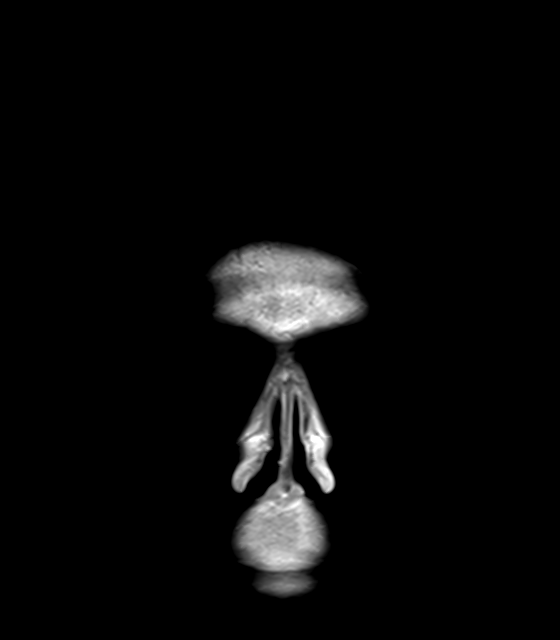
[im 36/36]
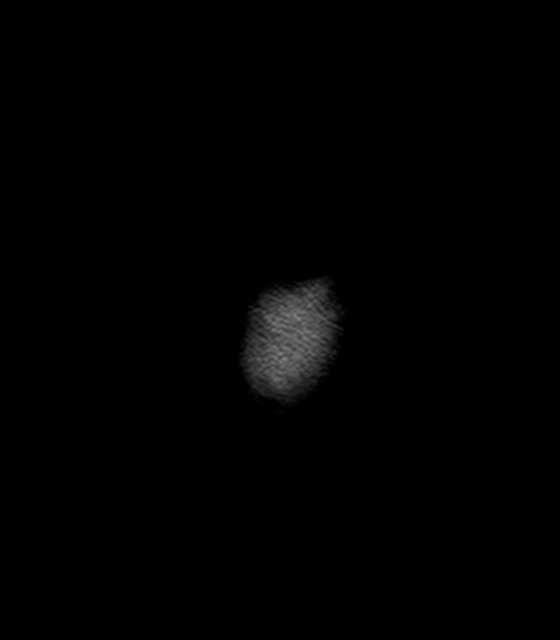

[Series 20: T1 post-contrast · sagittal · 5.0mm · 0.72mm/px · 2 of 26 slices shown (2 of 2)]
[im 1/26]
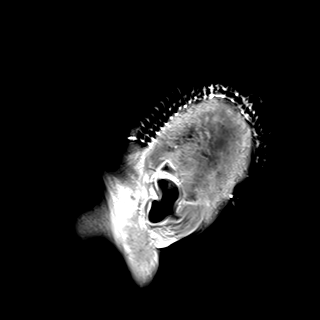
[im 26/26]
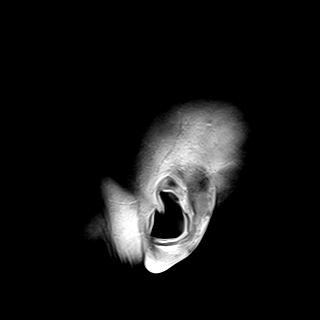

[42 of 48 positions shown; findings below may reference images not displayed]

FINDINGS: Brain: Status post resection of lesion centered in the right
temporal lobe. Contrast enhancement at the posterior aspect of the
resection cavity appears to be vascular. No nodular enhancement.
There is moderate pneumocephalus. 7 mm lesion at the superior right
frontal lobe is unchanged. There is mild diffusion restriction at
the posterior margin of the resection cavity. Mild surrounding
edema. There is generalized volume loss with ex vacuo dilatation of
the ventricles. There is multifocal periventricular white matter
hyperintensity, most often a result of chronic microvascular
ischemia. Expected blood products surrounding the resection cavity.

Vascular: Normal flow voids.

Skull and upper cervical spine: Status post right craniotomy.

Sinuses/Orbits: Negative

Other: None
IMPRESSION: 1. Status post resection of right temporal lobe mass without
evidence of residual tumor. This will serve as a baseline for future
studies.
2. Small amount of diffusion restriction at the posterior aspect of
the resection cavity, likely a small area of ischemia. This site may
enhance on future studies.
3. Moderate pneumocephalus.
4. Unchanged 7 mm lesion at the superior right frontal lobe.

## 2020-09-04 MED ORDER — GADOBUTROL 1 MMOL/ML IV SOLN
9.0000 mL | Freq: Once | INTRAVENOUS | Status: AC | PRN
  Administered 2020-09-04: 9 mL via INTRAVENOUS

## 2020-09-04 NOTE — Progress Notes (Signed)
  NEUROSURGERY PROGRESS NOTE   Pt seen and examined. No issues overnight. Minimal c/o HA.   EXAM: Temp:  [97.4 F (36.3 C)-98.4 F (36.9 C)] 98.4 F (36.9 C) (06/01 1600) Pulse Rate:  [58-73] 71 (06/01 1700) Resp:  [10-25] 18 (06/01 1700) BP: (99-185)/(56-102) 105/57 (06/01 1700) SpO2:  [93 %-98 %] 95 % (06/01 1700) Arterial Line BP: (117-187)/(52-93) 187/93 (06/01 0600) Intake/Output      05/31 0701 06/01 0700 06/01 0701 06/02 0700   I.V. (mL/kg) 1853.8 (21.3) 803.5 (9.2)   IV Piggyback 549.8 100   Total Intake(mL/kg) 2403.7 (27.6) 903.5 (10.4)   Urine (mL/kg/hr) 2100 550 (0.5)   Blood 100    Total Output 2200 550   Net +203.7 +353.5         Awake, alert, oriented Speech fluent, appropriate CN grossly intact MAE good strength  Dressing c/d/i  LABS: Lab Results  Component Value Date   CREATININE 0.95 09/03/2020   BUN 29 (H) 09/03/2020   NA 136 09/03/2020   K 4.4 09/03/2020   CL 102 09/03/2020   CO2 23 09/03/2020   Lab Results  Component Value Date   WBC 7.4 09/03/2020   HGB 10.9 (L) 09/03/2020   HCT 32.0 (L) 09/03/2020   MCV 89.7 09/03/2020   PLT 128 (L) 09/03/2020    IMAGING: Postop MRI reviewed showing GTR, expected mild diffusion restriction in peri-lesional white matter.  IMPRESSION: - 87 y.o. male POD#1 s/p right temporal crani for tumor resection, at neurologic baseline  PLAN: - Mobilize today with PT/OT - can d/c A-line - Cont Keppra/dex - GI/DVT prophylaxis   [name], MD [facility]

## 2020-09-04 NOTE — Evaluation (Signed)
Physical Therapy Evaluation Patient Details Name: Gabriel [name] MRN: [id number] DOB: May 28, 1930 Today's Date: 09/04/2020   History of Present Illness  87 yo male s/p right temporal craniotomy for resection of 4.5 cm R temporal tumor on 5/31 to address intracranial metastases, pt with stage IV melanoma. 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  Clinical Impression   Pt presents with impaired strength, impaired safety awareness and insight into deficits, poor balance, impaired gait, and decreased activity tolerance vs baseline. Pt to benefit from acute PT to address deficits. Pt ambulated short room distance with RW, overall requiring min-mod physical assist for mobility at this time. Pt lacks insight into deficits, is impulsive, and is a high fall risk at this time. PT recommending CIR post-acutely to return to independence. PT to progress mobility as tolerated, and will continue to follow acutely.      Follow Up Recommendations CIR;Supervision for mobility/OOB    Equipment Recommendations  None recommended by PT    Recommendations for Other Services       Precautions / Restrictions Precautions Precautions: Fall;Other (comment) Precaution Comments: crani Restrictions Weight Bearing Restrictions: No      Mobility  Bed Mobility Overal bed mobility: Needs Assistance Bed Mobility: Supine to Sit     Supine to sit: Mod assist;HOB elevated     General bed mobility comments: Mod assist for trunk elevation and scooting to EOB via lateral leaning. Very increased time, cues for sequencing task as pt terminates task prematurely.    Transfers Overall transfer level: Needs assistance Equipment used: Rolling walker (2 wheeled) Transfers: Sit to/from Stand Sit to Stand: Min assist         General transfer comment: min assist for power up and steadying upon standing, VC for hand placement when rising/sitting. STS x2, from EOB and  toilet.  Ambulation/Gait Ambulation/Gait assistance: Min assist;+2 safety/equipment Gait Distance (Feet): 10 Feet (x2) Assistive device: Rolling walker (2 wheeled) Gait Pattern/deviations: Step-through pattern;Decreased stride length;Decreased dorsiflexion - left;Drifts right/left;Trunk flexed;Shuffle Gait velocity: decr   General Gait Details: Min assist to steady, guide lines/leads, at times physically manage RW and pt. VC for upright posture, placement in RW multiple times, avoiding obstacles in room.  Stairs            Wheelchair Mobility    Modified Rankin (Stroke Patients Only) Modified Rankin (Stroke Patients Only) Pre-Morbid Rankin Score: Slight disability Modified Rankin: Moderately severe disability     Balance Overall balance assessment: Needs assistance Sitting-balance support: No upper extremity supported;Feet supported Sitting balance-Leahy Scale: Fair     Standing balance support: Bilateral upper extremity supported;During functional activity Standing balance-Leahy Scale: Poor Standing balance comment: heavily reliant on RW                             Pertinent Vitals/Pain Pain Assessment: Faces Faces Pain Scale: Hurts a little bit Pain Location: generalized during mobility Pain Descriptors / Indicators: Grimacing;Discomfort Pain Intervention(s): Limited activity within patient's tolerance;Monitored during session;Repositioned    Home Living Family/patient expects to be discharged to:: Private residence Living Arrangements: Spouse/significant other Available Help at Discharge: Family;Available 24 hours/day Type of Home: House Home Access: Stairs to enter   [employer] of Steps: 2 Home Layout: One level Home Equipment: Cane - single point      Prior Function Level of Independence: Independent with assistive device(s)         Comments: pt reports using cane for ambulation  PTA, pt reports his wife does most of the  driving.     Hand Dominance   Dominant Hand: Right    Extremity/Trunk Assessment   Upper Extremity Assessment Upper Extremity Assessment: Defer to OT evaluation    Lower Extremity Assessment Lower Extremity Assessment: Generalized weakness;Difficult to assess due to impaired cognition (difficulty attending to MMT movements, distractible)    Cervical / Trunk Assessment Cervical / Trunk Assessment: Normal  Communication   Communication: No difficulties  Cognition Arousal/Alertness: Awake/alert Behavior During Therapy: Flat affect;Impulsive Overall Cognitive Status: Impaired/Different from baseline Area of Impairment: Attention;Memory;Following commands;Safety/judgement;Awareness;Problem solving                   Current Attention Level: Selective Memory: Decreased recall of precautions Following Commands: Follows one step commands with increased time Safety/Judgement: Decreased awareness of safety;Decreased awareness of deficits Awareness: Emergent Problem Solving: Difficulty sequencing;Requires verbal cues;Requires tactile cues General Comments: Pt irritable upon PT arrival to room, states "I've been waiting and waiting to get up, I am going to have to have surgery to fix my bowels now". Pt with very little insight into deficits and spatial awareness during mobility, requires frequent safety cues with RW and room navigation. Pt with flat affect, per pt's wife this is new vs baseline.      General Comments General comments (skin integrity, edema, etc.): VSS    Exercises     Assessment/Plan    PT Assessment Patient needs continued PT services  PT Problem List Decreased strength;Decreased mobility;Decreased activity tolerance;Decreased balance;Decreased knowledge of use of DME;Pain;Decreased safety awareness;Decreased cognition;Decreased knowledge of precautions       PT Treatment Interventions DME instruction;Therapeutic activities;Gait training;Therapeutic  exercise;Patient/family education;Balance training;Stair training;Functional mobility training;Neuromuscular re-education    PT Goals (Current goals can be found in the Care Plan section)  Acute Rehab PT Goals Patient Stated Goal: get back to independence PT Goal Formulation: With patient Time For Goal Achievement: 09/18/20 Potential to Achieve Goals: Good    Frequency Min 4X/week   Barriers to discharge        Co-evaluation               AM-PAC PT "6 Clicks" Mobility  Outcome Measure Help needed turning from your back to your side while in a flat bed without using bedrails?: A Little Help needed moving from lying on your back to sitting on the side of a flat bed without using bedrails?: A Lot Help needed moving to and from a bed to a chair (including a wheelchair)?: A Lot Help needed standing up from a chair using your arms (e.g., wheelchair or bedside chair)?: A Little Help needed to walk in hospital room?: A Little Help needed climbing 3-5 steps with a railing? : A Lot 6 Click Score: 15    End of Session Equipment Utilized During Treatment: Gait belt Activity Tolerance: Patient tolerated treatment well;Patient limited by fatigue Patient left: in chair;with call bell/phone within reach;with chair alarm set;with family/visitor present Nurse Communication: Mobility status PT Visit Diagnosis: Other abnormalities of gait and mobility (R26.89);Muscle weakness (generalized) (M62.81)    Time: 1000-1037 PT Time Calculation (min) (ACUTE ONLY): 37 min   Charges:   PT Evaluation $PT Eval Low Complexity: 1 Low PT Treatments $Gait Training: 8-22 mins       Stacie Glaze, PT DPT Acute Rehabilitation Services Pager [phone]  Office [phone]  [place] 09/04/2020, 11:21 AM

## 2020-09-04 NOTE — Progress Notes (Signed)
Rehab Admissions Coordinator Note:  Patient was screened by Cleatrice Burke for appropriateness for an Inpatient Acute Rehab Consult per therapy recs.   At this time, we are recommending Inpatient Rehab consult. I will place order per protocol.  Cleatrice Burke RN MSN 09/04/2020, 11:33 AM  I can be reached at [phone].

## 2020-09-05 ENCOUNTER — Ambulatory Visit
Admission: RE | Admit: 2020-09-05 | Discharge: 2020-09-05 | Disposition: A | Discharge status: Home / Self Care | Payer: Medicare Other | Source: Ambulatory Visit | Attending: Radiation Oncology | Admitting: Radiation Oncology

## 2020-09-05 ENCOUNTER — Encounter (HOSPITAL_COMMUNITY): Payer: Self-pay | Admitting: Neurosurgery

## 2020-09-05 DIAGNOSIS — C7931 Secondary malignant neoplasm of brain: Secondary | ICD-10-CM | POA: Insufficient documentation

## 2020-09-05 DIAGNOSIS — Z510 Encounter for antineoplastic radiation therapy: Secondary | ICD-10-CM | POA: Insufficient documentation

## 2020-09-05 DIAGNOSIS — C3412 Malignant neoplasm of upper lobe, left bronchus or lung: Secondary | ICD-10-CM | POA: Insufficient documentation

## 2020-09-05 NOTE — Progress Notes (Signed)
Per Dr. Kathyrn Sheriff, patient may continue with scheduled radiation therapy at [facility] today.

## 2020-09-05 NOTE — Op Note (Signed)
NEUROSURGERY OPERATIVE NOTE   PREOP DIAGNOSIS:  1. Metastatic brain tumor   POSTOP DIAGNOSIS: Same  PROCEDURE: 1. Stereotactic right temporal craniotomy for resection of brain metastasis 2. Use of intraoperative microscope for microdissection  SURGEON: Dr. [name], MD  ASSISTANT: Dr. Elwin Sleight, MD  ANESTHESIA: General Endotracheal  EBL: 100cc  SPECIMENS: Right temporal tumor for permanent pathology  DRAINS: None  COMPLICATIONS: None immediate  CONDITION: Hemodynamically stable to PACU  HISTORY: Gabriel [name] is a 87 y.o. male recently diagnosed with stage IV metastatic melanoma.  His work-up included MRI scan of the brain which demonstrated 2 small frontal metastases and a much larger hemorrhagic right temporal metastasis.  Patient was an excellent functional status prior to his diagnosis.  His case was discussed at the multidisciplinary neuro-oncology conference where the consensus opinion was to treat the larger metastasis with preoperative stereotactic radiosurgery followed by surgical resection.  Patient was seen in the outpatient neurosurgery clinic where treatment options were discussed including the recommendation for surgical resection.  Risks, benefits, and alternatives to surgery were reviewed in detail with the patient and his wife.  After all their questions were answered informed consent was obtained and witnessed.  Patient has previously undergone preoperative stereotactic radiosurgery 4 days prior.  PROCEDURE IN DETAIL: The patient was brought to the operating room. After induction of general anesthesia, the patient was positioned on the operative table in the [place] head holder in the supine position. All pressure points were meticulously padded.  The stereotactic MRI scan done preoperatively was used to call register with surface markers until a satisfactory accuracy was achieved.  Stereotactic system was then used to identify the surface projection  of the right temporal tumor and a curvilinear skin incision was planned out then marked out and prepped and draped in the usual sterile fashion.  After timeout was conducted, the incision was infiltrated with local anesthetic with epinephrine.  Incision was then made sharply and carried down through the galea.  Raney clips were applied for hemostasis on the skin edges.  The temporalis muscle and fascia was then incised with the Bovie and elevated and reflected inferiorly.  Stereotactic system was then used to plan out bur holes which would allow access to the entirety of the tumor while staying just superior to the transverse sinus.  Bur holes were then connected with the craniotome and a single piece temporal craniotomy flap was elevated.  Hemostasis on the epidural plane was secured with bipolar electrocautery.  Dura was then opened in curvilinear fashion based inferiorly and tacked up with 4-0 Nurolon stitches.  The tumor was immediately identified, dark purple in color, coming to the surface of the temporal lobe.  At this point the microscope was draped sterilely and brought into the field and the remainder of the case was done under the microscope using microdissection technique.  Initially, the interface between the tumor and the surrounding brain was coagulated with the bipolar.  The pia was then cut with microscissors.  Utilizing the bipolar electrocautery to cauterize the tumor capsule and the ultrasonic aspirator set at very low settings, I was able to dissect the tumor circumferentially from the surrounding gliotic white matter.  This was done in all planes until the deep margin of the tumor was identified.  The tumor was then dissected free away from the deep white matter and the tumor was removed en bloc and sent for permanent pathology.  The resection cavity was then inspected and a small amount of residual  tumor was removed from the anterior margin.  No further tumor was visually  identified.  Hemostasis was then easily secured on the resection bed using a combination of bipolar electrocautery and morselized Gelfoam with thrombin.  The resection cavity was then irrigated with a copious amount of normal saline irrigation and no further bleeding was identified.  The portion of the dura which was mildly adherent to the tumor was then coagulated with the bipolar.  The dura was then reapproximated with 4-0 Nurolon stitches.  A large sheet of Gelfoam was then placed over the dura and the bone flap was replaced with standard titanium plates and screws.  The skin was then again irrigated with normal saline.  The galea and temporalis fascia were then opposed with interrupted 0 and 3-0 Vicryl stitches.  The skin was closed with staples.  Bacitracin ointment and sterile dressing with head wrap was then applied after the patient was removed from the [place] head holder.  At the end of the case all sponge, needle, instrument, and cottonoid counts were correct.  The patient was then extubated and taken to the postanesthesia care unit in stable hemodynamic condition.   [name], MD [facility]

## 2020-09-05 NOTE — Progress Notes (Signed)
Patient returned from RT at [facility] at this time in NAD.   Blood pressure (!) 163/86, pulse 68, temperature 98.4 F (36.9 C), resp. rate 13, height 5\' 10"  (1.778 m), weight 87.1 kg, SpO2 100 %.

## 2020-09-05 NOTE — Progress Notes (Signed)
Inpatient Rehab Admissions:  Inpatient Rehab Consult received.  I met with patient's spouse at the bedside for rehabilitation assessment and to discuss goals and expectations of an inpatient rehab admission.  Pt at [facility] for radiation this AM.  I explained to [employer] program, average length of stay about 2 weeks (dependent upon progress), and 3 hours of therapy 5-6 days/week.  She and patient are very hopeful for CIR when able.  I explained insurance authorization process and will begin that today.  Also will need to coordinate with radiation schedule.  Will continue to follow.   Signed: Shann Medal, PT, DPT Admissions Coordinator [phone] 09/05/20  11:10 AM

## 2020-09-05 NOTE — Progress Notes (Signed)
Physical Therapy Treatment Patient Details Name: Gabriel [name] MRN: [id number] DOB: 1930-08-21 Today's Date: 09/05/2020    History of Present Illness 87 yo male s/p right temporal craniotomy for resection of 4.5 cm R temporal tumor on 5/31 to address intracranial metastases, pt with stage IV melanoma. 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    PT Comments    Pt resting upon PT arrival to room, had radiation this am. Pt presents with L inattention, especially during gait and with distraction, requires cues to correct. Pt continues to demonstrate impaired balance and gait, with decreased L DF noted today. Pt with poor navigational ability, does not realize this even as he got lost getting back to room when we were on the same hallway as his room. PT continuing to recommend CIR for cognitive and mobility deficits.    Follow Up Recommendations  CIR;Supervision for mobility/OOB     Equipment Recommendations  None recommended by PT    Recommendations for Other Services       Precautions / Restrictions Precautions Precautions: Fall Precaution Comments: crani Restrictions Weight Bearing Restrictions: No    Mobility  Bed Mobility Overal bed mobility: Needs Assistance       Supine to sit: Min guard;HOB elevated     General bed mobility comments: up in chair    Transfers Overall transfer level: Needs assistance Equipment used: Rolling walker (2 wheeled) Transfers: Sit to/from Stand Sit to Stand: Min assist         General transfer comment: min assist for power up, rise, steady. VC for hand placement when rising and sitting, especially on/off toilet. STS x2, from recliner and toilet.  Ambulation/Gait Ambulation/Gait assistance: Min assist Gait Distance (Feet): 80 Feet Assistive device: Rolling walker (2 wheeled) Gait Pattern/deviations: Step-through pattern;Decreased stride length;Trunk flexed;Decreased dorsiflexion - left Gait  velocity: decr   General Gait Details: Min assist to steady, physically guide RW around obstacles on L, and verbal cuing for increased L foot clearance as pt with decreased DF especialyl when distracted.   Stairs             Wheelchair Mobility    Modified Rankin (Stroke Patients Only) Modified Rankin (Stroke Patients Only) Pre-Morbid Rankin Score: Slight disability Modified Rankin: Moderately severe disability     Balance Overall balance assessment: Needs assistance Sitting-balance support: No upper extremity supported;Feet supported Sitting balance-Leahy Scale: Fair     Standing balance support: Bilateral upper extremity supported;During functional activity Standing balance-Leahy Scale: Poor Standing balance comment: heavily reliant on RW                            Cognition Arousal/Alertness: Awake/alert Behavior During Therapy: Flat affect;Impulsive Overall Cognitive Status: Impaired/Different from baseline Area of Impairment: Attention;Memory;Safety/judgement;Awareness;Problem solving                   Current Attention Level: Selective Memory: Decreased short-term memory Following Commands: Follows one step commands consistently Safety/Judgement: Decreased awareness of safety;Decreased awareness of deficits Awareness: Emergent Problem Solving: Slow processing;Difficulty sequencing General Comments: Pt pleasant, at times PT feels pt is confused about situation as pt states to PT once back in recliner "now let's visit a while, how are you?". Inattentive to L requiring cues to attend. Pt requires max cuing for form and safety. Pt unable to find his way back to his room after making 2 hallway turns, requires max cues to look for room numbers.  Exercises General Exercises - Lower Extremity Ankle Circles/Pumps: AROM;Both;10 reps;Seated Long Arc Quad: AROM;Both;10 reps;Seated    General Comments General comments (skin integrity, edema,  etc.): VSS      Pertinent Vitals/Pain Pain Assessment: Faces Faces Pain Scale: Hurts a little bit Pain Location: head Pain Descriptors / Indicators: Discomfort Pain Intervention(s): Limited activity within patient's tolerance;Monitored during session;Repositioned    Home Living Family/patient expects to be discharged to:: Private residence Living Arrangements: Spouse/significant other Available Help at Discharge: Family;Available 24 hours/day Type of Home: House Home Access: Stairs to enter   Home Layout: One level Home Equipment: Kasandra Knudsen - single point      Prior Function Level of Independence: Independent with assistive device(s)      Comments: Began using a cane recently; Was working selling life ins and drove. Wife recently started driving   PT Goals (current goals can now be found in the care plan section) Acute Rehab PT Goals Patient Stated Goal: get back to independence PT Goal Formulation: With patient Time For Goal Achievement: 09/18/20 Potential to Achieve Goals: Good Progress towards PT goals: Progressing toward goals    Frequency    Min 4X/week      PT Plan Current plan remains appropriate    Co-evaluation              AM-PAC PT "6 Clicks" Mobility   Outcome Measure  Help needed turning from your back to your side while in a flat bed without using bedrails?: A Little Help needed moving from lying on your back to sitting on the side of a flat bed without using bedrails?: A Lot Help needed moving to and from a bed to a chair (including a wheelchair)?: A Lot Help needed standing up from a chair using your arms (e.g., wheelchair or bedside chair)?: A Little Help needed to walk in hospital room?: A Little Help needed climbing 3-5 steps with a railing? : A Lot 6 Click Score: 15    End of Session Equipment Utilized During Treatment: Gait belt Activity Tolerance: Patient tolerated treatment well Patient left: in chair;with call bell/phone within  reach;with chair alarm set Nurse Communication: Mobility status;Other (comment) (pt had BM) PT Visit Diagnosis: Other abnormalities of gait and mobility (R26.89);Muscle weakness (generalized) (M62.81)     Time: 3825-0539 PT Time Calculation (min) (ACUTE ONLY): 40 min  Charges:  $Gait Training: 8-22 mins $Therapeutic Activity: 8-22 mins                    Stacie Glaze, PT DPT Acute Rehabilitation Services Pager [phone]  Office [phone]   [place] 09/05/2020, 5:22 PM

## 2020-09-05 NOTE — Op Note (Signed)
  Name: Gabriel [name]  MRN: [id number]  Date: 08/26/2020   DOB: 05-03-1930  Stereotactic Radiosurgery Operative Note  PRE-OPERATIVE DIAGNOSIS:  Multiple Brain Metastases  POST-OPERATIVE DIAGNOSIS:  Multiple Brain Metastases  PROCEDURE:  Stereotactic Radiosurgery  SURGEON:  Jairo Ben, MD  RADIATION ONCOLOGIST: Dr. Kyung Rudd, MD  NARRATIVE: The patient underwent a radiation treatment planning session in the radiation oncology simulation suite under the care of the radiation oncology physician and physicist.  I participated closely in the radiation treatment planning afterwards. The patient underwent planning CT which was fused to 3T high resolution MRI with 1 mm axial slices.  These images were fused on the planning system.  We contoured the gross target volumes and subsequently expanded this to yield the Planning Target Volume. I actively participated in the planning process.  I helped to define and review the target contours and also the contours of the optic pathway, eyes, brainstem and selected nearby organs at risk.  All the dose constraints for critical structures were reviewed and compared to AAPM Task Group 101.  The prescription dose conformity was reviewed.  I approved the plan electronically.    Accordingly, Gabriel [name] was brought to the TrueBeam stereotactic radiation treatment linac and placed in the custom immobilization mask.  The patient was aligned according to the IR fiducial markers with BrainLab Exactrac, then orthogonal x-rays were used in ExacTrac with the 6DOF robotic table and the shifts were made to align the patient  Gabriel [name] received stereotactic radiosurgery uneventfully.    Lesions treated:  3   Complex lesions treated:  1 (>3.5 cm, <24mm of optic path, or within the brainstem)  Right frontal lesion - 20Gy Left frontal lesion - 20Gy Right temporal lesion (>3.5cm) - 13Gy  The detailed description of the procedure is recorded in the radiation  oncology procedure note.  I was present for the duration of the procedure.  DISPOSITION:  Following delivery, the patient was transported to nursing in stable condition and monitored for possible acute effects to be discharged to home in stable condition with follow-up in one month.  Jairo Ben, MD 09/05/2020 3:12 PM

## 2020-09-05 NOTE — Progress Notes (Signed)
  NEUROSURGERY PROGRESS NOTE   Pt seen and examined. No issues overnight. Minimal c/o HA. Had RT for lung lesion at [facility] today.  EXAM: Temp:  [97.5 F (36.4 C)-98.4 F (36.9 C)] 98.4 F (36.9 C) (06/02 1147) Pulse Rate:  [64-84] 76 (06/02 1400) Resp:  [13-24] 18 (06/02 1400) BP: (105-182)/(57-113) 128/67 (06/02 1400) SpO2:  [88 %-100 %] 88 % (06/02 1400) Intake/Output      06/01 0701 06/02 0700 06/02 0701 06/03 0700   I.V. (mL/kg) 1742.1 (20) 446.8 (5.1)   IV Piggyback 200 100   Total Intake(mL/kg) 1942.1 (22.3) 546.8 (6.3)   Urine (mL/kg/hr) 2175 (1) 200 (0.3)   Blood     Total Output 2175 200   Net -232.9 +346.8        Urine Occurrence  1 x    Awake, alert, oriented Speech fluent, appropriate CN grossly intact MAE good strength  Turban removed, wound c/d/i  LABS: Lab Results  Component Value Date   CREATININE 0.95 09/03/2020   BUN 29 (H) 09/03/2020   NA 136 09/03/2020   K 4.4 09/03/2020   CL 102 09/03/2020   CO2 23 09/03/2020   Lab Results  Component Value Date   WBC 7.4 09/03/2020   HGB 10.9 (L) 09/03/2020   HCT 32.0 (L) 09/03/2020   MCV 89.7 09/03/2020   PLT 128 (L) 09/03/2020    IMPRESSION: - 87 y.o. male POD#2 s/p right temporal crani for tumor resection, at neurologic baseline  PLAN: - Cont PT/OT - Can transfer to stepdown today - Cont Keppra/dex - GI/DVT prophylaxis  - Planning CIR pending insurance approval/bed availability  [name], MD [facility]

## 2020-09-05 NOTE — Progress Notes (Addendum)
Inpatient Rehab Admissions Coordinator:   Awaiting OT notes to complete insurance auth request.  Note pt in radiation this morning, so if unable to see pt today will send them tomorrow.   Shann Medal, PT, DPT Admissions Coordinator [phone] 09/05/20  3:15 PM

## 2020-09-05 NOTE — Progress Notes (Signed)
Patient transported to [facility] for RT via [place] as per schedule. Patient stable and in NAD. VSS.

## 2020-09-05 NOTE — Progress Notes (Signed)
Occupational Therapy Evaluation Patient Details Name: Gabriel [name] MRN: [id number] DOB: 07-29-1930 Today's Date: 09/05/2020    History of Present Illness 87 yo male s/p right temporal craniotomy for resection of 4.5 cm R temporal tumor on 5/31 to address intracranial metastases, pt with stage IV melanoma. 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   Clinical Impression   PTA pt lived independently with his wife, working English as a second language teacher and independent with all IADL tasks. Pt just recently started using a cane and stopped driving. Pt demonstrates significant decline in functional level of independence due to visual, cognitive and perceptual deficits, in addition to deficits listed below. Feel pt is an excellent candidate for CIR to facilitate safe DC home with supportive family. Will follow acutely.     Follow Up Recommendations  CIR    Equipment Recommendations  3 in 1 bedside commode    Recommendations for Other Services Rehab consult     Precautions / Restrictions Precautions Precautions: Fall Precaution Comments: crani      Mobility Bed Mobility Overal bed mobility: Needs Assistance       Supine to sit: Min guard;HOB elevated          Transfers Overall transfer level: Needs assistance Equipment used: Rolling walker (2 wheeled) Transfers: Sit to/from Stand Sit to Stand: Min assist              Balance     Sitting balance-Leahy Scale: Fair       Standing balance-Leahy Scale: Poor Standing balance comment: heavily reliant on RW                           ADL either performed or assessed with clinical judgement   ADL Overall ADL's : Needs assistance/impaired Eating/Feeding: Set up;Supervision/ safety Eating/Feeding Details (indicate cue type and reason): impulsive during eating with increased spillage. Poured entire bowl of shredded cheese onto pizza, then flipped pizza upside down spilling majority of  cheese onto his lap wtih poor awareness Grooming: Set up;Supervision/safety;Sitting   Upper Body Bathing: Set up;Supervision/ safety;Sitting   Lower Body Bathing: Minimal assistance;Sit to/from stand   Upper Body Dressing : Minimal assistance;Sitting   Lower Body Dressing: Moderate assistance;Sit to/from stand   Toilet Transfer: RW;Minimal IT trainer and Hygiene: Sit to/from stand;Minimal assistance       Functional mobility during ADLs: Minimal assistance;Rolling walker;Cueing for safety       Vision Baseline Vision/History: Wears glasses Wears Glasses: Reading only Vision Assessment?: Yes Eye Alignment: Within Functional Limits Ocular Range of Motion: Within Functional Limits Alignment/Gaze Preference: Chin down (poor eye contact: L eye strabismus? at baseline) Tracking/Visual Pursuits: Decreased smoothness of horizontal tracking;Decreased smoothness of vertical tracking Saccades: Additional head turns occurred during testing Convergence: Within functional limits Visual Fields:  (appear intact) Additional Comments: poor visual attention; requires cues to maintain vision.Note errors during reading     Perception Perception Perception Tested?: Yes Perception Deficits: Inattention/neglect (note inattention on the L during more challenging activities. When attempting to position tray for lunch, pt unable to lift L foot off tray and requires physical assist as pt unable to see foot;) Inattention/Neglect: Impaired- to be further tested in functional context Spatial deficits: unable to complete clock draw; poor spacing of numbers; unable to set time; unaware of errors   Praxis Praxis Praxis tested?: Deficits Praxis-Other Comments: difficuulty wtih more complex tasks and easily distracted    Pertinent Vitals/Pain Pain Assessment:  Faces Faces Pain Scale: Hurts a little bit Pain Location: generalized during mobility Pain Descriptors /  Indicators: Discomfort Pain Intervention(s): Limited activity within patient's tolerance     Hand Dominance Right   Extremity/Trunk Assessment Upper Extremity Assessment Upper Extremity Assessment: LUE deficits/detail LUE Coordination:  (mild incoordination noted)   Lower Extremity Assessment Lower Extremity Assessment: Defer to PT evaluation   Cervical / Trunk Assessment Cervical / Trunk Assessment: Kyphotic (cues for upright posture)   Communication Communication Communication: No difficulties   Cognition Arousal/Alertness: Awake/alert Behavior During Therapy: Flat affect;Impulsive Overall Cognitive Status: Impaired/Different from baseline Area of Impairment: Attention;Memory;Safety/judgement;Awareness;Problem solving                   Current Attention Level: Selective Memory: Decreased short-term memory Following Commands: Follows one step commands consistently Safety/Judgement: Decreased awareness of safety;Decreased awareness of deficits Awareness: Emergent Problem Solving: Slow processing;Difficulty sequencing General Comments: Scored an 8/28 on the Short Blessed Test indicating impairment; easily distracted; asked same questions about my children 3 different times; tangential at times   General Comments  VSS    Exercises     Shoulder Instructions      Home Living Family/patient expects to be discharged to:: Private residence Living Arrangements: Spouse/significant other Available Help at Discharge: Family;Available 24 hours/day Type of Home: House Home Access: Stairs to enter [employer] of Steps: 2   Home Layout: One level     Bathroom Shower/Tub: Occupational psychologist: Standard Bathroom Accessibility: Yes How Accessible: Accessible via walker Home Equipment: Cane - single point          Prior Functioning/Environment Level of Independence: Independent with assistive device(s)        Comments: Began using a cane  recently; Was working selling life ins and drove. Wife recently started driving        OT Problem List: Decreased strength;Decreased activity tolerance;Impaired balance (sitting and/or standing);Impaired vision/perception;Decreased coordination;Decreased cognition;Decreased safety awareness;Decreased knowledge of use of DME or AE;Impaired UE functional use;Pain      OT Treatment/Interventions: Self-care/ADL training;Therapeutic exercise;Neuromuscular education;Energy conservation;DME and/or AE instruction;Therapeutic activities;Cognitive remediation/compensation;Visual/perceptual remediation/compensation;Patient/family education;Balance training    OT Goals(Current goals can be found in the care plan section) Acute Rehab OT Goals Patient Stated Goal: get back to independence OT Goal Formulation: With patient/family Time For Goal Achievement: 09/19/20 Potential to Achieve Goals: Good  OT Frequency: Min 2X/week   Barriers to D/C:            Co-evaluation              AM-PAC OT "6 Clicks" Daily Activity     Outcome Measure Help from another person eating meals?: A Little Help from another person taking care of personal grooming?: A Little Help from another person toileting, which includes using toliet, bedpan, or urinal?: A Little Help from another person bathing (including washing, rinsing, drying)?: A Little Help from another person to put on and taking off regular upper body clothing?: A Little Help from another person to put on and taking off regular lower body clothing?: A Lot 6 Click Score: 17   End of Session Equipment Utilized During Treatment: Gait belt;Rolling walker Nurse Communication: Mobility status  Activity Tolerance: Patient tolerated treatment well Patient left: in chair;with call bell/phone within reach;with chair alarm set;with family/visitor present  OT Visit Diagnosis: Unsteadiness on feet (R26.81);Other abnormalities of gait and mobility  (R26.89);Muscle weakness (generalized) (M62.81);Other symptoms and signs involving cognitive function;Other symptoms and signs involving the nervous system (R29.898);Pain;Low  vision, both eyes (H54.2) Pain - Right/Left:  (head) Pain - part of body:  (head)                Time: 5009-3818 OT Time Calculation (min): 31 min Charges:  OT General Charges $OT Visit: 1 Visit OT Evaluation $OT Eval Moderate Complexity: 1 Mod OT Treatments $Self Care/Home Management : 8-22 mins  Maurie Boettcher, OT/L   Acute OT Clinical Specialist [place] Pager [phone] Office [phone]   [facility] 09/05/2020, 3:56 PM

## 2020-09-06 ENCOUNTER — Ambulatory Visit
Admission: RE | Admit: 2020-09-06 | Discharge: 2020-09-06 | Disposition: A | Discharge status: Home / Self Care | Payer: Medicare Other | Source: Ambulatory Visit | Attending: Radiation Oncology | Admitting: Radiation Oncology

## 2020-09-06 LAB — CBC
HCT: 31.7 % — ABNORMAL LOW (ref 39.0–52.0)
Hemoglobin: 9.8 g/dL — ABNORMAL LOW (ref 13.0–17.0)
MCH: 27.2 pg (ref 26.0–34.0)
MCHC: 30.9 g/dL (ref 30.0–36.0)
MCV: 88.1 fL (ref 80.0–100.0)
Platelets: 145 10*3/uL — ABNORMAL LOW (ref 150–400)
RBC: 3.6 MIL/uL — ABNORMAL LOW (ref 4.22–5.81)
RDW: 17.6 % — ABNORMAL HIGH (ref 11.5–15.5)
WBC: 9.4 10*3/uL (ref 4.0–10.5)
nRBC: 0 % (ref 0.0–0.2)

## 2020-09-06 MED ORDER — LEVETIRACETAM 500 MG PO TABS
500.0000 mg | ORAL_TABLET | Freq: Two times a day (BID) | ORAL | Status: DC
  Administered 2020-09-06 – 2020-09-10 (×8): 500 mg via ORAL
  Filled 2020-09-06 (×8): qty 1

## 2020-09-06 MED ORDER — PANTOPRAZOLE SODIUM 40 MG PO TBEC
40.0000 mg | DELAYED_RELEASE_TABLET | Freq: Every day | ORAL | Status: DC
  Administered 2020-09-06 – 2020-09-09 (×4): 40 mg via ORAL
  Filled 2020-09-06 (×4): qty 1

## 2020-09-06 NOTE — Care Management Important Message (Signed)
Important Message  Patient Details  Name: Gabriel [name] MRN: [id number] Date of Birth: Aug 12, 1930   Medicare Important Message Given:  Yes     Orbie Pyo 09/06/2020, 2:46 PM

## 2020-09-06 NOTE — Progress Notes (Signed)
During a bowel movement, the patient experienced blood from the rectum. Patient stated he has Hemorids, but during assessment patient stated he has never been formally been diagnosed with hemoroids or discussed the complication with a Medical doctor.   Patient was assessed and cleaned up. MD was paged. Nurse awaiting a call back.

## 2020-09-06 NOTE — Progress Notes (Signed)
  NEUROSURGERY PROGRESS NOTE   No issues overnight. Pt had BRBPR during BM this am. Reports hx of similar episodes in the past related to hemorrhoids. Bleeding has stopped.  EXAM:  BP (!) 159/85 (BP Location: Right Arm)   Pulse 65   Temp (!) 97.5 F (36.4 C) (Oral)   Resp 16   Ht _0  (1.778 m)   Wt 87.1 kg   SpO2 100%   BMI 27.55 kg/m   Awake, alert, oriented  Speech fluent, appropriate  CN grossly intact  5/5 BUE/BLE   IMPRESSION:  87 y.o. male POD# 3 s/p right crani for melanoma met, neurologically well BRBPR sec to hemorrhoids. Vitals stable  PLAN: - Cont to monitor, will hold subQ heparin today - [facility] for continued chest radiation - CIR when bed available   [name], MD [facility]

## 2020-09-06 NOTE — Progress Notes (Signed)
Inpatient Rehab Admissions Coordinator:   Left message for radiation coordinator to discuss scheduling next week.  Still awaiting determination from insurance regarding CIR authorization.  Note pt min guard 150' and progressing well with PT/OT.    Shann Medal, PT, DPT Admissions Coordinator [phone] 09/06/20  3:01 PM

## 2020-09-06 NOTE — Progress Notes (Signed)
Patient had another bout of rectal bleeding.. Area was assessed and cleaned up. Currently reaching out to the provider. Patient is stable and resting.

## 2020-09-06 NOTE — Progress Notes (Signed)
Provider called back and gave order for a CBC. Order has been placed.

## 2020-09-06 NOTE — Progress Notes (Signed)
Physical Therapy Treatment Patient Details Name: Gabriel [name] MRN: [id number] DOB: [birth year] Today's Date: 09/06/2020    History of Present Illness 87 yo male s/p right temporal craniotomy for resection of 4.5 cm R temporal tumor on 5/31 to address intracranial metastases, pt with stage IV melanoma. 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    PT Comments    Patient received sitting up on side of bed, wife present in room. Patient is agreeable to PT session. He requires min guard for sit to stand from bed and toilet. Cues for safety and left hand awareness. Patient ambulated with RW and min guard 150 feet.  Cues for direction back to room. Improved ability to avoid obstacles while ambulating. Patient will continue to benefit from skilled PT while here to improve safety with mobility.         Follow Up Recommendations  CIR;Supervision for mobility/OOB     Equipment Recommendations  None recommended by PT    Recommendations for Other Services       Precautions / Restrictions Precautions Precautions: Fall Precaution Comments: crani Restrictions Weight Bearing Restrictions: No    Mobility  Bed Mobility               General bed mobility comments: patient sitting up on side of bed upon arrival    Transfers Overall transfer level: Needs assistance Equipment used: Rolling walker (2 wheeled) Transfers: Sit to/from Stand Sit to Stand: Min guard         General transfer comment: Min guard for transfers, Cues for left hand placement and safety.  Ambulation/Gait Ambulation/Gait assistance: Min guard Gait Distance (Feet): 150 Feet Assistive device: Rolling walker (2 wheeled) Gait Pattern/deviations: Step-through pattern;Decreased stride length;Trunk flexed;Decreased dorsiflexion - left Gait velocity: decr   General Gait Details: improved ability to avoid obstacles on left. Cues for improving L foot clearance   Stairs              Wheelchair Mobility    Modified Rankin (Stroke Patients Only) Modified Rankin (Stroke Patients Only) Pre-Morbid Rankin Score: Slight disability Modified Rankin: Moderately severe disability     Balance Overall balance assessment: Needs assistance Sitting-balance support: Feet supported Sitting balance-Leahy Scale: Good     Standing balance support: Bilateral upper extremity supported;During functional activity Standing balance-Leahy Scale: Fair Standing balance comment: reliant on RW                            Cognition Arousal/Alertness: Awake/alert Behavior During Therapy: Flat affect Overall Cognitive Status: Impaired/Different from baseline Area of Impairment: Attention;Safety/judgement;Awareness;Problem solving                   Current Attention Level: Selective Memory: Decreased short-term memory Following Commands: Follows one step commands consistently Safety/Judgement: Decreased awareness of safety;Decreased awareness of deficits Awareness: Emergent Problem Solving: Slow processing;Difficulty sequencing General Comments: Patient likes to talk and hear stories. Patient is inattentive to left, requires cues to move L hand to walker      Exercises      General Comments        Pertinent Vitals/Pain Pain Assessment: No/denies pain    Home Living                      Prior Function            PT Goals (current goals can now be found in the care plan section) Acute  Rehab PT Goals Patient Stated Goal: get back to independence PT Goal Formulation: With patient/family Time For Goal Achievement: 09/18/20 Potential to Achieve Goals: Good Progress towards PT goals: Progressing toward goals    Frequency    Min 4X/week      PT Plan Current plan remains appropriate    Co-evaluation              AM-PAC PT "6 Clicks" Mobility   Outcome Measure  Help needed turning from your back to your side while in a flat bed  without using bedrails?: A Little Help needed moving from lying on your back to sitting on the side of a flat bed without using bedrails?: A Little Help needed moving to and from a bed to a chair (including a wheelchair)?: A Little Help needed standing up from a chair using your arms (e.g., wheelchair or bedside chair)?: A Little Help needed to walk in hospital room?: A Little Help needed climbing 3-5 steps with a railing? : A Little 6 Click Score: 18    End of Session Equipment Utilized During Treatment: Gait belt Activity Tolerance: Patient tolerated treatment well Patient left: in bed;with call bell/phone within reach;with family/visitor present Nurse Communication: Mobility status PT Visit Diagnosis: Other abnormalities of gait and mobility (R26.89);Muscle weakness (generalized) (M62.81)     Time: 1015-1050 PT Time Calculation (min) (ACUTE ONLY): 35 min  Charges:  $Gait Training: 38-52 mins                     Mariacristina Aday, PT, GCS 09/06/20,11:00 AM

## 2020-09-07 LAB — CBC WITH DIFFERENTIAL/PLATELET
Abs Immature Granulocytes: 0.08 10*3/uL — ABNORMAL HIGH (ref 0.00–0.07)
Basophils Absolute: 0 10*3/uL (ref 0.0–0.1)
Basophils Relative: 0 %
Eosinophils Absolute: 0 10*3/uL (ref 0.0–0.5)
Eosinophils Relative: 0 %
HCT: 32.9 % — ABNORMAL LOW (ref 39.0–52.0)
Hemoglobin: 10.1 g/dL — ABNORMAL LOW (ref 13.0–17.0)
Immature Granulocytes: 1 %
Lymphocytes Relative: 5 %
Lymphs Abs: 0.4 10*3/uL — ABNORMAL LOW (ref 0.7–4.0)
MCH: 27.3 pg (ref 26.0–34.0)
MCHC: 30.7 g/dL (ref 30.0–36.0)
MCV: 88.9 fL (ref 80.0–100.0)
Monocytes Absolute: 0.3 10*3/uL (ref 0.1–1.0)
Monocytes Relative: 3 %
Neutro Abs: 8 10*3/uL — ABNORMAL HIGH (ref 1.7–7.7)
Neutrophils Relative %: 91 %
Platelets: 165 10*3/uL (ref 150–400)
RBC: 3.7 MIL/uL — ABNORMAL LOW (ref 4.22–5.81)
RDW: 17.7 % — ABNORMAL HIGH (ref 11.5–15.5)
WBC: 8.8 10*3/uL (ref 4.0–10.5)
nRBC: 0 % (ref 0.0–0.2)

## 2020-09-07 MED ORDER — DEXAMETHASONE 2 MG PO TABS
2.0000 mg | ORAL_TABLET | Freq: Three times a day (TID) | ORAL | Status: DC
  Administered 2020-09-07 – 2020-09-10 (×10): 2 mg via ORAL
  Filled 2020-09-07 (×10): qty 1

## 2020-09-07 NOTE — Progress Notes (Signed)
Subjective: Patient reports Patient doing well denies any headache or any complaints  Objective: Vital signs in last 24 hours: Temp:  [97.5 F (36.4 C)-98.6 F (37 C)] 98.2 F (36.8 C) (06/04 0717) Pulse Rate:  [61-80] 67 (06/04 0717) Resp:  [16-20] 18 (06/04 0717) BP: (123-159)/(65-87) 130/84 (06/04 0717) SpO2:  [96 %-100 %] 100 % (06/04 0717)  Intake/Output from previous day: No intake/output data recorded. Intake/Output this shift: Total I/O In: 240 [P.O.:240] Out: -   Awake alert oriented strength 5 and 5 incision clean dry and intact  Lab Results: Recent Labs    09/06/20 1822  WBC 9.4  HGB 9.8*  HCT 31.7*  PLT 145*   BMET No results for input(s): NA, K, CL, CO2, GLUCOSE, BUN, CREATININE, CALCIUM in the last 72 hours.  Studies/Results: No results found.  Assessment/Plan: Postop craniotomy for metastatic lesion patient with bright red blood per rectum consistent with history of hemorrhoids has had heparin held it is slowing down hematocrit yesterday was 31 we will repeat this morning.  LOS: 4 days     Elaina Hoops 09/07/2020, 9:01 AM

## 2020-09-07 NOTE — Progress Notes (Signed)
Physical Therapy Treatment Patient Details Name: Gabriel [name] MRN: [id number] DOB: September 26, 1930 Today's Date: 09/07/2020    History of Present Illness 87 yo male s/p right temporal craniotomy for resection of 4.5 cm R temporal tumor on 5/31 to address intracranial metastases, pt with stage IV melanoma. 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    PT Comments    The pt was eager to participate in PT session this afternoon, with focus on gait and dynamic stability challenge to further assess for appropriate d/c recommendation. Despite improvements in pt's activity tolerance and general endurance, he remains significantly limited in safety awareness and both static and dynamic stability. The pt is eager to mobilize and quick to overstate his abilities, with limited insight to deficits in balance and need for assist. The pt was able to complete multiple sit-stand transfers, but required VC for technique as well as minA to steady due to multiple episodes of posterior lean with initial stand that required assist to allow pt to make adjustments. The pt was also challenged by addition of movements such as quick turns, changes in direction, and stepping over/around objects to mimic movement in a home environment. The pt requires significant increase in assist with any dynamic balance challenge, mostly minA with moments of modA, neither of which his wife would safely be able to provide. Continue to recommend CIR level therapies at d/c as best course to maximize pt recovery and facilitate safe return of independence and reduced risk of falls upon eventual return home.    Follow Up Recommendations  CIR;Supervision for mobility/OOB     Equipment Recommendations  None recommended by PT    Recommendations for Other Services       Precautions / Restrictions Precautions Precautions: Fall Precaution Comments: crani Restrictions Weight Bearing Restrictions: No    Mobility   Bed Mobility Overal bed mobility: Needs Assistance             General bed mobility comments: pt OOB in recliner at start and end of session    Transfers Overall transfer level: Needs assistance Equipment used: 1 person hand held assist Transfers: Sit to/from Stand Sit to Stand: Min assist         General transfer comment: minA to steady while pt using BLE rather than BUE to push up from chair. Pt with posterior LOB requiring min-modA to steady while he made adjustements on multiple occasions. completed x10 through session  Ambulation/Gait Ambulation/Gait assistance: Min assist;Mod assist Gait Distance (Feet): 75 Feet Assistive device: 1 person hand held assist Gait Pattern/deviations: Step-through pattern;Decreased stride length;Decreased stance time - left;Decreased dorsiflexion - left;Decreased weight shift to left;Staggering left;Staggering right Gait velocity: decreased Gait velocity interpretation: 1.31 - 2.62 ft/sec, indicative of limited community ambulator General Gait Details: pt with wide BOS and heavy reliance on HHA and RLE. Decreased wt acceptance to L, multiple posterior and L lateral LOB requiring modA to recover.      Modified Rankin (Stroke Patients Only) Modified Rankin (Stroke Patients Only) Pre-Morbid Rankin Score: Slight disability Modified Rankin: Moderately severe disability     Balance Overall balance assessment: Needs assistance Sitting-balance support: Feet supported Sitting balance-Leahy Scale: Good     Standing balance support: Single extremity supported;During functional activity Standing balance-Leahy Scale: Poor Standing balance comment: reliant on UE support             High level balance activites: Backward walking;Direction changes;Sudden stops;Other (comment) (weaving, stepping oer objects) High Level Balance Comments: pt with significant  increase in assist needed with any dynamic balance challenge. MinA to modA to steady  with quick turns, stops, or changes in direction. The pt was also challenged by stepping over or around objects on the ground, and often needed cues to make adjustemnts and to maintain upright while pt made adjustements            Cognition Arousal/Alertness: Awake/alert Behavior During Therapy: Flat affect Overall Cognitive Status: Impaired/Different from baseline Area of Impairment: Attention;Safety/judgement;Awareness;Problem solving                   Current Attention Level: Selective Memory: Decreased short-term memory Following Commands: Follows one step commands consistently Safety/Judgement: Decreased awareness of safety;Decreased awareness of deficits Awareness: Emergent Problem Solving: Slow processing;Difficulty sequencing General Comments: Pt inattentive of L, significant deficits in safety as he states he is safe to ambulate to bathroom without assistance after walking with PT and having multiple LOB requiring assist to manage.      Exercises General Exercises - Lower Extremity Long Arc Quad: PROM;Both;10 reps;Seated Hip Flexion/Marching: AROM;Both;10 reps;Seated Toe Raises: AROM;Both;10 reps;Seated Heel Raises: AROM;Both;10 reps;Seated Other Exercises Other Exercises: squat to recliner x 10 with minA to steady, cues to reduce use of UE    General Comments General comments (skin integrity, edema, etc.): VSS      Pertinent Vitals/Pain Pain Assessment: No/denies pain Pain Intervention(s): Monitored during session           PT Goals (current goals can now be found in the care plan section) Acute Rehab PT Goals Patient Stated Goal: get back to independence PT Goal Formulation: With patient/family Time For Goal Achievement: 09/18/20 Potential to Achieve Goals: Good Progress towards PT goals: Progressing toward goals    Frequency    Min 4X/week      PT Plan Current plan remains appropriate       AM-PAC PT "6 Clicks" Mobility   Outcome  Measure  Help needed turning from your back to your side while in a flat bed without using bedrails?: A Little Help needed moving from lying on your back to sitting on the side of a flat bed without using bedrails?: A Little Help needed moving to and from a bed to a chair (including a wheelchair)?: A Little Help needed standing up from a chair using your arms (e.g., wheelchair or bedside chair)?: A Little Help needed to walk in hospital room?: A Lot Help needed climbing 3-5 steps with a railing? : A Lot 6 Click Score: 16    End of Session Equipment Utilized During Treatment: Gait belt Activity Tolerance: Patient tolerated treatment well Patient left: with call bell/phone within reach;with family/visitor present;in chair;with chair alarm set Nurse Communication: Mobility status PT Visit Diagnosis: Other abnormalities of gait and mobility (R26.89);Muscle weakness (generalized) (M62.81)     Time: 9509-3267 PT Time Calculation (min) (ACUTE ONLY): 26 min  Charges:  $Gait Training: 8-22 mins $Neuromuscular Re-education: 8-22 mins                     Karma Ganja, PT, DPT   Acute Rehabilitation Department Pager #: [phone]   Otho Bellows 09/07/2020, 5:46 PM

## 2020-09-07 NOTE — Plan of Care (Signed)

## 2020-09-08 NOTE — Progress Notes (Signed)
Subjective: Patient reports Well still with some significant balance difficulties but improving  Objective: Vital signs in last 24 hours: Temp:  [97.5 F (36.4 C)-98.1 F (36.7 C)] 97.5 F (36.4 C) (06/05 0825) Pulse Rate:  [62-79] 79 (06/05 0825) Resp:  [15-18] 15 (06/05 0825) BP: (97-113)/(59-68) 97/62 (06/05 0825) SpO2:  [99 %-100 %] 99 % (06/05 0825)  Intake/Output from previous day: 06/04 0701 - 06/05 0700 In: 240 [P.O.:240] Out: -  Intake/Output this shift: No intake/output data recorded.  Awake and alert strength 5 out of 5 incision clean dry and intact  Lab Results: Recent Labs    09/06/20 1822 09/07/20 0957  WBC 9.4 8.8  HGB 9.8* 10.1*  HCT 31.7* 32.9*  PLT 145* 165   BMET No results for input(s): NA, K, CL, CO2, GLUCOSE, BUN, CREATININE, CALCIUM in the last 72 hours.  Studies/Results: No results found.  Assessment/Plan: Doing well status post craniotomy awaiting on rehab placement scheduled for radiation treatment tomorrow  LOS: 5 days     Elaina Hoops 09/08/2020, 9:14 AM

## 2020-09-08 NOTE — Plan of Care (Signed)

## 2020-09-09 ENCOUNTER — Ambulatory Visit: Payer: Medicare Other

## 2020-09-09 ENCOUNTER — Ambulatory Visit
Admission: RE | Admit: 2020-09-09 | Discharge: 2020-09-09 | Disposition: A | Discharge status: Home / Self Care | Payer: Medicare Other | Source: Ambulatory Visit | Attending: Radiation Oncology | Admitting: Radiation Oncology

## 2020-09-09 ENCOUNTER — Inpatient Hospital Stay: Payer: Medicare Other | Attending: Internal Medicine

## 2020-09-09 NOTE — Progress Notes (Signed)
OT Cancellation Note  Patient Details Name: KRISTOFER SCHAFFERT MRN: [id number] DOB: Feb 28, 1931   Cancelled Treatment:    Reason Eval/Treat Not Completed: Patient at procedure or test/ unavailable;Other (comment) Transport arriving upon OTA arrival to transport pt to radiation treatment, will check back as time allows for OT session.  Corinne Ports K., COTA/L Acute Rehabilitation Services [phone]   Precious Haws 09/09/2020, 9:15 AM

## 2020-09-09 NOTE — Progress Notes (Signed)
  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  PTV_2 Lt Front 69mm was treated to 20 Gy in one fraction.  PTV_3 Rt Par 17mm: was treated to 20 Gy in one fraction.     Narrative: The patient tolerated radiation treatment well.   There were no signs of acute toxicity after treatment. He will have surgery to resect the right temporal lobe on 09/03/20.  Plan: The patient will receive a call in about one month from the radiation oncology department. He will continue follow up with Dr. Julien Nordmann as well as Dr. Mickeal Skinner in Neuro Oncology in surveillance in our brain oncology program. ________________________________    Carola Rhine, [facility]

## 2020-09-09 NOTE — Progress Notes (Signed)
Attempted to see pt this am however he has already been transferred to [facility] for chest radiation. Per RN, he has remained at neurologic baseline without significant complaint this am. Will f/u again tomorrow, he appears to remain stable from my standpoint for CIR when approved/bed available.   [name], MD [facility]

## 2020-09-09 NOTE — Progress Notes (Signed)
Inpatient Rehab Admissions Coordinator:   I have insurance approval, but no bed available for this patient to admit to CIR today.  Will continue to follow for admission pending bed availability.   Shann Medal, PT, DPT Admissions Coordinator [phone] 09/09/20  3:32 PM

## 2020-09-09 NOTE — Progress Notes (Signed)
Inpatient Rehab Admissions Coordinator:   Awaiting determination from insurance regarding CIR authorization request.    Shann Medal, PT, DPT Admissions Coordinator [phone] 09/09/20  10:41 AM

## 2020-09-09 NOTE — Progress Notes (Signed)
Occupational Therapy Treatment Patient Details Name: Gabriel [name] MRN: [id number] DOB: Mar 27, 1931 Today's Date: 09/09/2020    History of present illness 87 yo male s/p right temporal craniotomy for resection of 4.5 cm R temporal tumor on 5/31 to address intracranial metastases, pt with stage IV melanoma. 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   OT comments  Pt making steady progress towards OT goals this session. Pt continues to present with impaired balance, decreased activity tolerance and impaired motor planning. Pt currently requires min guard assist- MIN A for functional mobility with RW. Pt completed toileting with min guard assist and standing grooming tasks with supervision. Pt with improvements in L inattention able to complete table top visual scanning task with no deficits noted. Pt required BUE support to complete dynamic balance task in standing with MIN A. Pt would continue to benefit from skilled occupational therapy while admitted and after d/c to address the below listed limitations in order to improve overall functional mobility and facilitate independence with BADL participation. DC plan remains appropriate, will follow acutely per POC.     Follow Up Recommendations  CIR    Equipment Recommendations  3 in 1 bedside commode    Recommendations for Other Services      Precautions / Restrictions Precautions Precautions: Fall Precaution Comments: crani Restrictions Weight Bearing Restrictions: No       Mobility Bed Mobility               General bed mobility comments: pt received off stretcher coming back from transport to radiation, pt left up in recliner    Transfers Overall transfer level: Needs assistance Equipment used: Rolling walker (2 wheeled) Transfers: Sit to/from Stand Sit to Stand: Min guard;Min assist         General transfer comment: minguard assist to rise for safety, MIN A needed when descending onto  sitting surface as pt with decreased recall of reaching back to sitting surface    Balance Overall balance assessment: Needs assistance Sitting-balance support: Feet supported Sitting balance-Leahy Scale: Good     Standing balance support: Bilateral upper extremity supported Standing balance-Leahy Scale: Poor Standing balance comment: reliant on BUE support during standing toe taps activity, pt with poor proprioception unable to complete toe taps wtih eyes occluded                           ADL either performed or assessed with clinical judgement   ADL Overall ADL's : Needs assistance/impaired     Grooming: Wash/dry hands;Standing;Supervision/safety                   Toilet Transfer: Actor           Functional mobility during ADLs: Min guard;Rolling walker General ADL Comments: pt completing toileting, grooming tasks, dynamic balance tasks and visual scanning tasks     Vision   Additional Comments: pt able to complete visual screening tasks where pt instructed to bisect all horizontal lines down the middle, pt completing task with 100% accuracy no L inattention noted. pt also able to draw clock, mild deficits noted but overall [facility]   Perception     Praxis Praxis Praxis-Other Comments: difficulty wtih more complex tasks and easily distracted, unable to occulde eyes during standing toe taps    Cognition Arousal/Alertness: Awake/alert Behavior During Therapy:  (tangential at times) Overall Cognitive Status: Impaired/Different from baseline Area of Impairment: Attention;Safety/judgement;Awareness;Problem solving;Memory;Following commands  Current Attention Level: Selective Memory: Decreased short-term memory (repeated cues for safety) Following Commands: Follows one step commands consistently;Follows multi-step commands with increased time Safety/Judgement: Decreased awareness of safety;Decreased  awareness of deficits Awareness: Emergent Problem Solving: Slow processing;Difficulty sequencing;Requires verbal cues General Comments: increased cues for safety and body mechanics during balance challenges, easily distracted and tangential at times        Exercises General Exercises - Lower Extremity Ankle Circles/Pumps: AROM;Both;10 reps;Seated Other Exercises Other Exercises: encouraged pt to practice motor planning activity of tapping LLE to target on  bottom of tray table from sitting in recliner   Shoulder Instructions       General Comments VSS on RA    Pertinent Vitals/ Pain       Pain Assessment: No/denies pain  Home Living                                          Prior Functioning/Environment              Frequency  Min 2X/week        Progress Toward Goals  OT Goals(current goals can now be found in the care plan section)  Progress towards OT goals: Progressing toward goals  Acute Rehab OT Goals Patient Stated Goal: to go to CIR OT Goal Formulation: With patient Time For Goal Achievement: 09/19/20 Potential to Achieve Goals: Good  Plan Discharge plan remains appropriate;Frequency remains appropriate    Co-evaluation                 AM-PAC OT "6 Clicks" Daily Activity     Outcome Measure   Help from another person eating meals?: None Help from another person taking care of personal grooming?: A Little Help from another person toileting, which includes using toliet, bedpan, or urinal?: A Little Help from another person bathing (including washing, rinsing, drying)?: A Little Help from another person to put on and taking off regular upper body clothing?: None Help from another person to put on and taking off regular lower body clothing?: A Little 6 Click Score: 20    End of Session Equipment Utilized During Treatment: Gait belt;Rolling walker  OT Visit Diagnosis: Unsteadiness on feet (R26.81);Other abnormalities of gait  and mobility (R26.89);Muscle weakness (generalized) (M62.81);Other symptoms and signs involving cognitive function;Other symptoms and signs involving the nervous system (R29.898);Low vision, both eyes (H54.2)   Activity Tolerance Patient tolerated treatment well   Patient Left in chair;with call bell/phone within reach;with chair alarm set   Nurse Communication Mobility status        Time: 8453-6468 OT Time Calculation (min): 26 min  Charges: OT General Charges $OT Visit: 1 Visit OT Treatments $Self Care/Home Management : 8-22 mins $Therapeutic Activity: 8-22 mins  Harley Alto., COTA/L Acute Rehabilitation Services [place] 09/09/2020, 1:41 PM

## 2020-09-09 NOTE — Progress Notes (Signed)
PT Cancellation Note  Patient Details Name: Gabriel [name] MRN: [id number] DOB: 07/18/1930   Cancelled Treatment:    Reason Eval/Treat Not Completed: Patient at procedure or test/unavailable. Pt currently off unit for radiation treatment. Will check back as schedule allows to continue with PT POC.    Thelma Comp 09/09/2020, 10:44 AM   Rolinda Roan, PT, DPT Acute Rehabilitation Services Pager: [phone] Office: [phone]

## 2020-09-10 ENCOUNTER — Other Ambulatory Visit: Payer: Self-pay

## 2020-09-10 ENCOUNTER — Encounter (HOSPITAL_COMMUNITY): Payer: Self-pay | Admitting: Physical Medicine and Rehabilitation

## 2020-09-10 ENCOUNTER — Ambulatory Visit: Payer: Medicare Other

## 2020-09-10 ENCOUNTER — Inpatient Hospital Stay (HOSPITAL_COMMUNITY)
Admission: RE | Admit: 2020-09-10 | Discharge: 2020-10-02 | DRG: 945 | Disposition: A | Discharge status: Home Health | Payer: Medicare Other | Source: Intra-hospital | Attending: Physical Medicine and Rehabilitation | Admitting: Physical Medicine and Rehabilitation

## 2020-09-10 ENCOUNTER — Ambulatory Visit
Admission: RE | Admit: 2020-09-10 | Discharge: 2020-09-10 | Disposition: A | Discharge status: Home / Self Care | Payer: Medicare Other | Source: Ambulatory Visit | Attending: Radiation Oncology | Admitting: Radiation Oncology

## 2020-09-10 DIAGNOSIS — E559 Vitamin D deficiency, unspecified: Secondary | ICD-10-CM | POA: Diagnosis present

## 2020-09-10 DIAGNOSIS — E785 Hyperlipidemia, unspecified: Secondary | ICD-10-CM | POA: Diagnosis present

## 2020-09-10 DIAGNOSIS — Z809 Family history of malignant neoplasm, unspecified: Secondary | ICD-10-CM

## 2020-09-10 DIAGNOSIS — C7802 Secondary malignant neoplasm of left lung: Secondary | ICD-10-CM | POA: Diagnosis present

## 2020-09-10 DIAGNOSIS — N179 Acute kidney failure, unspecified: Secondary | ICD-10-CM | POA: Diagnosis present

## 2020-09-10 DIAGNOSIS — C7931 Secondary malignant neoplasm of brain: Secondary | ICD-10-CM | POA: Diagnosis present

## 2020-09-10 DIAGNOSIS — Z8673 Personal history of transient ischemic attack (TIA), and cerebral infarction without residual deficits: Secondary | ICD-10-CM | POA: Diagnosis not present

## 2020-09-10 DIAGNOSIS — C439 Malignant melanoma of skin, unspecified: Secondary | ICD-10-CM | POA: Diagnosis present

## 2020-09-10 DIAGNOSIS — K5900 Constipation, unspecified: Secondary | ICD-10-CM | POA: Diagnosis present

## 2020-09-10 DIAGNOSIS — Z79899 Other long term (current) drug therapy: Secondary | ICD-10-CM

## 2020-09-10 DIAGNOSIS — Z7189 Other specified counseling: Secondary | ICD-10-CM | POA: Diagnosis not present

## 2020-09-10 DIAGNOSIS — F419 Anxiety disorder, unspecified: Secondary | ICD-10-CM | POA: Diagnosis present

## 2020-09-10 DIAGNOSIS — G8918 Other acute postprocedural pain: Secondary | ICD-10-CM | POA: Diagnosis not present

## 2020-09-10 DIAGNOSIS — Z7902 Long term (current) use of antithrombotics/antiplatelets: Secondary | ICD-10-CM | POA: Diagnosis not present

## 2020-09-10 DIAGNOSIS — E7800 Pure hypercholesterolemia, unspecified: Secondary | ICD-10-CM | POA: Diagnosis present

## 2020-09-10 DIAGNOSIS — Z66 Do not resuscitate: Secondary | ICD-10-CM | POA: Diagnosis not present

## 2020-09-10 DIAGNOSIS — Z515 Encounter for palliative care: Secondary | ICD-10-CM | POA: Diagnosis not present

## 2020-09-10 DIAGNOSIS — G4700 Insomnia, unspecified: Secondary | ICD-10-CM | POA: Diagnosis present

## 2020-09-10 DIAGNOSIS — R5381 Other malaise: Secondary | ICD-10-CM | POA: Diagnosis present

## 2020-09-10 DIAGNOSIS — I10 Essential (primary) hypertension: Secondary | ICD-10-CM | POA: Diagnosis present

## 2020-09-10 DIAGNOSIS — Z923 Personal history of irradiation: Secondary | ICD-10-CM | POA: Diagnosis not present

## 2020-09-10 DIAGNOSIS — C799 Secondary malignant neoplasm of unspecified site: Secondary | ICD-10-CM | POA: Diagnosis present

## 2020-09-10 DIAGNOSIS — K649 Unspecified hemorrhoids: Secondary | ICD-10-CM | POA: Diagnosis present

## 2020-09-10 DIAGNOSIS — K5901 Slow transit constipation: Secondary | ICD-10-CM | POA: Diagnosis not present

## 2020-09-10 DIAGNOSIS — M549 Dorsalgia, unspecified: Secondary | ICD-10-CM

## 2020-09-10 MED ORDER — LEVETIRACETAM 500 MG PO TABS
500.0000 mg | ORAL_TABLET | Freq: Two times a day (BID) | ORAL | Status: DC
  Administered 2020-09-10 – 2020-09-16 (×12): 500 mg via ORAL
  Filled 2020-09-10 (×11): qty 1

## 2020-09-10 MED ORDER — LORAZEPAM 0.5 MG PO TABS: 0.2500 mg | ORAL_TABLET | Freq: Every day | ORAL | Status: DC | PRN

## 2020-09-10 MED ORDER — LISINOPRIL 10 MG PO TABS
10.0000 mg | ORAL_TABLET | Freq: Every day | ORAL | Status: DC
  Administered 2020-09-11 – 2020-09-17 (×7): 10 mg via ORAL
  Filled 2020-09-10 (×7): qty 1

## 2020-09-10 MED ORDER — ATORVASTATIN CALCIUM 40 MG PO TABS
40.0000 mg | ORAL_TABLET | Freq: Every day | ORAL | Status: DC
  Administered 2020-09-11 – 2020-10-02 (×22): 40 mg via ORAL
  Filled 2020-09-10 (×22): qty 1

## 2020-09-10 MED ORDER — HYDROCODONE-ACETAMINOPHEN 5-325 MG PO TABS: 1.0000 | ORAL_TABLET | ORAL | Status: DC | PRN

## 2020-09-10 MED ORDER — DEXAMETHASONE 2 MG PO TABS: 2.0000 mg | ORAL_TABLET | Freq: Two times a day (BID) | ORAL | Status: DC

## 2020-09-10 MED ORDER — LEVETIRACETAM 500 MG PO TABS
500.0000 mg | ORAL_TABLET | Freq: Two times a day (BID) | ORAL | Status: DC
Start: 2020-09-10 — End: 2020-09-10

## 2020-09-10 MED ORDER — ONDANSETRON HCL 4 MG PO TABS
4.0000 mg | ORAL_TABLET | ORAL | Status: DC | PRN
  Administered 2020-09-22: 4 mg via ORAL
  Filled 2020-09-10: qty 1

## 2020-09-10 MED ORDER — DOCUSATE SODIUM 100 MG PO CAPS
100.0000 mg | ORAL_CAPSULE | Freq: Two times a day (BID) | ORAL | Status: DC
  Administered 2020-09-10 – 2020-09-24 (×25): 100 mg via ORAL
  Filled 2020-09-10 (×27): qty 1

## 2020-09-10 MED ORDER — ACETAMINOPHEN 650 MG RE SUPP: 650.0000 mg | RECTAL | Status: DC | PRN

## 2020-09-10 MED ORDER — ACETAMINOPHEN 325 MG PO TABS
650.0000 mg | ORAL_TABLET | ORAL | Status: DC | PRN
  Administered 2020-09-16 – 2020-09-24 (×8): 650 mg via ORAL
  Filled 2020-09-10 (×9): qty 2

## 2020-09-10 MED ORDER — ONDANSETRON HCL 4 MG/2ML IJ SOLN: 4.0000 mg | INTRAMUSCULAR | Status: DC | PRN

## 2020-09-10 MED ORDER — BISACODYL 10 MG RE SUPP: 10.0000 mg | Freq: Every day | RECTAL | Status: DC | PRN

## 2020-09-10 MED ORDER — HYDROCHLOROTHIAZIDE 12.5 MG PO CAPS
12.5000 mg | ORAL_CAPSULE | Freq: Every day | ORAL | Status: DC
  Administered 2020-09-11 – 2020-09-20 (×10): 12.5 mg via ORAL
  Filled 2020-09-10 (×10): qty 1

## 2020-09-10 MED ORDER — PANTOPRAZOLE SODIUM 40 MG PO TBEC
40.0000 mg | DELAYED_RELEASE_TABLET | Freq: Every day | ORAL | Status: DC
  Administered 2020-09-10 – 2020-10-01 (×22): 40 mg via ORAL
  Filled 2020-09-10 (×22): qty 1

## 2020-09-10 MED ORDER — DEXAMETHASONE 2 MG PO TABS
2.0000 mg | ORAL_TABLET | Freq: Three times a day (TID) | ORAL | Status: DC
  Administered 2020-09-10 – 2020-10-02 (×65): 2 mg via ORAL
  Filled 2020-09-10 (×72): qty 1

## 2020-09-10 MED ORDER — SENNA 8.6 MG PO TABS
1.0000 | ORAL_TABLET | Freq: Two times a day (BID) | ORAL | Status: DC
  Administered 2020-09-10 – 2020-10-02 (×41): 8.6 mg via ORAL
  Filled 2020-09-10 (×43): qty 1

## 2020-09-10 NOTE — PMR Pre-admission (Signed)
PMR Admission Coordinator Pre-Admission Assessment  Patient: Gabriel [name] is an 87 y.o., male MRN: [id number] DOB: 09-02-1930 Height: _0  (177.8 cm) Weight: 87.1 kg  Insurance Information HMO:     PPO: yes     PCP:      IPA:      80/20:      OTHER:  PRIMARY: [employer] Medicare      Policy#: [id number]      Subscriber: pt CM Name: Wilburn Cornelia      Phone#: [phone]     Fax#: [phone] Pre-Cert#: [id number] auth for CIR given by Wilburn Cornelia at [facility] with updates due to fax listed above on 7/12.       Employer:  Benefits:  Phone #: [phone]     Name:  Eff. Date: 04/06/20     Deduct: $0      Out of Pocket Max: $3900 (met $788.29)      Life Max:  CIR: $295/day for days 1-5      SNF: 20 full days Outpatient:      Co-Pay: $20/visit Home Health: 100%      Co-Pay:  DME: 80%     Co-Ins: 20% Providers:  SECONDARY: n/a      Policy#:      Phone#:   Development worker, community:       Phone#:   The "Data Collection Information Summary" for patients in Inpatient Rehabilitation Facilities with attached "Privacy Act [place] Records" was provided and verbally reviewed with: Patient and Family  Emergency Contact Information Contact Information    Name Relation Home Work Mobile   Stonewall H Spouse [phone]  [phone]   Benn Moulder Daughter [phone]     Levon, Boettcher Son   [phone]      Current Medical History  Patient Admitting Diagnosis: brain mets, s/p crani resection  History of Present Illness: Gabriel [name] is a 87 year old right-handed male with history of hyperlipidemia, hypertension, CVA with unsteady gait maintained on Plavix.  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  CT scan revealed left upper lobe mass 7.8 x 6.8 cm in size.  A small left hilar node was also identified.  Additionally there was renal masses concerning for primary renal disease versus metastatic disease.  MRI of the brain  demonstrated 3 intracranial metastases including 2 small subcentimeter metastases and a larger right hemorrhagic lesion.  Patient was established with medical oncology Dr. Mohammed/radiation oncology Dr. Lisbeth Renshaw.  PET scan completed showing hypermetabolic lingular mass with central necrosis consistent with primary bronchogenic carcinoma.  No evidence of mediastinal nodal metastasis.  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  Patient was admitted 09/03/2020 and underwent stereotactic right temporal craniotomy for resection of brain metastasis per Dr. Kathyrn Sheriff.  Surgical pathology report consistent with metastatic melanoma.  Maintained on Keppra for seizure prophylaxis.  Decadron protocol as indicated.  Medical oncology as well as radiation oncology continue to follow with radiation therapy as directed.  Tolerating a regular consistency diet.  No current plan at this time for DVT prophylaxis due to some bleeding hemorrhoids.  Therapy evaluations completed due to patient decreased functional mobility patient was recommended for a comprehensive rehab program.  Complete NIHSS TOTAL: 0  Patient's medical record from [facility] has been reviewed by the rehabilitation admission coordinator and physician.  Past Medical History  Past Medical History:  Diagnosis Date  . Cancer ([place])    melonoma on stomach, lung,  brain  . Gait disorder 02/04/2017   cane   . High cholesterol   . Hypertension   . Pneumonia   . Stroke ([place]) 04/2019 last one   gait unsteady- . uses a cane,2-3 mini strokes    Family History   family history includes Cancer in his brother; Cancer - Prostate in his father; Heart attack in his mother; Heart disease in his father.  Prior Rehab/Hospitalizations Has the patient had prior rehab or hospitalizations prior to admission? No  Has the patient had major surgery during 100 days prior to admission? Yes   Current  Medications  Current Facility-Administered Medications:  .  acetaminophen (TYLENOL) tablet 650 mg, 650 mg, Oral, Q4H PRN, 650 mg at 09/06/20 0520 **OR** acetaminophen (TYLENOL) suppository 650 mg, 650 mg, Rectal, Q4H PRN, [name], MD .  atorvastatin (LIPITOR) tablet 40 mg, 40 mg, Oral, Daily, [name], MD, 40 mg at 09/10/20 0802 .  bisacodyl (DULCOLAX) suppository 10 mg, 10 mg, Rectal, Daily PRN, [name], MD .  [COMPLETED] dexamethasone (DECADRON) tablet 6 mg, 6 mg, Oral, Q6H, 6 mg at 09/04/20 1202 **FOLLOWED BY** [COMPLETED] dexamethasone (DECADRON) tablet 4 mg, 4 mg, Oral, Q6H, 4 mg at 09/05/20 1217 **FOLLOWED BY** dexamethasone (DECADRON) tablet 2 mg, 2 mg, Oral, Q8H, Kary Kos, MD, 2 mg at 09/10/20 4854 .  docusate sodium (COLACE) capsule 100 mg, 100 mg, Oral, BID, [name], MD, 100 mg at 09/10/20 0802 .  hydrochlorothiazide (MICROZIDE) capsule 12.5 mg, 12.5 mg, Oral, Daily, [name], MD, 12.5 mg at 09/10/20 0803 .  HYDROcodone-acetaminophen (NORCO/VICODIN) 5-325 MG per tablet 1 tablet, 1 tablet, Oral, Q4H PRN, [name], MD, 1 tablet at 09/04/20 0541 .  labetalol (NORMODYNE) injection 10-40 mg, 10-40 mg, Intravenous, Q10 min PRN, [name], MD .  levETIRAcetam (KEPPRA) tablet 500 mg, 500 mg, Oral, BID, [name], MD, 500 mg at 09/10/20 0802 .  lisinopril (ZESTRIL) tablet 10 mg, 10 mg, Oral, Daily, [name], MD, 10 mg at 09/10/20 0802 .  LORazepam (ATIVAN) tablet 0.25 mg, 0.25 mg, Oral, Daily PRN, [name], MD .  magnesium citrate solution 1 Bottle, 1 Bottle, Oral, Once PRN, [name], MD .  ondansetron (ZOFRAN) tablet 4 mg, 4 mg, Oral, Q4H PRN **OR** ondansetron (ZOFRAN) injection 4 mg, 4 mg, Intravenous, Q4H PRN, [name], MD .  pantoprazole (PROTONIX) EC tablet 40 mg, 40 mg, Oral, QHS, [name], MD, 40 mg at 09/09/20 2100 .  promethazine (PHENERGAN) tablet 12.5-25  mg, 12.5-25 mg, Oral, Q4H PRN, [name], MD .  senna (SENOKOT) tablet 8.6 mg, 1 tablet, Oral, BID, [name], MD, 8.6 mg at 09/10/20 0803  Patients Current Diet:  Diet Order            Diet Carb Modified Fluid consistency: Thin; Room service appropriate? Yes  Diet effective now                 Precautions / Restrictions Precautions Precautions: Fall Precaution Comments: crani Restrictions Weight Bearing Restrictions: No   Has the patient had 2 or more falls or a fall with injury in the past year? No  Prior Activity Level Community (5-7x/wk): mod I with SPC prior to admission, wife drives mostly, but he does drive some.  Active in the community  Prior Functional Level Self Care: Did the patient need help bathing, dressing, using the toilet or eating? Independent  Indoor Mobility: Did the patient need assistance with walking from room to room (with or without device)? Independent  Stairs: Did the patient need assistance with internal or external stairs (with or without device)? Independent  Functional Cognition: Did the patient need help planning regular tasks such as shopping or remembering to take medications? Independent  Home Assistive Devices / Equipment Home Assistive Devices/Equipment: Blood pressure cuff,Cane (specify quad or straight),Eyeglasses,Scales Home Equipment: Cane - single point  Prior Device Use: Indicate devices/aids used by the patient prior to current illness, exacerbation or injury? single point cane  Current Functional Level Cognition  Overall Cognitive Status: Impaired/Different from baseline Current Attention Level: Selective Orientation Level: Oriented X4 Following Commands: Follows one step commands consistently,Follows multi-step commands with increased time Safety/Judgement: Decreased awareness of safety,Decreased awareness of deficits General Comments: increased cues for safety and body mechanics during balance challenges,  easily distracted and tangential at times    Extremity Assessment (includes Sensation/Coordination)  Upper Extremity Assessment: LUE deficits/detail LUE Coordination:  (mild incoordination noted)  Lower Extremity Assessment: Defer to PT evaluation    ADLs  Overall ADL's : Needs assistance/impaired Eating/Feeding: Set up,Supervision/ safety Eating/Feeding Details (indicate cue type and reason): impulsive during eating with increased spillage. Poured entire bowl of shredded cheese onto pizza, then flipped pizza upside down spilling majority of cheese onto his lap wtih poor awareness Grooming: Wash/dry hands,Standing,Supervision/safety Upper Body Bathing: Set up,Supervision/ safety,Sitting Lower Body Bathing: Minimal assistance,Sit to/from stand Upper Body Dressing : Minimal assistance,Sitting Lower Body Dressing: Moderate assistance,Sit to/from stand Toilet Transfer: Engineer, civil (consulting) and Hygiene: Sit to/from stand,Minimal assistance Functional mobility during ADLs: Min guard,Rolling walker General ADL Comments: pt completing toileting, grooming tasks, dynamic balance tasks and visual scanning tasks    Mobility  Overal bed mobility: Needs Assistance Bed Mobility: Supine to Sit Supine to sit: Min guard,HOB elevated General bed mobility comments: pt received off stretcher coming back from transport to radiation, pt left up in recliner    Transfers  Overall transfer level: Needs assistance Equipment used: Rolling walker (2 wheeled) Transfers: Sit to/from Stand Sit to Stand: Min guard,Min assist General transfer comment: minguard assist to rise for safety, MIN A needed when descending onto sitting surface as pt with decreased recall of reaching back to sitting surface    Ambulation / Gait / Stairs / Wheelchair Mobility  Ambulation/Gait Ambulation/Gait assistance: Min assist,Mod assist Gait Distance (Feet): 75 Feet Assistive  device: 1 person hand held assist Gait Pattern/deviations: Step-through pattern,Decreased stride length,Decreased stance time - left,Decreased dorsiflexion - left,Decreased weight shift to left,Staggering left,Staggering right General Gait Details: pt with wide BOS and heavy reliance on HHA and RLE. Decreased wt acceptance to L, multiple posterior and L lateral LOB requiring modA to recover. Gait velocity: decreased Gait velocity interpretation: 1.31 - 2.62 ft/sec, indicative of limited community ambulator    Posture / Balance Balance Overall balance assessment: Needs assistance Sitting-balance support: Feet supported Sitting balance-Leahy Scale: Good Standing balance support: Bilateral upper extremity supported Standing balance-Leahy Scale: Poor Standing balance comment: reliant on BUE support during standing toe taps activity, pt with poor proprioception unable to complete toe taps wtih eyes occluded High level balance activites: Backward walking,Direction changes,Sudden stops,Other (comment) (weaving, stepping oer objects) High Level Balance Comments: pt with significant increase in assist needed with any dynamic balance challenge. MinA to modA to steady with quick turns, stops, or changes in direction. The pt was also challenged by stepping over or around objects on the ground, and often needed cues to make adjustemnts and to maintain upright while pt made adjustements    Special needs/care consideration Skin  cranial incision and Special service needs radiation (6/7, 6/8, 6/9)    Previous Home Environment (from acute therapy documentation) Living Arrangements: Spouse/significant other Available Help at Discharge: Family,Available 24 hours/day Type of Home: House Home Layout: One level Home Access: Stairs to enter [employer] of Steps: 2 Bathroom Shower/Tub: Multimedia programmer: Standard Bathroom Accessibility: Yes How Accessible: Accessible via walker [place]: No  Discharge Living Setting Plans for Discharge Living Setting: Patient's home,Lives with (comment) (spouse) Type of Home at Discharge: Other (Comment) (townhome) Discharge Home Layout: One level Discharge Home Access: Stairs to enter Entrance Stairs-Rails: Right,Left Entrance Stairs-Number of Steps: curb step +2 Discharge Bathroom Shower/Tub: Walk-in shower Discharge Bathroom Toilet: Standard Discharge Bathroom Accessibility: Yes How Accessible: Accessible via walker Does the patient have any problems obtaining your medications?: No  Social/Family/Support Systems Patient Roles: Spouse Anticipated Caregiver: spouse, Roben Schliep Anticipated Caregiver's Contact Information: [phone] Ability/Limitations of Caregiver: supervision Caregiver Availability: 24/7 Discharge Plan Discussed with Primary Caregiver: Yes Is Caregiver In Agreement with Plan?: Yes Does Caregiver/Family have Issues with Lodging/Transportation while Pt is in Rehab?: No  Goals Patient/Family Goal for Rehab: PT/OT/SLP supervision to mod I Expected length of stay: 12-16 days Additional Information: radiation 6/7, 6/8, and 6/9 Pt/Family Agrees to Admission and willing to participate: Yes Program Orientation Provided & Reviewed with Pt/Caregiver Including Roles  & Responsibilities: Yes Additional Information Needs: Kandice Moos (radiation coordinator) [phone].  She does not actually have the patient but has been my contact.  Decrease burden of Care through IP rehab admission: n/a  Possible need for SNF placement upon discharge: No  Patient Condition: I have reviewed medical records from [facility], spoken with CM, and patient and spouse. I met with patient at the bedside for inpatient rehabilitation assessment.  Patient will benefit from ongoing PT, OT and SLP, can actively participate in 3 hours of therapy a day 5 days of the week, and can make measurable gains during the admission.  Patient will  also benefit from the coordinated team approach during an Inpatient Acute Rehabilitation admission.  The patient will receive intensive therapy as well as Rehabilitation physician, nursing, social worker, and care management interventions.  Due to safety, skin/wound care, disease management, medication administration, pain management and patient education the patient requires 24 hour a day rehabilitation nursing.  The patient is currently min to mod assist +1 with mobility and basic ADLs.  Discharge setting and therapy post discharge at home with home health is anticipated.  Patient has agreed to participate in the Acute Inpatient Rehabilitation Program and will admit today.  Preadmission Screen Completed By:  Michel Santee, PT, DPT 09/10/2020 11:14 AM ______________________________________________________________________   Discussed status with Dr. Dagoberto Ligas on 09/10/20  at 11:20 AM  and received approval for admission today.  Admission Coordinator:  Michel Santee, PT, DPT, time 11:20 AM Sudie Grumbling 09/10/20    Assessment/Plan: Diagnosis: 1. Does the need for close, 24 hr/day Medical supervision in concert with the patient's rehab needs make it unreasonable for this patient to be served in a less intensive setting? Yes 2. Co-Morbidities requiring supervision/potential complications: Stage IV melanoma, S/P R temporal crani; L inattention, HTN, lung mets; hx of multiple TIAs 3. Due to bladder management, bowel management, safety, skin/wound care, disease management, medication administration, pain management and patient education, does the patient require 24 hr/day rehab nursing? Yes 4. Does the patient require coordinated care of a physician, rehab nurse, PT, OT, and SLP to address physical and functional deficits in  the context of the above medical diagnosis(es)? Yes Addressing deficits in the following areas: balance, endurance, locomotion, strength, transferring, bowel/bladder control, bathing, dressing,  feeding, grooming, toileting, cognition and speech 5. Can the patient actively participate in an intensive therapy program of at least 3 hrs of therapy 5 days a week? Yes 6. The potential for patient to make measurable gains while on inpatient rehab is good 7. Anticipated functional outcomes upon discharge from inpatient rehab: modified independent and supervision PT, modified independent and supervision OT, modified independent and supervision SLP 8. Estimated rehab length of stay to reach the above functional goals is: 12-16 days 9. Anticipated discharge destination: Home 10. Overall Rehab/Functional Prognosis: good   MD Signature:

## 2020-09-10 NOTE — Progress Notes (Signed)
Received patient in wheelchair. VSS, respiration even and unlabored, denies pain.

## 2020-09-10 NOTE — Progress Notes (Signed)
  NEUROSURGERY PROGRESS NOTE   No issues overnight. Minimal HA, feels well.  EXAM:  BP 121/67 (BP Location: Right Arm)   Pulse 73   Temp (!) 97.4 F (36.3 C) (Oral)   Resp 18   Ht $R'5\' 10"'Cg$  (1.778 m)   Wt 87.1 kg   SpO2 100%   BMI 27.55 kg/m   Awake, alert, oriented  Speech fluent, appropriate  CN grossly intact  5/5 BUE/BLE   IMPRESSION:  87 y.o. male POD# 7 s/p right crani for melanoma met resection, doing well  PLAN: - CIR today - Cont outpatient chest radiation   [name], MD [facility]

## 2020-09-10 NOTE — H&P (Signed)
Physical Medicine and Rehabilitation Admission H&P     HPI: Gabriel [name] is a 87 year old right-handed male with history of hyperlipidemia, hypertension, CVA with unsteady gait maintained on Plavix.  Per chart review patient lives with spouse.  Independent with assistive device.  1 level home 2 steps to entry.  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  CT scan revealed left upper lobe mass 7.8 x 6.8 cm in size.  A small left hilar node was also identified.  Additionally there was renal masses concerning for primary renal disease versus metastatic disease.  MRI of the brain demonstrated 3 intracranial metastases including 2 small subcentimeter metastases and a larger right hemorrhagic lesion.  Patient was established with medical oncology Dr. Mohammed/radiation oncology Dr. Lisbeth Renshaw.  PET scan completed showing hypermetabolic lingular mass with central necrosis consistent with primary bronchogenic carcinoma.  No evidence of mediastinal nodal metastasis.  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  Patient was admitted 09/03/2020 and underwent stereotactic right temporal craniotomy for resection of brain metastasis per Dr. Kathyrn Sheriff.  Surgical pathology report consistent with metastatic melanoma.  Maintained on Keppra for seizure prophylaxis.  Decadron protocol as indicated.  Medical oncology as well as radiation oncology continue to follow with radiation therapy as directed.  Tolerating a regular consistency diet.  No current plan at this time for DVT prophylaxis due to some bleeding hemorrhoids.  Therapy evaluations completed due to patient decreased functional mobility patient was admitted for a comprehensive rehab program.  Pt reports LBM was last night/overnight. Denies pain since the first day- had a mild HA the first day, but took tylenol- hasn't taken anything for pain since.   Voiding well.   Review of Systems  Constitutional: Positive for malaise/fatigue. Negative for chills and fever.  HENT: Negative for hearing loss.   Eyes: Negative for blurred vision and double vision.  Respiratory: Negative for cough and shortness of breath.   Cardiovascular: Negative for chest pain, palpitations and leg swelling.  Gastrointestinal: Positive for constipation. Negative for heartburn, nausea and vomiting.  Genitourinary: Negative for dysuria and hematuria.  Musculoskeletal: Positive for joint pain and myalgias.  Skin: Negative for rash.  Neurological: Positive for weakness.  All other systems reviewed and are negative.  Past Medical History:  Diagnosis Date  . Cancer ([place])    melonoma on stomach, lung, brain  . Gait disorder 02/04/2017   cane   . High cholesterol   . Hypertension   . Pneumonia   . Stroke ([place]) 04/2019 last one   gait unsteady- . uses a cane,2-3 mini strokes   Past Surgical History:  Procedure Laterality Date  . APPLICATION OF CRANIAL NAVIGATION N/A 09/03/2020   Procedure: APPLICATION OF CRANIAL NAVIGATION;  Surgeon: [name], MD;  Location: [facility];  Service: Neurosurgery;  Laterality: N/A;  RM 20  . BRONCHIAL BIOPSY  08/20/2020   Procedure: BRONCHIAL BIOPSIES;  Surgeon: Garner Nash, DO;  Location: [facility];  Service: Pulmonary;;  . BRONCHIAL BRUSHINGS  08/20/2020   Procedure: BRONCHIAL BRUSHINGS;  Surgeon: Garner Nash, DO;  Location: [facility];  Service: Pulmonary;;  . BRONCHIAL NEEDLE ASPIRATION BIOPSY  08/20/2020   Procedure: BRONCHIAL NEEDLE ASPIRATION BIOPSIES;  Surgeon: Garner Nash, DO;  Location: [facility];  Service: Pulmonary;;  . BRONCHIAL WASHINGS  08/20/2020   Procedure: BRONCHIAL WASHINGS;  Surgeon: Garner Nash, DO;  Location: [facility];  Service: Pulmonary;;  .  CRANIOTOMY Right 09/03/2020   Procedure: Stereotactic right temporal craniotomy for resection of tumor;  Surgeon: [name], MD;   Location: [facility];  Service: Neurosurgery;  Laterality: Right;  . HEMOSTASIS CONTROL  08/20/2020   Procedure: HEMOSTASIS CONTROL;  Surgeon: Garner Nash, DO;  Location: [facility];  Service: Pulmonary;;  . VIDEO BRONCHOSCOPY WITH ENDOBRONCHIAL NAVIGATION Left 08/20/2020   Procedure: VIDEO BRONCHOSCOPY WITH ENDOBRONCHIAL NAVIGATION;  Surgeon: Garner Nash, DO;  Location: [place] [facility];  Service: Pulmonary;  Laterality: Left;  Marland Kitchen VIDEO BRONCHOSCOPY WITH ENDOBRONCHIAL ULTRASOUND Left 08/20/2020   Procedure: VIDEO BRONCHOSCOPY WITH ENDOBRONCHIAL ULTRASOUND;  Surgeon: Garner Nash, DO;  Location: [facility];  Service: Pulmonary;  Laterality: Left;   Family History  Problem Relation Age of Onset  . Heart attack Mother   . Heart disease Father   . Cancer - Prostate Father   . Cancer Brother        renal   Social History:  reports that he has never smoked. He has never used smokeless tobacco. He reports current alcohol use of about 1.0 standard drink of alcohol per week. He reports that he does not use drugs. Allergies: No Known Allergies Medications Prior to Admission  Medication Sig Dispense Refill  . atorvastatin (LIPITOR) 40 MG tablet Take 1 tablet (40 mg total) by mouth daily. 45 tablet 1  . dexamethasone (DECADRON) 4 MG tablet Take 1 tablet (4 mg total) by mouth 4 (four) times daily. (Patient taking differently: Take 4 mg by mouth 4 (four) times daily. Takes 3x a day) 40 tablet 0  . hydrochlorothiazide (MICROZIDE) 12.5 MG capsule Take 12.5 mg by mouth daily.    Marland Kitchen HYDROcodone-acetaminophen (NORCO/VICODIN) 5-325 MG tablet Take 1 tablet by mouth daily as needed for moderate pain or severe pain.    Marland Kitchen lisinopril (PRINIVIL,ZESTRIL) 10 MG tablet Take 10 mg by mouth daily.  4  . LORazepam (ATIVAN) 0.5 MG tablet Take 0.5 tablets (0.25 mg total) by mouth as directed. 1/2 tab po 30 minutes prior to radiation or MRI scans 30 tablet 0  . clopidogrel (PLAVIX) 75 MG tablet Take 1 tablet (75 mg  total) by mouth daily. 90 tablet 0    Drug Regimen Review Drug regimen was reviewed and remains appropriate with no significant issues identified  Home: Home Living Family/patient expects to be discharged to:: Private residence Living Arrangements: Spouse/significant other Available Help at Discharge: Family,Available 24 hours/day Type of Home: House Home Access: Stairs to enter [employer] of Steps: 2 Home Layout: One level Bathroom Shower/Tub: Multimedia programmer: Standard Bathroom Accessibility: Yes Home Equipment: Cane - single point   Functional History: Prior Function Level of Independence: Independent with assistive device(s) Comments: Began using a cane recently; Was working selling life ins and drove. Wife recently started driving  Functional Status:  Mobility: Bed Mobility Overal bed mobility: Needs Assistance Bed Mobility: Supine to Sit Supine to sit: Min guard,HOB elevated General bed mobility comments: pt received off stretcher coming back from transport to radiation, pt left up in recliner Transfers Overall transfer level: Needs assistance Equipment used: Rolling walker (2 wheeled) Transfers: Sit to/from Stand Sit to Stand: Min guard,Min assist General transfer comment: minguard assist to rise for safety, MIN A needed when descending onto sitting surface as pt with decreased recall of reaching back to sitting surface Ambulation/Gait Ambulation/Gait assistance: Min assist,Mod assist Gait Distance (Feet): 75 Feet Assistive device: 1 person hand held assist Gait Pattern/deviations: Step-through pattern,Decreased stride length,Decreased stance time - left,Decreased dorsiflexion -  left,Decreased weight shift to left,Staggering left,Staggering right General Gait Details: pt with wide BOS and heavy reliance on HHA and RLE. Decreased wt acceptance to L, multiple posterior and L lateral LOB requiring modA to recover. Gait velocity:  decreased Gait velocity interpretation: 1.31 - 2.62 ft/sec, indicative of limited community ambulator    ADL: ADL Overall ADL's : Needs assistance/impaired Eating/Feeding: Set up,Supervision/ safety Eating/Feeding Details (indicate cue type and reason): impulsive during eating with increased spillage. Poured entire bowl of shredded cheese onto pizza, then flipped pizza upside down spilling majority of cheese onto his lap wtih poor awareness Grooming: Wash/dry hands,Standing,Supervision/safety Upper Body Bathing: Set up,Supervision/ safety,Sitting Lower Body Bathing: Minimal assistance,Sit to/from stand Upper Body Dressing : Minimal assistance,Sitting Lower Body Dressing: Moderate assistance,Sit to/from stand Toilet Transfer: Engineer, civil (consulting) and Hygiene: Sit to/from stand,Minimal assistance Functional mobility during ADLs: Min guard,Rolling walker General ADL Comments: pt completing toileting, grooming tasks, dynamic balance tasks and visual scanning tasks  Cognition: Cognition Overall Cognitive Status: Impaired/Different from baseline Orientation Level: Oriented X4 Cognition Arousal/Alertness: Awake/alert Behavior During Therapy:  (tangential at times) Overall Cognitive Status: Impaired/Different from baseline Area of Impairment: Attention,Safety/judgement,Awareness,Problem solving,Memory,Following commands Current Attention Level: Selective Memory: Decreased short-term memory (repeated cues for safety) Following Commands: Follows one step commands consistently,Follows multi-step commands with increased time Safety/Judgement: Decreased awareness of safety,Decreased awareness of deficits Awareness: Emergent Problem Solving: Slow processing,Difficulty sequencing,Requires verbal cues General Comments: increased cues for safety and body mechanics during balance challenges, easily distracted and tangential at times  Physical  Exam: Blood pressure 108/70, pulse 69, temperature (!) 97.4 F (36.3 C), temperature source Oral, resp. rate 20, height 5\' 10"  (1.778 m), weight 87.1 kg, SpO2 99 %. Physical Exam Vitals and nursing note reviewed. Exam conducted with a chaperone present.  Constitutional:      Comments: Pt is sitting up in bedside chair with wife- playing cards, appropriate, NAD  HENT:     Head: Normocephalic.     Comments: R temporal craniotomy- with staples intact- no drainage; no erythema Smile equal; tongue midline Facial sensation intact    Right Ear: External ear normal.     Left Ear: External ear normal.     Nose: Nose normal. No congestion.     Mouth/Throat:     Mouth: Mucous membranes are dry.     Pharynx: Oropharynx is clear. No oropharyngeal exudate.  Eyes:     General:        Right eye: No discharge.        Left eye: No discharge.     Extraocular Movements: Extraocular movements intact.     Comments: No nystagmus  Cardiovascular:     Rate and Rhythm: Normal rate and regular rhythm.     Heart sounds: Normal heart sounds. No murmur heard. No gallop.   Pulmonary:     Comments: CTA B/L- no W/R/R- good air movement Abdominal:     Comments: Soft, NT, ND, (+)BS  - slightly hypoactive  Musculoskeletal:     Cervical back: Normal range of motion and neck supple.     Comments: RUE- 5/5 except finger abd 5-/5 LUE- 5/5 except finger abd 4+/5 RLE- HF 4+/5, KE 4/5, DF and PF 4-/5 LLE- HF 4-/5, KE 4-/5, DF 2-/5 and PF 2-/5  Skin:    General: Skin is warm and dry.     Comments: IV R hand- otherwise no skin breakdown seen  (R crani noted above)  Neurological:     Comments: Patient is alert.  Up ambulating to the bathroom with nursing assistance.  Makes eye contact with examiner.  Provides name and age.  Follows simple commands. L inattention noted on exam Intact to light touch in all 4 extremities- Slightly tangential in topics- also slightly delayed in responses Mild slowed finger to nose on L  only  Psychiatric:        Mood and Affect: Mood normal.        Behavior: Behavior normal.     No results found for this or any previous visit (from the past 48 hour(s)). No results found.     Medical Problem List and Plan: 1.  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  Status post stereotactic right temporal craniotomy resection of brain metastasis 09/03/2020.  Follow-up medical oncology as well as radiation oncology with radiation therapy as directed.  Decadron protocol  -patient may not shower until staples out- can try to wash hair carefully by OT  -ELOS/Goals: 12-16 days supervision 2.  Antithrombotics: -DVT/anticoagulation: SCDs.  -antiplatelet therapy: N/A 3. Pain Management: Hydrocodone as needed- mainly taking tylenol per pt-  4. Mood: Ativan 0.25 mg daily as needed prior to radiation and scans  -antipsychotic agents: N/A 5. Neuropsych: This patient is capable of making decisions on his own behalf. 6. Skin/Wound Care: Routine skin checks 7. Fluids/Electrolytes/Nutrition: Routine in and outs with follow-up chemistries 8.  Seizure prophylaxis.  Keppra 500 mg twice daily 9.  Hypertension.  Lisinopril 10 mg daily, HCTZ 12.5 mg daily.  Monitor with increased mobility 10.  Hyperlipidemia.  Lipitor 11.  Constipation.  Senokot twice daily, Colace twice daily, Dulcolax suppository daily as needed     Cathlyn Parsons, PA-C 09/10/2020    I have personally performed a face to face diagnostic evaluation of this patient and formulated the key components of the plan.  Additionally, I have personally reviewed laboratory data, imaging studies, as well as relevant notes and concur with the physician assistant's documentation above.

## 2020-09-10 NOTE — H&P (Signed)
Physical Medicine and Rehabilitation Admission H&P       HPI: Gabriel [name] is a 87 year old right-handed male with history of hyperlipidemia, hypertension, CVA with unsteady gait maintained on Plavix.  Per chart review patient lives with spouse.  Independent with assistive device.  1 level home 2 steps to entry.  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  CT scan revealed left upper lobe mass 7.8 x 6.8 cm in size.  A small left hilar node was also identified.  Additionally there was renal masses concerning for primary renal disease versus metastatic disease.  MRI of the brain demonstrated 3 intracranial metastases including 2 small subcentimeter metastases and a larger right hemorrhagic lesion.  Patient was established with medical oncology Dr. Mohammed/radiation oncology Dr. Lisbeth Renshaw.  PET scan completed showing hypermetabolic lingular mass with central necrosis consistent with primary bronchogenic carcinoma.  No evidence of mediastinal nodal metastasis.  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  Patient was admitted 09/03/2020 and underwent stereotactic right temporal craniotomy for resection of brain metastasis per Dr. Kathyrn Sheriff.  Surgical pathology report consistent with metastatic melanoma.  Maintained on Keppra for seizure prophylaxis.  Decadron protocol as indicated.  Medical oncology as well as radiation oncology continue to follow with radiation therapy as directed.  Tolerating a regular consistency diet.  No current plan at this time for DVT prophylaxis due to some bleeding hemorrhoids.  Therapy evaluations completed due to patient decreased functional mobility patient was admitted for a comprehensive rehab program.   Pt reports LBM was last night/overnight. Denies pain since the first day- had a mild HA the first day, but took tylenol- hasn't taken anything for pain  since.  Voiding well.    Review of Systems  Constitutional: Positive for malaise/fatigue. Negative for chills and fever.  HENT: Negative for hearing loss.   Eyes: Negative for blurred vision and double vision.  Respiratory: Negative for cough and shortness of breath.   Cardiovascular: Negative for chest pain, palpitations and leg swelling.  Gastrointestinal: Positive for constipation. Negative for heartburn, nausea and vomiting.  Genitourinary: Negative for dysuria and hematuria.  Musculoskeletal: Positive for joint pain and myalgias.  Skin: Negative for rash.  Neurological: Positive for weakness.  All other systems reviewed and are negative.       Past Medical History:  Diagnosis Date  . Cancer ([place])      melonoma on stomach, lung, brain  . Gait disorder 02/04/2017    cane   . High cholesterol    . Hypertension    . Pneumonia    . Stroke ([place]) 04/2019 last one    gait unsteady- . uses a cane,2-3 mini strokes         Past Surgical History:  Procedure Laterality Date  . APPLICATION OF CRANIAL NAVIGATION N/A 09/03/2020    Procedure: APPLICATION OF CRANIAL NAVIGATION;  Surgeon: [name], MD;  Location: [facility];  Service: Neurosurgery;  Laterality: N/A;  RM 20  . BRONCHIAL BIOPSY   08/20/2020    Procedure: BRONCHIAL BIOPSIES;  Surgeon: Garner Nash, DO;  Location: [facility];  Service: Pulmonary;;  . BRONCHIAL BRUSHINGS   08/20/2020    Procedure: BRONCHIAL BRUSHINGS;  Surgeon: Garner Nash, DO;  Location: [facility];  Service: Pulmonary;;  . BRONCHIAL NEEDLE ASPIRATION BIOPSY   08/20/2020    Procedure: BRONCHIAL NEEDLE ASPIRATION BIOPSIES;  Surgeon: Garner Nash, DO;  Location: [facility];  Service: Pulmonary;;  . BRONCHIAL WASHINGS   08/20/2020    Procedure: BRONCHIAL WASHINGS;  Surgeon: Garner Nash, DO;  Location: [place];  Service: Pulmonary;;  . CRANIOTOMY Right 09/03/2020    Procedure: Stereotactic right temporal craniotomy for resection of  tumor;  Surgeon: [name], MD;  Location: [place];  Service: Neurosurgery;  Laterality: Right;  . HEMOSTASIS CONTROL   08/20/2020    Procedure: HEMOSTASIS CONTROL;  Surgeon: Garner Nash, DO;  Location: [facility];  Service: Pulmonary;;  . VIDEO BRONCHOSCOPY WITH ENDOBRONCHIAL NAVIGATION Left 08/20/2020    Procedure: VIDEO BRONCHOSCOPY WITH ENDOBRONCHIAL NAVIGATION;  Surgeon: Garner Nash, DO;  Location: [place];  Service: Pulmonary;  Laterality: Left;  Marland Kitchen VIDEO BRONCHOSCOPY WITH ENDOBRONCHIAL ULTRASOUND Left 08/20/2020    Procedure: VIDEO BRONCHOSCOPY WITH ENDOBRONCHIAL ULTRASOUND;  Surgeon: Garner Nash, DO;  Location: [place];  Service: Pulmonary;  Laterality: Left;    Family History  Problem Relation Age of Onset  . Heart attack Mother    . Heart disease Father    . Cancer - Prostate Father    . Cancer Brother          renal    Social History:  reports that he has never smoked. He has never used smokeless tobacco. He reports current alcohol use of about 1.0 standard drink of alcohol per week. He reports that he does not use drugs. Allergies: No Known Allergies       Medications Prior to Admission  Medication Sig Dispense Refill  . atorvastatin (LIPITOR) 40 MG tablet Take 1 tablet (40 mg total) by mouth daily. 45 tablet 1  . dexamethasone (DECADRON) 4 MG tablet Take 1 tablet (4 mg total) by mouth 4 (four) times daily. (Patient taking differently: Take 4 mg by mouth 4 (four) times daily. Takes 3x a day) 40 tablet 0  . hydrochlorothiazide (MICROZIDE) 12.5 MG capsule Take 12.5 mg by mouth daily.      Marland Kitchen HYDROcodone-acetaminophen (NORCO/VICODIN) 5-325 MG tablet Take 1 tablet by mouth daily as needed for moderate pain or severe pain.      Marland Kitchen lisinopril (PRINIVIL,ZESTRIL) 10 MG tablet Take 10 mg by mouth daily.   4  . LORazepam (ATIVAN) 0.5 MG tablet Take 0.5 tablets (0.25 mg total) by mouth as directed. 1/2 tab po 30 minutes prior to radiation or MRI scans 30 tablet  0  . clopidogrel (PLAVIX) 75 MG tablet Take 1 tablet (75 mg total) by mouth daily. 90 tablet 0      Drug Regimen Review Drug regimen was reviewed and remains appropriate with no significant issues identified   Home: Home Living Family/patient expects to be discharged to:: Private residence Living Arrangements: Spouse/significant other Available Help at Discharge: Family,Available 24 hours/day Type of Home: House Home Access: Stairs to enter [employer] of Steps: 2 Home Layout: One level Bathroom Shower/Tub: Multimedia programmer: Standard Bathroom Accessibility: Yes Home Equipment: Cane - single point   Functional History: Prior Function Level of Independence: Independent with assistive device(s) Comments: Began using a cane recently; Was working selling life ins and drove. Wife recently started driving   Functional Status:  Mobility: Bed Mobility Overal bed mobility: Needs Assistance Bed Mobility: Supine to Sit Supine to sit: Min guard,HOB elevated General bed mobility comments: pt received off stretcher coming back from transport to radiation, pt left up in recliner Transfers Overall transfer level: Needs assistance Equipment used: Rolling walker (2 wheeled) Transfers: Sit to/from Stand Sit to  Stand: Min guard,Min assist General transfer comment: minguard assist to rise for safety, MIN A needed when descending onto sitting surface as pt with decreased recall of reaching back to sitting surface Ambulation/Gait Ambulation/Gait assistance: Min assist,Mod assist Gait Distance (Feet): 75 Feet Assistive device: 1 person hand held assist Gait Pattern/deviations: Step-through pattern,Decreased stride length,Decreased stance time - left,Decreased dorsiflexion - left,Decreased weight shift to left,Staggering left,Staggering right General Gait Details: pt with wide BOS and heavy reliance on HHA and RLE. Decreased wt acceptance to L, multiple posterior and L  lateral LOB requiring modA to recover. Gait velocity: decreased Gait velocity interpretation: 1.31 - 2.62 ft/sec, indicative of limited community ambulator   ADL: ADL Overall ADL's : Needs assistance/impaired Eating/Feeding: Set up,Supervision/ safety Eating/Feeding Details (indicate cue type and reason): impulsive during eating with increased spillage. Poured entire bowl of shredded cheese onto pizza, then flipped pizza upside down spilling majority of cheese onto his lap wtih poor awareness Grooming: Wash/dry hands,Standing,Supervision/safety Upper Body Bathing: Set up,Supervision/ safety,Sitting Lower Body Bathing: Minimal assistance,Sit to/from stand Upper Body Dressing : Minimal assistance,Sitting Lower Body Dressing: Moderate assistance,Sit to/from stand Toilet Transfer: Engineer, civil (consulting) and Hygiene: Sit to/from stand,Minimal assistance Functional mobility during ADLs: Min guard,Rolling walker General ADL Comments: pt completing toileting, grooming tasks, dynamic balance tasks and visual scanning tasks   Cognition: Cognition Overall Cognitive Status: Impaired/Different from baseline Orientation Level: Oriented X4 Cognition Arousal/Alertness: Awake/alert Behavior During Therapy:  (tangential at times) Overall Cognitive Status: Impaired/Different from baseline Area of Impairment: Attention,Safety/judgement,Awareness,Problem solving,Memory,Following commands Current Attention Level: Selective Memory: Decreased short-term memory (repeated cues for safety) Following Commands: Follows one step commands consistently,Follows multi-step commands with increased time Safety/Judgement: Decreased awareness of safety,Decreased awareness of deficits Awareness: Emergent Problem Solving: Slow processing,Difficulty sequencing,Requires verbal cues General Comments: increased cues for safety and body mechanics during balance challenges,  easily distracted and tangential at times   Physical Exam: Blood pressure 108/70, pulse 69, temperature (!) 97.4 F (36.3 C), temperature source Oral, resp. rate 20, height 5\' 10"  (1.778 m), weight 87.1 kg, SpO2 99 %. Physical Exam Vitals and nursing note reviewed. Exam conducted with a chaperone present.  Constitutional:      Comments: Pt is sitting up in bedside chair with wife- playing cards, appropriate, NAD  HENT:     Head: Normocephalic.     Comments: R temporal craniotomy- with staples intact- no drainage; no erythema Smile equal; tongue midline Facial sensation intact    Right Ear: External ear normal.     Left Ear: External ear normal.     Nose: Nose normal. No congestion.     Mouth/Throat:     Mouth: Mucous membranes are dry.     Pharynx: Oropharynx is clear. No oropharyngeal exudate.  Eyes:     General:        Right eye: No discharge.        Left eye: No discharge.     Extraocular Movements: Extraocular movements intact.     Comments: No nystagmus  Cardiovascular:     Rate and Rhythm: Normal rate and regular rhythm.     Heart sounds: Normal heart sounds. No murmur heard. No gallop.   Pulmonary:     Comments: CTA B/L- no W/R/R- good air movement Abdominal:     Comments: Soft, NT, ND, (+)BS  - slightly hypoactive  Musculoskeletal:     Cervical back: Normal range of motion and neck supple.     Comments: RUE- 5/5 except finger abd 5-/5 LUE-  5/5 except finger abd 4+/5 RLE- HF 4+/5, KE 4/5, DF and PF 4-/5 LLE- HF 4-/5, KE 4-/5, DF 2-/5 and PF 2-/5  Skin:    General: Skin is warm and dry.     Comments: IV R hand- otherwise no skin breakdown seen  (R crani noted above)  Neurological:     Comments: Patient is alert.  Up ambulating to the bathroom with nursing assistance.  Makes eye contact with examiner.  Provides name and age.  Follows simple commands. L inattention noted on exam Intact to light touch in all 4 extremities- Slightly tangential in topics- also  slightly delayed in responses Mild slowed finger to nose on L only  Psychiatric:        Mood and Affect: Mood normal.        Behavior: Behavior normal.        Lab Results Last 48 Hours  No results found for this or any previous visit (from the past 48 hour(s)).   Imaging Results (Last 48 hours)  No results found.           Medical Problem List and Plan: 1.  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  Status post stereotactic right temporal craniotomy resection of brain metastasis 09/03/2020.  Follow-up medical oncology as well as radiation oncology with radiation therapy as directed.  Decadron protocol             -patient may not shower until staples out- can try to wash hair carefully by OT             -ELOS/Goals: 12-16 days supervision 2.  Antithrombotics: -DVT/anticoagulation: SCDs.             -antiplatelet therapy: N/A 3. Pain Management: Hydrocodone as needed- mainly taking tylenol per pt-  4. Mood: Ativan 0.25 mg daily as needed prior to radiation and scans             -antipsychotic agents: N/A 5. Neuropsych: This patient is capable of making decisions on his own behalf. 6. Skin/Wound Care: Routine skin checks 7. Fluids/Electrolytes/Nutrition: Routine in and outs with follow-up chemistries 8.  Seizure prophylaxis.  Keppra 500 mg twice daily 9.  Hypertension.  Lisinopril 10 mg daily, HCTZ 12.5 mg daily.  Monitor with increased mobility 10.  Hyperlipidemia.  Lipitor 11.  Constipation.  Senokot twice daily, Colace twice daily, Dulcolax suppository daily as needed         Cathlyn Parsons, PA-C 09/10/2020      I have personally performed a face to face diagnostic evaluation of this patient and formulated the key components of the plan.  Additionally, I have personally reviewed laboratory data, imaging studies, as well as relevant notes and concur with the physician assistant's documentation above.

## 2020-09-10 NOTE — Progress Notes (Signed)
Inpatient Rehab Admissions Coordinator:   I have a bed available for pt to admit to CIR today.  Dr. Kathyrn Sheriff in agreement.  Will let pt/family and TOC team know.   Shann Medal, PT, DPT Admissions Coordinator [phone] 09/10/20  11:08 AM

## 2020-09-10 NOTE — Progress Notes (Signed)
Inpatient Rehabilitation Medication Review by a Pharmacist  A complete drug regimen review was completed for this patient to identify any potential clinically significant medication issues.  Clinically significant medication issues were identified:  yes   Type of Medication Issue Identified Description of Issue Urgent (address now) Non-Urgent (address on AM team rounds) Plan Plan Accepted by Provider? (Yes / No / Pending AM Rounds)  Drug Interaction(s) (clinically significant)       Duplicate Therapy       Allergy       No Medication Administration End Date  Dexamethasone 2mg  Q8H; initially ordered 5/13 x10d as outpt, cont'd on admission w/ taper started 6/4, no end date on order and no clear end date intended by oncology and neurosurgery. Non-urgent    Incorrect Dose       Additional Drug Therapy Needed       Other         Name of provider notified for urgent issues identified: await am  Pharmacist comments: Will need to clarify LOT for dexamethasone (vs whether further taper required for long-term administration).  Time spent performing this drug regimen review (minutes):  58min   Wynona Neat, PharmD, BCPS  09/10/2020 8:29 PM

## 2020-09-10 NOTE — Discharge Summary (Signed)
Physician Discharge Summary  Patient ID: Gabriel [name] MRN: [id number] DOB/AGE: 09/11/1930 87 y.o.  Admit date: 09/03/2020 Discharge date: 09/10/2020  Admission Diagnoses:  Brain metastasis  Discharge Diagnoses:  Same Active Problems:   Brain metastasis [facility])   Discharged Condition: Stable  Hospital Course:  Gabriel [name] is a 87 y.o. male with a history of metastatic melanoma who underwent MRI scan of the brain demonstrating a large right temporal metastasis as well as smaller frontal metastases.  His case was discussed at the neuro-oncology conference where consensus opinion was to undergo stereotactic radiosurgery followed by resection of the larger metastasis.  Patient underwent right temporal craniotomy for resection of the tumor on 546 without complication.  He was at neurologic baseline postop.  He was monitored in the intensive care unit followed by the general neuroscience floor and was doing well.  He continued with his outpatient chest radiation.  He was evaluated by physical and Occupational Therapy and was found to be a good candidate for comprehensive inpatient rehab.  He was therefore discharged in stable condition.  Treatments: Surgery -right temporal craniotomy for resection of tumor  Discharge Exam: Blood pressure 107/76, pulse 73, temperature 98.5 F (36.9 C), temperature source Oral, resp. rate 18, height 5\' 10"  (1.778 m), weight 87.1 kg, SpO2 97 %. Awake, alert, oriented Speech fluent, appropriate CN grossly intact 5/5 BUE/BLE Wound c/d/i  Wound Care: Can d/c staples after 09/17/20  Disposition: Discharge disposition: 02-Transferred to [facility]       Discharge Instructions    Call MD for:  redness, tenderness, or signs of infection (pain, swelling, redness, odor or green/yellow discharge around incision site)   Complete by: As directed    Call MD for:  temperature >100.4   Complete by: As directed    Diet - low sodium heart healthy    Complete by: As directed    Discharge instructions   Complete by: As directed    Walk at home as much as possible, at least 4 times / day   Discharge wound care:   Complete by: As directed    Staples can be removed after 6/14   Increase activity slowly   Complete by: As directed    Lifting restrictions   Complete by: As directed    No lifting > 10 lbs   May shower / Bathe   Complete by: As directed    48 hours after surgery   May walk up steps   Complete by: As directed    Other Restrictions   Complete by: As directed    No bending/twisting at waist     Allergies as of 09/10/2020   No Known Allergies     Medication List    TAKE these medications   atorvastatin 40 MG tablet Commonly known as: LIPITOR Take 1 tablet (40 mg total) by mouth daily.   clopidogrel 75 MG tablet Commonly known as: PLAVIX Take 1 tablet (75 mg total) by mouth daily.   dexamethasone 2 MG tablet Commonly known as: DECADRON Take 1 tablet (2 mg total) by mouth 2 (two) times daily. What changed:   medication strength  how much to take  when to take this   hydrochlorothiazide 12.5 MG capsule Commonly known as: MICROZIDE Take 12.5 mg by mouth daily.   HYDROcodone-acetaminophen 5-325 MG tablet Commonly known as: NORCO/VICODIN Take 1 tablet by mouth daily as needed for moderate pain or severe pain.   levETIRAcetam 500 MG tablet Commonly known as: KEPPRA Take  1 tablet (500 mg total) by mouth 2 (two) times daily.   lisinopril 10 MG tablet Commonly known as: ZESTRIL Take 10 mg by mouth daily.   LORazepam 0.5 MG tablet Commonly known as: Ativan Take 0.5 tablets (0.25 mg total) by mouth as directed. 1/2 tab po 30 minutes prior to radiation or MRI scans            Discharge Care Instructions  (From admission, onward)         Start     Ordered   09/10/20 0000  Discharge wound care:       Comments: Jodell Cipro can be removed after 6/14   09/10/20 1126          Follow-up Information     [name], MD Follow up.   Specialty: Neurosurgery Contact information: [address] [place] [address] [phone]               Signed: Jairo Ben 09/10/2020, 11:27 AM

## 2020-09-10 NOTE — Progress Notes (Signed)
PMR Admission Coordinator Pre-Admission Assessment  Patient: Gabriel [name] is an 87 y.o., male MRN: [id number] DOB: 05/24/1930 Height: 5' 10" (177.8 cm) Weight: 87.1 kg  Insurance Information HMO:     PPO: yes     PCP:      IPA:      80/20:      OTHER:  PRIMARY: [employer] Medicare      Policy#: [id number]      Subscriber: pt CM Name: Gabriel [name]      Phone#: [phone]     Fax#: [phone] Pre-Cert#: [id number] auth for CIR given by Gabriel [name] at [facility] with updates due to fax listed above on 7/12.       Employer:  Benefits:  Phone #: [phone]     Name:  Eff. Date: 04/06/20     Deduct: $0      Out of Pocket Max: $3900 (met $788.29)      Life Max:  CIR: $295/day for days 1-5      SNF: 20 full days Outpatient:      Co-Pay: $20/visit Home Health: 100%      Co-Pay:  DME: 80%     Co-Ins: 20% Providers:  SECONDARY: n/a      Policy#:      Phone#:   Financial Counselor:       Phone#:   The "Data Collection Information Summary" for patients in Inpatient Rehabilitation Facilities with attached "Privacy Act Statement-Health Care Records" was provided and verbally reviewed with: Patient and Family  Emergency Contact Information Contact Information    Name Relation Home Work Mobile   [name],Gabriel H Spouse [phone]  [phone]   [name], Gabriel Daughter [phone]     [name], Gabriel Son   [phone]      Current Medical History  Patient Admitting Diagnosis: brain mets, s/p crani resection  History of Present Illness: Gabriel [name] is a 87-year-old right-handed male with history of hyperlipidemia, hypertension, CVA with unsteady gait maintained on Plavix.  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  CT scan revealed left upper lobe mass 7.8 x 6.8 cm in size.  A small left hilar node was also identified.  Additionally there was renal masses concerning for primary renal disease versus metastatic disease.  MRI of the brain  demonstrated 3 intracranial metastases including 2 small subcentimeter metastases and a larger right hemorrhagic lesion.  Patient was established with medical oncology Dr. Mohammed/radiation oncology Dr. Moody.  PET scan completed showing hypermetabolic lingular mass with central necrosis consistent with primary bronchogenic carcinoma.  No evidence of mediastinal nodal metastasis.  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  Patient was admitted 09/03/2020 and underwent stereotactic right temporal craniotomy for resection of brain metastasis per Dr. Nundkumar.  Surgical pathology report consistent with metastatic melanoma.  Maintained on Keppra for seizure prophylaxis.  Decadron protocol as indicated.  Medical oncology as well as radiation oncology continue to follow with radiation therapy as directed.  Tolerating a regular consistency diet.  No current plan at this time for DVT prophylaxis due to some bleeding hemorrhoids.  Therapy evaluations completed due to patient decreased functional mobility patient was recommended for a comprehensive rehab program.  Complete NIHSS TOTAL: 0  Patient's medical record from [place] has been reviewed by the rehabilitation admission coordinator and physician.  Past Medical History  Past Medical History:  Diagnosis Date  . Cancer (HCC)    melonoma on stomach, lung,   brain  . Gait disorder 02/04/2017   cane   . High cholesterol   . Hypertension   . Pneumonia   . Stroke (HCC) 04/2019 last one   gait unsteady- . uses a cane,2-3 mini strokes    Family History   family history includes Cancer in his brother; Cancer - Prostate in his father; Heart attack in his mother; Heart disease in his father.  Prior Rehab/Hospitalizations Has the patient had prior rehab or hospitalizations prior to admission? No  Has the patient had major surgery during 100 days prior to admission? Yes   Current  Medications  Current Facility-Administered Medications:  .  acetaminophen (TYLENOL) tablet 650 mg, 650 mg, Oral, Q4H PRN, 650 mg at 09/06/20 0520 **OR** acetaminophen (TYLENOL) suppository 650 mg, 650 mg, Rectal, Q4H PRN, Nundkumar, Neelesh, MD .  atorvastatin (LIPITOR) tablet 40 mg, 40 mg, Oral, Daily, Nundkumar, Neelesh, MD, 40 mg at 09/10/20 0802 .  bisacodyl (DULCOLAX) suppository 10 mg, 10 mg, Rectal, Daily PRN, Nundkumar, Neelesh, MD .  [COMPLETED] dexamethasone (DECADRON) tablet 6 mg, 6 mg, Oral, Q6H, 6 mg at 09/04/20 1202 **FOLLOWED BY** [COMPLETED] dexamethasone (DECADRON) tablet 4 mg, 4 mg, Oral, Q6H, 4 mg at 09/05/20 1217 **FOLLOWED BY** dexamethasone (DECADRON) tablet 2 mg, 2 mg, Oral, Q8H, Cram, Gary, MD, 2 mg at 09/10/20 0508 .  docusate sodium (COLACE) capsule 100 mg, 100 mg, Oral, BID, Nundkumar, Neelesh, MD, 100 mg at 09/10/20 0802 .  hydrochlorothiazide (MICROZIDE) capsule 12.5 mg, 12.5 mg, Oral, Daily, Nundkumar, Neelesh, MD, 12.5 mg at 09/10/20 0803 .  HYDROcodone-acetaminophen (NORCO/VICODIN) 5-325 MG per tablet 1 tablet, 1 tablet, Oral, Q4H PRN, Nundkumar, Neelesh, MD, 1 tablet at 09/04/20 0541 .  labetalol (NORMODYNE) injection 10-40 mg, 10-40 mg, Intravenous, Q10 min PRN, Nundkumar, Neelesh, MD .  levETIRAcetam (KEPPRA) tablet 500 mg, 500 mg, Oral, BID, Nundkumar, Neelesh, MD, 500 mg at 09/10/20 0802 .  lisinopril (ZESTRIL) tablet 10 mg, 10 mg, Oral, Daily, Nundkumar, Neelesh, MD, 10 mg at 09/10/20 0802 .  LORazepam (ATIVAN) tablet 0.25 mg, 0.25 mg, Oral, Daily PRN, Nundkumar, Neelesh, MD .  magnesium citrate solution 1 Bottle, 1 Bottle, Oral, Once PRN, Nundkumar, Neelesh, MD .  ondansetron (ZOFRAN) tablet 4 mg, 4 mg, Oral, Q4H PRN **OR** ondansetron (ZOFRAN) injection 4 mg, 4 mg, Intravenous, Q4H PRN, Nundkumar, Neelesh, MD .  pantoprazole (PROTONIX) EC tablet 40 mg, 40 mg, Oral, QHS, Nundkumar, Neelesh, MD, 40 mg at 09/09/20 2100 .  promethazine (PHENERGAN) tablet 12.5-25  mg, 12.5-25 mg, Oral, Q4H PRN, Nundkumar, Neelesh, MD .  senna (SENOKOT) tablet 8.6 mg, 1 tablet, Oral, BID, Nundkumar, Neelesh, MD, 8.6 mg at 09/10/20 0803  Patients Current Diet:  Diet Order            Diet Carb Modified Fluid consistency: Thin; Room service appropriate? Yes  Diet effective now                 Precautions / Restrictions Precautions Precautions: Fall Precaution Comments: crani Restrictions Weight Bearing Restrictions: No   Has the patient had 2 or more falls or a fall with injury in the past year? No  Prior Activity Level Community (5-7x/wk): mod I with SPC prior to admission, wife drives mostly, but he does drive some.  Active in the community  Prior Functional Level Self Care: Did the patient need help bathing, dressing, using the toilet or eating? Independent  Indoor Mobility: Did the patient need assistance with walking from room to room (with or without device)? Independent    Stairs: Did the patient need assistance with internal or external stairs (with or without device)? Independent  Functional Cognition: Did the patient need help planning regular tasks such as shopping or remembering to take medications? Independent  Home Assistive Devices / Equipment Home Assistive Devices/Equipment: Blood pressure cuff,Cane (specify quad or straight),Eyeglasses,Scales Home Equipment: Cane - single point  Prior Device Use: Indicate devices/aids used by the patient prior to current illness, exacerbation or injury? single point cane  Current Functional Level Cognition  Overall Cognitive Status: Impaired/Different from baseline Current Attention Level: Selective Orientation Level: Oriented X4 Following Commands: Follows one step commands consistently,Follows multi-step commands with increased time Safety/Judgement: Decreased awareness of safety,Decreased awareness of deficits General Comments: increased cues for safety and body mechanics during balance challenges,  easily distracted and tangential at times    Extremity Assessment (includes Sensation/Coordination)  Upper Extremity Assessment: LUE deficits/detail LUE Coordination:  (mild incoordination noted)  Lower Extremity Assessment: Defer to PT evaluation    ADLs  Overall ADL's : Needs assistance/impaired Eating/Feeding: Set up,Supervision/ safety Eating/Feeding Details (indicate cue type and reason): impulsive during eating with increased spillage. Poured entire bowl of shredded cheese onto pizza, then flipped pizza upside down spilling majority of cheese onto his lap wtih poor awareness Grooming: Wash/dry hands,Standing,Supervision/safety Upper Body Bathing: Set up,Supervision/ safety,Sitting Lower Body Bathing: Minimal assistance,Sit to/from stand Upper Body Dressing : Minimal assistance,Sitting Lower Body Dressing: Moderate assistance,Sit to/from stand Toilet Transfer: Min guard,RW,Ambulation,Regular Toilet Toileting- Clothing Manipulation and Hygiene: Sit to/from stand,Minimal assistance Functional mobility during ADLs: Min guard,Rolling walker General ADL Comments: pt completing toileting, grooming tasks, dynamic balance tasks and visual scanning tasks    Mobility  Overal bed mobility: Needs Assistance Bed Mobility: Supine to Sit Supine to sit: Min guard,HOB elevated General bed mobility comments: pt received off stretcher coming back from transport to radiation, pt left up in recliner    Transfers  Overall transfer level: Needs assistance Equipment used: Rolling walker (2 wheeled) Transfers: Sit to/from Stand Sit to Stand: Min guard,Min assist General transfer comment: minguard assist to rise for safety, MIN A needed when descending onto sitting surface as pt with decreased recall of reaching back to sitting surface    Ambulation / Gait / Stairs / Wheelchair Mobility  Ambulation/Gait Ambulation/Gait assistance: Min assist,Mod assist Gait Distance (Feet): 75 Feet Assistive  device: 1 person hand held assist Gait Pattern/deviations: Step-through pattern,Decreased stride length,Decreased stance time - left,Decreased dorsiflexion - left,Decreased weight shift to left,Staggering left,Staggering right General Gait Details: pt with wide BOS and heavy reliance on HHA and RLE. Decreased wt acceptance to L, multiple posterior and L lateral LOB requiring modA to recover. Gait velocity: decreased Gait velocity interpretation: 1.31 - 2.62 ft/sec, indicative of limited community ambulator    Posture / Balance Balance Overall balance assessment: Needs assistance Sitting-balance support: Feet supported Sitting balance-Leahy Scale: Good Standing balance support: Bilateral upper extremity supported Standing balance-Leahy Scale: Poor Standing balance comment: reliant on BUE support during standing toe taps activity, pt with poor proprioception unable to complete toe taps wtih eyes occluded High level balance activites: Backward walking,Direction changes,Sudden stops,Other (comment) (weaving, stepping oer objects) High Level Balance Comments: pt with significant increase in assist needed with any dynamic balance challenge. MinA to modA to steady with quick turns, stops, or changes in direction. The pt was also challenged by stepping over or around objects on the ground, and often needed cues to make adjustemnts and to maintain upright while pt made adjustements    Special needs/care consideration Skin   cranial incision and Special service needs radiation (6/7, 6/8, 6/9)    Previous Home Environment (from acute therapy documentation) Living Arrangements: Spouse/significant other Available Help at Discharge: Family,Available 24 hours/day Type of Home: House Home Layout: One level Home Access: Stairs to enter Entrance Stairs-Number of Steps: 2 Bathroom Shower/Tub: Walk-in shower Bathroom Toilet: Standard Bathroom Accessibility: Yes How Accessible: Accessible via walker Home Care  Services: No  Discharge Living Setting Plans for Discharge Living Setting: Patient's home,Lives with (comment) (spouse) Type of Home at Discharge: Other (Comment) (townhome) Discharge Home Layout: One level Discharge Home Access: Stairs to enter Entrance Stairs-Rails: Right,Left Entrance Stairs-Number of Steps: curb step +2 Discharge Bathroom Shower/Tub: Walk-in shower Discharge Bathroom Toilet: Standard Discharge Bathroom Accessibility: Yes How Accessible: Accessible via walker Does the patient have any problems obtaining your medications?: No  Social/Family/Support Systems Patient Roles: Spouse Anticipated Caregiver: spouse, Gabriel Holle Anticipated Caregiver's Contact Information: [phone] Ability/Limitations of Caregiver: supervision Caregiver Availability: 24/7 Discharge Plan Discussed with Primary Caregiver: Yes Is Caregiver In Agreement with Plan?: Yes Does Caregiver/Family have Issues with Lodging/Transportation while Pt is in Rehab?: No  Goals Patient/Family Goal for Rehab: PT/OT/SLP supervision to mod I Expected length of stay: 12-16 days Additional Information: radiation 6/7, 6/8, and 6/9 Pt/Family Agrees to Admission and willing to participate: Yes Program Orientation Provided & Reviewed with Pt/Caregiver Including Roles  & Responsibilities: Yes Additional Information Needs: Susan Boils (radiation coordinator) [phone].  She does not actually have the patient but has been my contact.  Decrease burden of Care through IP rehab admission: n/a  Possible need for SNF placement upon discharge: No  Patient Condition: I have reviewed medical records from [place], spoken with CM, and patient and spouse. I met with patient at the bedside for inpatient rehabilitation assessment.  Patient will benefit from ongoing PT, OT and SLP, can actively participate in 3 hours of therapy a day 5 days of the week, and can make measurable gains during the admission.  Patient will  also benefit from the coordinated team approach during an Inpatient Acute Rehabilitation admission.  The patient will receive intensive therapy as well as Rehabilitation physician, nursing, social worker, and care management interventions.  Due to safety, skin/wound care, disease management, medication administration, pain management and patient education the patient requires 24 hour a day rehabilitation nursing.  The patient is currently min to mod assist +1 with mobility and basic ADLs.  Discharge setting and therapy post discharge at home with home health is anticipated.  Patient has agreed to participate in the Acute Inpatient Rehabilitation Program and will admit today.  Preadmission Screen Completed By:  Kaveon Blatz E Samyra Limb, PT, DPT 09/10/2020 11:14 AM ______________________________________________________________________   Discussed status with Dr. Lovorn on 09/10/20  at 11:20 AM  and received approval for admission today.  Admission Coordinator:  Enrique Weiss E Ahria Slappey, PT, DPT, time 11:20 AM /Date 09/10/20    Assessment/Plan: Diagnosis: 1. Does the need for close, 24 hr/day Medical supervision in concert with the patient's rehab needs make it unreasonable for this patient to be served in a less intensive setting? Yes 2. Co-Morbidities requiring supervision/potential complications: Stage IV melanoma, S/P R temporal crani; L inattention, HTN, lung mets; hx of multiple TIAs 3. Due to bladder management, bowel management, safety, skin/wound care, disease management, medication administration, pain management and patient education, does the patient require 24 hr/day rehab nursing? Yes 4. Does the patient require coordinated care of a physician, rehab nurse, PT, OT, and SLP to address physical and functional deficits in   the context of the above medical diagnosis(es)? Yes Addressing deficits in the following areas: balance, endurance, locomotion, strength, transferring, bowel/bladder control, bathing, dressing,  feeding, grooming, toileting, cognition and speech 5. Can the patient actively participate in an intensive therapy program of at least 3 hrs of therapy 5 days a week? Yes 6. The potential for patient to make measurable gains while on inpatient rehab is good 7. Anticipated functional outcomes upon discharge from inpatient rehab: modified independent and supervision PT, modified independent and supervision OT, modified independent and supervision SLP 8. Estimated rehab length of stay to reach the above functional goals is: 12-16 days 9. Anticipated discharge destination: Home 10. Overall Rehab/Functional Prognosis: good   MD Signature:  

## 2020-09-10 NOTE — Plan of Care (Signed)

## 2020-09-10 NOTE — TOC Transition Note (Signed)
Transition of Care [facility]) - CM/SW Discharge Note   Patient Details  Name: Gabriel [name] MRN: [id number] Date of Birth: 11/13/1930  [facility] CM/SW Contact:  Pollie Friar, RN Phone Number: 09/10/2020, 11:41 AM   Clinical Narrative:    Patient is discharging to CIR today. CM signing off.   Final next level of care: IP Rehab Facility Barriers to Discharge: No Barriers Identified   Patient Goals and CMS Choice        Discharge Placement                       Discharge Plan and Services                                     Social Determinants of Health (SDOH) Interventions     Readmission Risk Interventions No flowsheet data found.

## 2020-09-11 ENCOUNTER — Other Ambulatory Visit: Payer: Self-pay | Admitting: Radiation Therapy

## 2020-09-11 ENCOUNTER — Ambulatory Visit
Admission: RE | Admit: 2020-09-11 | Discharge: 2020-09-11 | Disposition: A | Discharge status: Home / Self Care | Payer: Medicare Other | Source: Ambulatory Visit | Attending: Radiation Oncology | Admitting: Radiation Oncology

## 2020-09-11 ENCOUNTER — Ambulatory Visit: Payer: Medicare Other

## 2020-09-11 DIAGNOSIS — C7931 Secondary malignant neoplasm of brain: Secondary | ICD-10-CM

## 2020-09-11 DIAGNOSIS — C799 Secondary malignant neoplasm of unspecified site: Secondary | ICD-10-CM

## 2020-09-11 LAB — CBC WITH DIFFERENTIAL/PLATELET
Abs Immature Granulocytes: 0.29 10*3/uL — ABNORMAL HIGH (ref 0.00–0.07)
Basophils Absolute: 0 10*3/uL (ref 0.0–0.1)
Basophils Relative: 0 %
Eosinophils Absolute: 0 10*3/uL (ref 0.0–0.5)
Eosinophils Relative: 1 %
HCT: 31.8 % — ABNORMAL LOW (ref 39.0–52.0)
Hemoglobin: 10 g/dL — ABNORMAL LOW (ref 13.0–17.0)
Immature Granulocytes: 5 %
Lymphocytes Relative: 8 %
Lymphs Abs: 0.5 10*3/uL — ABNORMAL LOW (ref 0.7–4.0)
MCH: 27.6 pg (ref 26.0–34.0)
MCHC: 31.4 g/dL (ref 30.0–36.0)
MCV: 87.8 fL (ref 80.0–100.0)
Monocytes Absolute: 0.4 10*3/uL (ref 0.1–1.0)
Monocytes Relative: 6 %
Neutro Abs: 4.8 10*3/uL (ref 1.7–7.7)
Neutrophils Relative %: 80 %
Platelets: 170 10*3/uL (ref 150–400)
RBC: 3.62 MIL/uL — ABNORMAL LOW (ref 4.22–5.81)
RDW: 17.5 % — ABNORMAL HIGH (ref 11.5–15.5)
WBC: 6 10*3/uL (ref 4.0–10.5)
nRBC: 0 % (ref 0.0–0.2)

## 2020-09-11 LAB — COMPREHENSIVE METABOLIC PANEL
ALT: 27 U/L (ref 0–44)
AST: 15 U/L (ref 15–41)
Albumin: 2.6 g/dL — ABNORMAL LOW (ref 3.5–5.0)
Alkaline Phosphatase: 67 U/L (ref 38–126)
Anion gap: 9 (ref 5–15)
BUN: 30 mg/dL — ABNORMAL HIGH (ref 8–23)
CO2: 26 mmol/L (ref 22–32)
Calcium: 8 mg/dL — ABNORMAL LOW (ref 8.9–10.3)
Chloride: 100 mmol/L (ref 98–111)
Creatinine, Ser: 0.93 mg/dL (ref 0.61–1.24)
GFR, Estimated: >60 mL/min (ref >60)
Glucose, Bld: 117 mg/dL — ABNORMAL HIGH (ref 70–99)
Potassium: 4.3 mmol/L (ref 3.5–5.1)
Sodium: 135 mmol/L (ref 135–145)
Total Bilirubin: 0.4 mg/dL (ref 0.3–1.2)
Total Protein: 5.5 g/dL — ABNORMAL LOW (ref 6.5–8.1)

## 2020-09-11 MED ORDER — SORBITOL 70 % SOLN
30.0000 mL | Freq: Once | Status: DC
  Filled 2020-09-11 (×2): qty 30

## 2020-09-11 NOTE — Evaluation (Signed)
Speech Language Pathology Assessment and Plan  Patient Details  Name: Gabriel [name] MRN: [id number] Date of Birth: [birth year]  SLP Diagnosis: Cognitive Impairments  Rehab Potential: Fair ELOS: 12-16 days    Today's Date: 09/11/2020 SLP Individual Time: 1122-1222 SLP Individual Time Calculation (min): 60 min   Hospital Problem: Principal Problem:   Metastatic melanoma ([place])  Past Medical History:  Past Medical History:  Diagnosis Date  . Cancer ([place])    melonoma on stomach, lung, brain  . Gait disorder 02/04/2017   cane   . High cholesterol   . Hypertension   . Pneumonia   . Stroke ([place]) 04/2019 last one   gait unsteady- . uses a cane,2-3 mini strokes   Past Surgical History:  Past Surgical History:  Procedure Laterality Date  . APPLICATION OF CRANIAL NAVIGATION N/A 09/03/2020   Procedure: APPLICATION OF CRANIAL NAVIGATION;  Surgeon: [name], MD;  Location: [place];  Service: Neurosurgery;  Laterality: N/A;  RM 20  . BRONCHIAL BIOPSY  08/20/2020   Procedure: BRONCHIAL BIOPSIES;  Surgeon: Garner Nash, DO;  Location: [facility];  Service: Pulmonary;;  . BRONCHIAL BRUSHINGS  08/20/2020   Procedure: BRONCHIAL BRUSHINGS;  Surgeon: Garner Nash, DO;  Location: [place];  Service: Pulmonary;;  . BRONCHIAL NEEDLE ASPIRATION BIOPSY  08/20/2020   Procedure: BRONCHIAL NEEDLE ASPIRATION BIOPSIES;  Surgeon: Garner Nash, DO;  Location: [place];  Service: Pulmonary;;  . BRONCHIAL WASHINGS  08/20/2020   Procedure: BRONCHIAL WASHINGS;  Surgeon: Garner Nash, DO;  Location: [place];  Service: Pulmonary;;  . CRANIOTOMY Right 09/03/2020   Procedure: Stereotactic right temporal craniotomy for resection of tumor;  Surgeon: [name], MD;  Location: [place];  Service: Neurosurgery;  Laterality: Right;  . HEMOSTASIS CONTROL  08/20/2020   Procedure: HEMOSTASIS CONTROL;  Surgeon: Garner Nash, DO;  Location: [facility];  Service: Pulmonary;;  .  VIDEO BRONCHOSCOPY WITH ENDOBRONCHIAL NAVIGATION Left 08/20/2020   Procedure: VIDEO BRONCHOSCOPY WITH ENDOBRONCHIAL NAVIGATION;  Surgeon: Garner Nash, DO;  Location: [place];  Service: Pulmonary;  Laterality: Left;  Marland Kitchen VIDEO BRONCHOSCOPY WITH ENDOBRONCHIAL ULTRASOUND Left 08/20/2020   Procedure: VIDEO BRONCHOSCOPY WITH ENDOBRONCHIAL ULTRASOUND;  Surgeon: Garner Nash, DO;  Location: [place];  Service: Pulmonary;  Laterality: Left;    Assessment / Plan / Recommendation Clinical Impression   Gabriel [name] a 87 year old right-handed male with history of hyperlipidemia, hypertension, CVA with unsteady gait maintained on Plavix. Per chart review patient lives with spouse. Independent with assistive device. 1 level home 2 steps to entry. 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 CT scan revealed left upper lobe mass 7.8 x 6.8 cm in size. A small left hilar node was also identified. Additionally there was renal masses concerning for primary renal disease versus metastatic disease. MRI of the brain demonstrated 3 intracranial metastasesincluding 2 small subcentimeter metastasesand a larger right hemorrhagic lesion. Patient was established with medical oncology Dr. Mohammed/radiation oncology Dr. Lisbeth Renshaw. PET scan completed showing hypermetabolic lingular mass with central necrosis consistent with primary bronchogenic carcinoma. No evidence of mediastinal nodal metastasis. 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 Patient was admitted 09/03/2020 and underwent stereotactic right temporal craniotomy for resection of brain metastasis per Dr. Kathyrn Sheriff. Surgical pathology report consistent with metastatic melanoma. Maintained on Keppra for seizure prophylaxis. Decadron protocol as indicated. Medical oncology as well as radiation oncology continue to follow with  radiation therapy  as directed. Tolerating a regular consistency diet.No current plan at this time for DVT prophylaxis due to some bleeding hemorrhoids.Therapy evaluations completed due to patient decreased functional mobility patient was admitted for a comprehensive rehab program.  Pt presents with mild-moderate cognitive linguistic impairments, deficits include reduced awareness of deficits, basic problem solving, short term recall and sustained attention. Noted impairments further supported by formal assessment, SLUMS pt scored 23 out 30 (n=>27) indicating mild deficits. Pt is able to list physical deficits but denies all cognitive changes, supporting baseline skills. Pt's wife supports changes in cognitive skills most notably awareness and problem solving, prior to surgery that have further exasperated following surgery. Pt would benefit from skilled ST services in order to maximize functional independence and reduce burden of care requiring 24 hour supervision and continued ST services.    Skilled Therapeutic Interventions          Skilled ST services focused on cognitive skills. SLP administered formal cognitive linguistic assessment and provided education pertaining to acute deficits. SLP, pt and pt's wife set goals for treatment. All questions answered to satisfaction. Pt was left with wife in room and bed alarm set. Recommend to continue ST services.    SLP Assessment  Patient will need skilled [facility] Services during CIR admission    Recommendations  Recommendations for Other Services: Neuropsych consult Patient destination: Home Follow up Recommendations: Home Health SLP;24 hour supervision/assistance;Outpatient SLP Equipment Recommended: None recommended by SLP    SLP Frequency 3 to 5 out of 7 days   SLP Duration  SLP Intensity  SLP Treatment/Interventions 12-16 days  Minumum of 1-2 x/day, 30 to 90 minutes  Cognitive remediation/compensation;Cueing  hierarchy;Functional tasks;Internal/external aids;Patient/family education    Pain Pain Assessment Pain Scale: 0-10 Pain Score: 0-No pain  Prior Functioning Cognitive/Linguistic Baseline: Baseline deficits Baseline deficit details: likely due to brain tumor in recall, problem solving and error awareness Type of Home: House  Lives With: Spouse Available Help at Discharge: Family;Available 24 hours/day  SLP Evaluation Cognition Overall Cognitive Status: Impaired/Different from baseline Orientation Level: Oriented X4 Attention: Sustained Sustained Attention: Impaired Sustained Attention Impairment: Verbal basic;Functional basic Memory: Impaired Memory Impairment: Storage deficit;Retrieval deficit;Decreased recall of new information Awareness: Impaired Awareness Impairment: Intellectual impairment;Emergent impairment Problem Solving: Impaired Problem Solving Impairment: Functional basic;Verbal basic Safety/Judgment: Impaired  Comprehension Auditory Comprehension Yes/No Questions: Within Functional Limits Commands: Impaired One Step Basic Commands: 75-100% accurate Two Step Basic Commands: 50-74% accurate (impacted by recall deficits) Conversation: Complex Interfering Components: Attention Visual Recognition/Discrimination Discrimination: Not tested Reading Comprehension Reading Status: Not tested Expression Expression Primary Mode of Expression: Verbal Verbal Expression Overall Verbal Expression: Appears within functional limits for tasks assessed Written Expression Dominant Hand: Right Oral Motor Oral Motor/Sensory Function Overall Oral Motor/Sensory Function: Within functional limits Motor Speech Overall Motor Speech: Appears within functional limits for tasks assessed  Care Tool Care Tool Cognition Expression of Ideas and Wants     Understanding Verbal and Non-Verbal Content     Memory/Recall Ability *first 3 days only       Short Term Goals: Week 1: SLP  Short Term Goal 1 (Week 1): Pt will demonstrate sustained attention in 5-7 minute intervals with supervision A verbal cues, SLP Short Term Goal 2 (Week 1): Pt will demonstrate basic problem solving skills in familiar tasks with min A verbal cues. SLP Short Term Goal 3 (Week 1): Pt will demonstrate self-monitoring in basic problem solving tasks with max A verbal cues. SLP Short Term Goal 4 (Week 1): Pt  will demonstrate recall of novel, functional information with mod A verbal cues for compensatory aids.  Refer to Care Plan for Long Term Goals  Recommendations for other services: Neuropsych  Discharge Criteria: Patient will be discharged from SLP if patient refuses treatment 3 consecutive times without medical reason, if treatment goals not met, if there is a change in medical status, if patient makes no progress towards goals or if patient is discharged from hospital.  The above assessment, treatment plan, treatment alternatives and goals were discussed and mutually agreed upon: by patient and by family  Evett Kassa  [facility] 09/11/2020, 12:43 PM

## 2020-09-11 NOTE — Progress Notes (Signed)
Inpatient Rehabilitation  Patient information reviewed and entered into eRehab system by Mattheus Rauls M. Johngabriel Verde, M.A., CCC/SLP, PPS Coordinator.  Information including medical coding, functional ability and quality indicators will be reviewed and updated through discharge.    

## 2020-09-11 NOTE — Plan of Care (Signed)
  Problem: RH Balance Goal: LTG Patient will maintain dynamic sitting balance (PT) Description: LTG:  Patient will maintain dynamic sitting balance with assistance during mobility activities (PT) Flowsheets (Taken 09/11/2020 1838) LTG: Pt will maintain dynamic sitting balance during mobility activities with:: Independent with assistive device  Goal: LTG Patient will maintain dynamic standing balance (PT) Description: LTG:  Patient will maintain dynamic standing balance with assistance during mobility activities (PT) Flowsheets (Taken 09/11/2020 1838) LTG: Pt will maintain dynamic standing balance during mobility activities with:: Supervision/Verbal cueing   Problem: RH Bed Mobility Goal: LTG Patient will perform bed mobility with assist (PT) Description: LTG: Patient will perform bed mobility with assistance, with/without cues (PT). Flowsheets (Taken 09/11/2020 1838) LTG: Pt will perform bed mobility with assistance level of: Independent with assistive device    Problem: RH Bed to Chair Transfers Goal: LTG Patient will perform bed/chair transfers w/assist (PT) Description: LTG: Patient will perform bed to chair transfers with assistance (PT). Flowsheets (Taken 09/11/2020 1838) LTG: Pt will perform Bed to Chair Transfers with assistance level: Supervision/Verbal cueing   Problem: RH Car Transfers Goal: LTG Patient will perform car transfers with assist (PT) Description: LTG: Patient will perform car transfers with assistance (PT). Flowsheets (Taken 09/11/2020 1838) LTG: Pt will perform car transfers with assist:: Supervision/Verbal cueing   Problem: RH Furniture Transfers Goal: LTG Patient will perform furniture transfers w/assist (OT/PT) Description: LTG: Patient will perform furniture transfers  with assistance (OT/PT). Flowsheets (Taken 09/11/2020 1838) LTG: Pt will perform furniture transfers with assist:: Supervision/Verbal cueing   Problem: RH Ambulation Goal: LTG Patient will ambulate in  controlled environment (PT) Description: LTG: Patient will ambulate in a controlled environment, # of feet with assistance (PT). Flowsheets (Taken 09/11/2020 1838) LTG: Pt will ambulate in controlled environ  assist needed:: Supervision/Verbal cueing LTG: Ambulation distance in controlled environment: 14ft with LRAD Goal: LTG Patient will ambulate in home environment (PT) Description: LTG: Patient will ambulate in home environment, # of feet with assistance (PT). Flowsheets (Taken 09/11/2020 1838) LTG: Pt will ambulate in home environ  assist needed:: Supervision/Verbal cueing LTG: Ambulation distance in home environment: 69ft with LRAD   Problem: RH Wheelchair Mobility Goal: LTG Patient will propel w/c in controlled environment (PT) Description: LTG: Patient will propel wheelchair in controlled environment, # of feet with assist (PT) Flowsheets (Taken 09/11/2020 1838) LTG: Pt will propel w/c in controlled environ  assist needed:: Supervision/Verbal cueing LTG: Propel w/c distance in controlled environment: 135ft   Problem: RH Stairs Goal: LTG Patient will ambulate up and down stairs w/assist (PT) Description: LTG: Patient will ambulate up and down # of stairs with assistance (PT) Flowsheets (Taken 09/11/2020 1838) LTG: Pt will ambulate up/down stairs assist needed:: Supervision/Verbal cueing LTG: Pt will  ambulate up and down number of stairs: 2 steps to enter house

## 2020-09-11 NOTE — Evaluation (Signed)
Physical Therapy Assessment and Plan  Patient Details  Name: Gabriel [name] MRN: [id number] Date of Birth: 12/10/1930  PT Diagnosis: Abnormal posture, Abnormality of gait, Coordination disorder, Hemiplegia non-dominant and Muscle weakness Rehab Potential: Good ELOS: 10-12 days   Today's Date: 09/11/2020 PT Individual Time: 1304-1335 PT Individual Time Calculation (min): 31 min    Hospital Problem: Principal Problem:   Metastatic melanoma (HCC)   Past Medical History:  Past Medical History:  Diagnosis Date  . Cancer (HCC)    melonoma on stomach, lung, brain  . Gait disorder 02/04/2017   cane   . High cholesterol   . Hypertension   . Pneumonia   . Stroke (HCC) 04/2019 last one   gait unsteady- . uses a cane,2-3 mini strokes   Past Surgical History:  Past Surgical History:  Procedure Laterality Date  . APPLICATION OF CRANIAL NAVIGATION N/A 09/03/2020   Procedure: APPLICATION OF CRANIAL NAVIGATION;  Surgeon: Nundkumar, Neelesh, MD;  Location: [facility];  Service: Neurosurgery;  Laterality: N/A;  RM 20  . BRONCHIAL BIOPSY  08/20/2020   Procedure: BRONCHIAL BIOPSIES;  Surgeon: Icard, Bradley L, DO;  Location: [facility];  Service: Pulmonary;;  . BRONCHIAL BRUSHINGS  08/20/2020   Procedure: BRONCHIAL BRUSHINGS;  Surgeon: Icard, Bradley L, DO;  Location: [facility];  Service: Pulmonary;;  . BRONCHIAL NEEDLE ASPIRATION BIOPSY  08/20/2020   Procedure: BRONCHIAL NEEDLE ASPIRATION BIOPSIES;  Surgeon: Icard, Bradley L, DO;  Location: [facility];  Service: Pulmonary;;  . BRONCHIAL WASHINGS  08/20/2020   Procedure: BRONCHIAL WASHINGS;  Surgeon: Icard, Bradley L, DO;  Location: [facility];  Service: Pulmonary;;  . CRANIOTOMY Right 09/03/2020   Procedure: Stereotactic right temporal craniotomy for resection of tumor;  Surgeon: Nundkumar, Neelesh, MD;  Location: [facility];  Service: Neurosurgery;  Laterality: Right;  . HEMOSTASIS CONTROL  08/20/2020   Procedure: HEMOSTASIS CONTROL;  Surgeon:  Icard, Bradley L, DO;  Location: [facility];  Service: Pulmonary;;  . VIDEO BRONCHOSCOPY WITH ENDOBRONCHIAL NAVIGATION Left 08/20/2020   Procedure: VIDEO BRONCHOSCOPY WITH ENDOBRONCHIAL NAVIGATION;  Surgeon: Icard, Bradley L, DO;  [facility];  Service: Pulmonary;  Laterality: Left;  . VIDEO BRONCHOSCOPY WITH ENDOBRONCHIAL ULTRASOUND Left 08/20/2020   Procedure: VIDEO BRONCHOSCOPY WITH ENDOBRONCHIAL ULTRASOUND;  Surgeon: Icard, Bradley L, DO;  Location: [facility];  Service: Pulmonary;  Laterality: Left;    Assessment & Plan Clinical Impression: Patient is a 87 y.o. right-handed male with history of hyperlipidemia, hypertension, CVA with unsteady gait maintained on Plavix. Per chart review patient lives with spouse. Independent with assistive device. 1 level home 2 steps to entry. 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 CT scan revealed left upper lobe mass 7.8 x 6.8 cm in size. A small left hilar node was also identified. Additionally there was renal masses concerning for primary renal disease versus metastatic disease. MRI of the brain demonstrated 3 intracranial metastasesincluding 2 small subcentimeter metastasesand a larger right hemorrhagic lesion. Patient was established with medical oncology Dr. Mohammed/radiation oncology Dr. Moody. PET scan completed showing hypermetabolic lingular mass with central necrosis consistent with primary bronchogenic carcinoma. No evidence of mediastinal nodal metastasis. 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 Patient was admitted 09/03/2020 and underwent stereotactic right temporal craniotomy for resection of brain metastasis per Dr. Nundkumar. Surgical pathology report consistent with metastatic melanoma. Maintained on Keppra for seizure prophylaxis. Decadron protocol as indicated. Medical oncology as well as  radiation oncology   continue to follow with radiation therapy as directed. Tolerating a regular consistency diet.No current plan at this time for DVT prophylaxis due to some bleeding hemorrhoids.Therapy evaluations completed due to patient decreased functional mobility patient was admitted for a comprehensive rehab program.   Patient transferred to CIR on 09/10/2020 .  Patient currently requires min with mobility secondary to muscle weakness and muscle paralysis, decreased cardiorespiratoy endurance, unbalanced muscle activation, decreased coordination and decreased motor planning and decreased sitting balance, decreased standing balance, decreased postural control, hemiplegia and decreased balance strategies.  Prior to hospitalization, patient was modified independent  with mobility and lived with Spouse in a House (one level condo) home.  Home access is 2 + 1 + 1Stairs to enter.  Patient will benefit from skilled PT intervention to maximize safe functional mobility, minimize fall risk and decrease caregiver burden for planned discharge home with intermittent assist.  Anticipate patient will benefit from follow up [facility] at discharge.  PT - End of Session Activity Tolerance: Tolerates 10 - 20 min activity with multiple rests Endurance Deficit: Yes PT Assessment Rehab Potential (ACUTE/IP ONLY): Good PT Barriers to Discharge: [place] home environment;Home environment access/layout;Insurance for SNF coverage;Pending chemo/radiation PT Patient demonstrates impairments in the following area(s): Balance;Behavior;Edema;Endurance;Motor;Perception;Safety;Skin Integrity PT Transfers Functional Problem(s): Bed Mobility;Bed to Chair;Car;Furniture;Floor PT Locomotion Functional Problem(s): Ambulation;Wheelchair Mobility;Stairs PT Plan PT Intensity: Minimum of 1-2 x/day ,45 to 90 minutes PT Frequency: 5 out of 7 days PT Duration Estimated Length of Stay: 10-12 days PT Treatment/Interventions: Ambulation/gait  training;Community reintegration;DME/adaptive equipment instruction;Neuromuscular re-education;Psychosocial support;Stair training;UE/LE Strength taining/ROM;Wheelchair propulsion/positioning;Balance/vestibular training;Discharge planning;Functional electrical stimulation;Pain management;Skin care/wound management;Therapeutic Activities;UE/LE Coordination activities;Cognitive remediation/compensation;Disease management/prevention;Functional mobility training;Splinting/orthotics;Patient/family education;Therapeutic Exercise;Visual/perceptual remediation/compensation PT Transfers Anticipated Outcome(s): Supervision assist with LRAD PT Locomotion Anticipated Outcome(s): ambulatory with LRAD at supervision assist level PT Recommendation Recommendations for Other Services: Neuropsych consult;Therapeutic Recreation consult Therapeutic Recreation Interventions: Clinical cytogeneticist;Outing/community reintergration Follow Up Recommendations: Home health PT Patient destination: Home Equipment Recommended: To be determined;Rolling walker with 5" wheels   PT Evaluation Precautions/Restrictions Precautions Precautions: Fall Precaution Comments: crani Restrictions Weight Bearing Restrictions: No General   Vital Signs  Pain Pain Assessment Pain Scale: 0-10 Pain Score: 0-No pain Home Living/Prior Functioning Home Living Living Arrangements: Spouse/significant other Available Help at Discharge: Family;Available 24 hours/day Type of Home: House (one level condo) Home Access: Stairs to enter [employer] of Steps: 2 + 1 + 1 Entrance Stairs-Rails: Can reach both Home Layout: One level Bathroom Shower/Tub: Walk-in shower (Small ledge to step over) Biochemist, clinical: Standard Bathroom Accessibility: Yes  Lives With: Spouse Prior Function Level of Independence: Requires assistive device for independence  Able to Take Stairs?: Yes Driving: Yes Vocation: Part time  employment Vocation Requirements: sells life insurance, recently wrote and published a "memoir" Comments: Wife recently started driving Vision/Perception  Vision - Assessment Additional Comments: would benefit from additional assessment during functional and structed tasks Praxis Praxis: Impaired Praxis Impairment Details: Motor planning  Cognition Overall Cognitive Status: Impaired/Different from baseline Arousal/Alertness: Awake/alert Orientation Level: Oriented X4 Attention: Sustained Sustained Attention: Impaired Sustained Attention Impairment: Verbal basic;Functional basic Memory: Impaired Memory Impairment: Decreased recall of new information Immediate Memory Recall: Sock;Blue;Bed Memory Recall Sock: Not able to recall Memory Recall Blue: With Cue Memory Recall Bed: Without Cue Awareness: Impaired Awareness Impairment: Intellectual impairment;Emergent impairment Problem Solving: Impaired Problem Solving Impairment: Functional basic;Verbal basic Safety/Judgment: Impaired Sensation Sensation Light Touch: Appears Intact Proprioception: Appears Intact Coordination Gross Motor Movements are Fluid and Coordinated: No Fine Motor Movements are Fluid and Coordinated: No Coordination  and Movement Description: mild intention temmor in BUE. mild dysmetria in BUE LUE>RUE Finger Nose Finger Test: intention tremmor BUE. decreased speed with LUE compared to R Heel Shin Test: mild dysmetria in the LLE Motor  Motor Motor: Ataxia;Hemiplegia Motor - Skilled Clinical Observations: L sided hemiplegia   Trunk/Postural Assessment  Cervical Assessment Cervical Assessment: Exceptions to [facility] (forward head and cervical flexion) Thoracic Assessment Thoracic Assessment: Exceptions to [facility] (rounded shoulders mild scoliosis) Lumbar Assessment Lumbar Assessment: Exceptions to WFL (decreaed lordosis) Postural Control Postural Control: Deficits on evaluation (L/posterior bias, in standing)   Balance Balance Balance Assessed: Yes Dynamic Sitting Balance Dynamic Sitting - Balance Support: Feet supported;During functional activity;No upper extremity supported Dynamic Sitting - Level of Assistance: 5: Stand by assistance Dynamic Sitting - Balance Activities: Forward lean/weight shifting;Reaching for objects Sitting balance - Comments: during bathing and dressing tasks Dynamic Standing Balance Dynamic Standing - Balance Support: During functional activity Dynamic Standing - Level of Assistance: 4: Min assist Dynamic Standing - Balance Activities: Forward lean/weight shifting;Lateral lean/weight shifting Dynamic Standing - Comments: during bathing and dressing tasks Extremity Assessment       RLE grossly 4+/5    LLE grossly 4/5 except ankle DF 3+/5  Care Tool Care Tool Bed Mobility Roll left and right activity   Roll left and right assist level: Minimal Assistance - Patient > 75%    Sit to lying activity   Sit to lying assist level: Minimal Assistance - Patient > 75%    Lying to sitting edge of bed activity   Lying to sitting edge of bed assist level: Minimal Assistance - Patient > 75%     Care Tool Transfers Sit to stand transfer   Sit to stand assist level: Minimal Assistance - Patient > 75%    Chair/bed transfer   Chair/bed transfer assist level: Minimal Assistance - Patient > 75%     Toilet transfer   Assist Level: Minimal Assistance - Patient > 75%    Car transfer   Car transfer assist level: Moderate Assistance - Patient 50 - 74%      Care Tool Locomotion Ambulation   Assist level: Minimal Assistance - Patient > 75% Assistive device: Hand held assist Max distance: 15  Walk 10 feet activity   Assist level: Minimal Assistance - Patient > 75% Assistive device: Hand held assist   Walk 50 feet with 2 turns activity Walk 50 feet with 2 turns activity did not occur: Safety/medical concerns      Walk 150 feet activity Walk 150 feet activity did not  occur: Safety/medical concerns      Walk 10 feet on uneven surfaces activity Walk 10 feet on uneven surfaces activity did not occur: Safety/medical concerns      Stairs Stair activity did not occur: Safety/medical concerns        Walk up/down 1 step activity Walk up/down 1 step or curb (drop down) activity did not occur: Safety/medical concerns     Walk up/down 4 steps activity did not occuR: Safety/medical concerns  Walk up/down 4 steps activity      Walk up/down 12 steps activity Walk up/down 12 steps activity did not occur: Safety/medical concerns      Pick up small objects from floor Pick up small object from the floor (from standing position) activity did not occur: Safety/medical concerns      Wheelchair       Wheelchair assist level: Minimal Assistance - Patient > 75%    Wheel 50 feet with 2  turns activity   Assist Level: Minimal Assistance - Patient > 75%  Wheel 150 feet activity   Assist Level: Minimal Assistance - Patient > 75%    Refer to Care Plan for Long Term Goals  SHORT TERM GOAL WEEK 1 PT Short Term Goal 1 (Week 1): Pt will ambulate 150ft with CGA PT Short Term Goal 2 (Week 1): Pt will perform bed mobility with supervision assist PT Short Term Goal 3 (Week 1): Pt will improve Berg balance score to >35 to indicate improved safety with functional mobility PT Short Term Goal 4 (Week 1): Pt will propell WC >150ft with supervision assist  Recommendations for other services: Neuropsych and Therapeutic Recreation  Kitchen group, Stress management and Outing/community reintegration  Skilled Therapeutic Intervention  Pt received sitting on toilet and agreeable to PT. PT instructed patient in PT Evaluation and initiated treatment intervention; see above for results. PT educated patient in POC, rehab potential, rehab goals, and discharge recommendations along with recommendation for follow-up rehabilitation services. Sit<>stand with SPC and RW with min-CGA for  safety. Gait training with SPC and With min assist with SPC and CGA with RW. Stand pivot transfer to stretcher with transport present to take pt to Radiation treatment.   Mobility Bed Mobility Bed Mobility: Supine to Sit Supine to Sit: Minimal Assistance - Patient > 75% Transfers Transfers: Sit to Stand;Stand to Sit Sit to Stand: Minimal Assistance - Patient > 75% Stand to Sit: Minimal Assistance - Patient > 75% Transfer (Assistive device): None Locomotion  Gait Ambulation: Yes Gait Assistance: Minimal Assistance - Patient > 75%   Discharge Criteria: Patient will be discharged from PT if patient refuses treatment 3 consecutive times without medical reason, if treatment goals not met, if there is a change in medical status, if patient makes no progress towards goals or if patient is discharged from hospital.  The above assessment, treatment plan, treatment alternatives and goals were discussed and mutually agreed upon: by patient and by family  Austin E Tucker 09/11/2020, 2:01 PM   

## 2020-09-11 NOTE — Progress Notes (Signed)
Inpatient [place] Individual Statement of Services  Patient Name:  Gabriel [name]  Date:  09/11/2020  Welcome to the [facility].  Our goal is to provide you with an individualized program based on your diagnosis and situation, designed to meet your specific needs.  With this comprehensive rehabilitation program, you will be expected to participate in at least 3 hours of rehabilitation therapies Monday-Friday, with modified therapy programming on the weekends.  Your rehabilitation program will include the following services:  Physical Therapy (PT), Occupational Therapy (OT), Speech Therapy (ST), 24 hour per day rehabilitation nursing, Neuropsychology, Care Coordinator, Rehabilitation Medicine, Nutrition Services and Pharmacy Services  Weekly team conferences will be held on Tuesday to discuss your progress.  Your Inpatient Rehabilitation Care Coordinator will talk with you frequently to get your input and to update you on team discussions.  Team conferences with you and your family in attendance may also be held.  Expected length of stay: 12-14 days  Overall anticipated outcome: supervision cues  Depending on your progress and recovery, your program may change. Your Inpatient Rehabilitation Care Coordinator will coordinate services and will keep you informed of any changes. Your Inpatient Rehabilitation Care Coordinator's name and contact numbers are listed  below.  The following services may also be recommended but are not provided by the [facility]:   [place] will be made to provide these services after discharge if needed.  Arrangements include referral to agencies that provide these services.  Your insurance has been verified to be:  [employer]-medicare Your primary doctor is:  [name]  Pertinent information will be shared with  your doctor and your insurance company.  Inpatient Rehabilitation Care Coordinator:  Ovidio Kin, Clifford or Emilia Beck  Information discussed with and copy given to patient by: Elease Hashimoto, 09/11/2020, 11:31 AM

## 2020-09-11 NOTE — Progress Notes (Signed)
PROGRESS NOTE   Subjective/Complaints:  Pt reports no BM "in a few days". Admits normally goes with a "Cook out shake".  But then admitted it's normal for him to go q2-3 days.  O/n needed BSC to void- went well when had nursing assistance- didn't think "could have done by himself".     ROS:  Pt denies SOB, abd pain, CP, N/V/C/D, and vision changes  Objective:   No results found. Recent Labs    09/11/20 0532  WBC 6.0  HGB 10.0*  HCT 31.8*  PLT 170   Recent Labs    09/11/20 0532  NA 135  K 4.3  CL 100  CO2 26  GLUCOSE 117*  BUN 30*  CREATININE 0.93  CALCIUM 8.0*    Intake/Output Summary (Last 24 hours) at 09/11/2020 0855 Last data filed at 09/11/2020 0700 Gross per 24 hour  Intake 120 ml  Output 600 ml  Net -480 ml        Physical Exam: Vital Signs Blood pressure 132/76, pulse 70, temperature 97.6 F (36.4 C), temperature source Oral, resp. rate 18, weight 83.9 kg, SpO2 97 %.    General: awake, alert, appropriate, sitting up in bed; tangential sometimes;  NAD HENT: conjugate gaze; oropharynx moist; R temporal crani- staples intact- some bruising- also has no drainage CV: regular rate; no JVD Pulmonary: CTA B/L; no W/R/R- good air movement GI: soft, NT, ND, (+)BS Psychiatric: appropriate;tangential  Musculoskeletal:  Cervical back: Normal range of motionand neck supple.  Comments: RUE- 5/5 except finger abd 5-/5 LUE- 5/5 except finger abd 4+/5 RLE- HF 4+/5, KE 4/5, DF and PF 4-/5 LLE- HF 4-/5, KE 4-/5, DF 2-/5 and PF 2-/5 Skin: General: Skin is warmand dry.  Neurological:  Comments: Patient is alert. Up ambulating to the bathroom with nursing assistance. Makes eye contact with examiner. Provides name and age. Follows simple commands. L inattention noted on exam Intact to light touch in all 4 extremities- Slightly tangential in topics- also slightly delayed in responses Mild  slowed finger to nose on L only   Assessment/Plan: 1. Functional deficits which require 3+ hours per day of interdisciplinary therapy in a comprehensive inpatient rehab setting.  Physiatrist is providing close team supervision and 24 hour management of active medical problems listed below.  Physiatrist and rehab team continue to assess barriers to discharge/monitor patient progress toward functional and medical goals  Care Tool:  Bathing              Bathing assist       Upper Body Dressing/Undressing Upper body dressing   What is the patient wearing?: Hospital gown only    Upper body assist Assist Level: Set up assist    Lower Body Dressing/Undressing Lower body dressing            Lower body assist       Toileting Toileting    Toileting assist Assist for toileting: Minimal Assistance - Patient > 75% (condom cath)     Transfers Chair/bed transfer  Transfers assist           Locomotion Ambulation   Ambulation assist  Walk 10 feet activity   Assist           Walk 50 feet activity   Assist           Walk 150 feet activity   Assist           Walk 10 feet on uneven surface  activity   Assist           Wheelchair     Assist               Wheelchair 50 feet with 2 turns activity    Assist            Wheelchair 150 feet activity     Assist          Blood pressure 132/76, pulse 70, temperature 97.6 F (36.4 C), temperature source Oral, resp. rate 18, weight 83.9 kg, SpO2 97 %.  Medical Problem List and Plan: 1.Debilitysecondary to metastatic melanoma. Status post stereotactic right temporal craniotomy resection of brain metastasis 09/03/2020. Follow-up medical oncology as well as radiation oncologywith radiation therapy as directed. Decadron protocol -patient maynotshower until staples out- can try to wash hair carefully by OT -ELOS/Goals:  12-16 days supervision  First day of therapies- con't CIR 2. Antithrombotics: -DVT/anticoagulation:SCDs. -antiplatelet therapy: N/A 3. Pain Management:Hydrocodone as needed- mainly taking tylenol per pt-  6/8- pt denies pain- con't mainly tylenol prn 4. Mood:Ativan 0.25 mg daily as needed prior to radiationandscans -antipsychotic agents: N/A 5. Neuropsych: This patientiscapable of making decisions on hisown behalf. 6. Skin/Wound Care:Routine skin checks 7. Fluids/Electrolytes/Nutrition:Routine in and outs with follow-up chemistries 8. Seizure prophylaxis. Keppra 500 mg twice daily 9. Hypertension. Lisinopril 10 mg daily, HCTZ 12.5 mg daily. Monitor with increased mobility  6/8- BP well controlled- con't regimen 10. Hyperlipidemia. Lipitor 11. Constipation. Senokot twice daily, Colace twice daily, Dulcolax suppository daily as needed  6/8- no BM in a few days- >3 days- will order Sorbitol if no BM by 5pm- explained this to pt.       LOS: 1 days A FACE TO FACE EVALUATION WAS PERFORMED  Gabriel [name] 09/11/2020, 8:55 AM

## 2020-09-11 NOTE — Progress Notes (Signed)
Patient ID: Gabriel [name], male   DOB: Mar 17, 1931, 87 y.o.   MRN: [id number] Radiation Treatment changed to 2;00 pm today, will alert staff have set up [place] for 1;30 pick up today and also tomorrow 1:45 pick up for 2;15 pm appointment.

## 2020-09-11 NOTE — Progress Notes (Signed)
Inpatient Rehabilitation Care Coordinator Assessment and Plan Patient Details  Name: AUGUSTEN LIPKIN MRN: [id number] Date of Birth: 30-Jul-1930  Today's Date: 09/11/2020  Hospital Problems: Principal Problem:   Metastatic melanoma [facility])  Past Medical History:  Past Medical History:  Diagnosis Date  . Cancer ([place])    melonoma on stomach, lung, brain  . Gait disorder 02/04/2017   cane   . High cholesterol   . Hypertension   . Pneumonia   . Stroke ([place]) 04/2019 last one   gait unsteady- . uses a cane,2-3 mini strokes   Past Surgical History:  Past Surgical History:  Procedure Laterality Date  . APPLICATION OF CRANIAL NAVIGATION N/A 09/03/2020   Procedure: APPLICATION OF CRANIAL NAVIGATION;  Surgeon: [name], MD;  Location: [place];  Service: Neurosurgery;  Laterality: N/A;  RM 20  . BRONCHIAL BIOPSY  08/20/2020   Procedure: BRONCHIAL BIOPSIES;  Surgeon: Garner Nash, DO;  Location: [place] [facility];  Service: Pulmonary;;  . BRONCHIAL BRUSHINGS  08/20/2020   Procedure: BRONCHIAL BRUSHINGS;  Surgeon: Garner Nash, DO;  Location: [place];  Service: Pulmonary;;  . BRONCHIAL NEEDLE ASPIRATION BIOPSY  08/20/2020   Procedure: BRONCHIAL NEEDLE ASPIRATION BIOPSIES;  Surgeon: Garner Nash, DO;  Location: [place];  Service: Pulmonary;;  . BRONCHIAL WASHINGS  08/20/2020   Procedure: BRONCHIAL WASHINGS;  Surgeon: Garner Nash, DO;  Location: [place];  Service: Pulmonary;;  . CRANIOTOMY Right 09/03/2020   Procedure: Stereotactic right temporal craniotomy for resection of tumor;  Surgeon: [name], MD;  Location: [place];  Service: Neurosurgery;  Laterality: Right;  . HEMOSTASIS CONTROL  08/20/2020   Procedure: HEMOSTASIS CONTROL;  Surgeon: Garner Nash, DO;  Location: [place] [facility];  Service: Pulmonary;;  . VIDEO BRONCHOSCOPY WITH ENDOBRONCHIAL NAVIGATION Left 08/20/2020   Procedure: VIDEO BRONCHOSCOPY WITH ENDOBRONCHIAL NAVIGATION;  Surgeon: Garner Nash, DO;  Location: [place];  Service: Pulmonary;  Laterality: Left;  Marland Kitchen VIDEO BRONCHOSCOPY WITH ENDOBRONCHIAL ULTRASOUND Left 08/20/2020   Procedure: VIDEO BRONCHOSCOPY WITH ENDOBRONCHIAL ULTRASOUND;  Surgeon: Garner Nash, DO;  Location: [place];  Service: Pulmonary;  Laterality: Left;   Social History:  reports that he has never smoked. He has never used smokeless tobacco. He reports current alcohol use of about 1.0 standard drink of alcohol per week. He reports that he does not use drugs.  Family / Support Systems Marital Status: Married Patient Roles: Health visitor (Comment) Scientist, forensic) Spouse/Significant Other: [name] [phone]  [phone] Children: Jane-daughter [phone]  Marita Snellen [phone] Other Supports: Friends Anticipated Caregiver: [name] Ability/Limitations of Caregiver: supervision some min concerned about setps to get inside townhome Caregiver Availability: 24/7 Family Dynamics: Close knit family who are involved and supportive. Both children work but will try to assist if possible.  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 Wife confirms he is  a hard worker and enjoys this Recent Psychosocial Issues: recent cancer diagnosis 08/2020 Psychiatric History: No history due to recent cancer diagnosis would benefit from seeing neuro-psych while here. Substance Abuse History: No issues  Patient / Family Perceptions, Expectations & Goals Pt/Family understanding of illness & functional limitations: Pt and wife can explain his surgery and radiation treatments currently. Both have spoken with the MD and feel have a good understanding so far but want to be updated along the way. Premorbid pt/family roles/activities: husband, father, grandfather, Herbalist, church member, Social research officer, government Anticipated changes in roles/activities/participation: resume Pt/family expectations/goals: Pt states: " I hope to do for myself when I leave here."  Wife states: " I hope he is more mobile I worry about the steps getting in, but know you all will be working on this while here."  [employer]: Other (Comment) ([place]) Premorbid Home Care/DME Agencies: Other (Comment) (cane) Transportation available at discharge: Pt did drive but has since stopped wife drives Resource referrals recommended: Neuropsychology  Discharge Planning Living Arrangements: Spouse/significant other Support Systems: Spouse/significant other,Children,Friends/neighbors,Church/faith community Type of Residence: Private residence Insurance 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 She is concerned about his mobility and going up and down steps. Aware team evaluating today and setting goals. Radiation treatment  was changed to this afternoon and will miss some of his evlautions. Team aware of this. Care Coordinator Barriers to Discharge: Decreased caregiver support Care Coordinator Barriers to Discharge Comments: Wife can do some min assist levle not mod Care Coordinator Anticipated Follow Up Needs: HH/OP  Clinical Impression Pleasant but confused gentleman who is motivated to be independent but does not realize his safety issues and lacks awareness of his deficits. Wife is involved and willing to assist. Will await therapy evaluations and work on discharge needs.   Elease Hashimoto 09/11/2020, 11:09 AM

## 2020-09-11 NOTE — Discharge Instructions (Addendum)
Inpatient Rehab Discharge Instructions  [name] Discharge date and time: No discharge date for patient encounter.   Activities/Precautions/ Functional Status: Activity: activity as tolerated Diet: regular diet Wound Care: Routine skin checks Functional status:  ___ No restrictions     ___ Walk up steps independently ___ 24/7 supervision/assistance   ___ Walk up steps with assistance ___ Intermittent supervision/assistance  ___ Bathe/dress independently ___ Walk with walker     _x__ Bathe/dress with assistance ___ Walk Independently    ___ Shower independently ___ Walk with assistance    ___ Shower with assistance ___ No alcohol     ___ Return to work/school ________  Special Instructions:  No driving smoking or alcohol    COMMUNITY REFERRALS UPON DISCHARGE:    Home Health:   PT, OT, [place] Phone: [phone]  Medical Equipment/Items Ordered: WHEELCHAIR, 3 IN 1                                                 Agency/Supplier: [facility]  [phone]  OUTPATIENT PALLIATIVE CARE FOLLOW WILL CALL WIFE TO SCHEDULE APPOINTMENT WIFE HIRING PRIVATE DUTY TO ASSIST WITH PATIENT'S CARE AT HOME     My questions have been answered and I understand these instructions. I will adhere to these goals and the provided educational materials after my discharge from the hospital.  Patient/Caregiver Signature _______________________________ Date __________  Clinician Signature _______________________________________ Date __________  Please bring this form and your medication list with you to all your follow-up doctor's appointments.

## 2020-09-11 NOTE — Evaluation (Signed)
Occupational Therapy Assessment and Plan  Patient Details  Name: Gabriel [name] MRN: [id number] Date of Birth: 1930/05/02  OT Diagnosis: abnormal posture, cognitive deficits, disturbance of vision and muscle weakness (generalized) Rehab Potential: Rehab Potential (ACUTE ONLY): Good ELOS: 12-16 days   Today's Date: 09/11/2020 OT Individual Time: 2706-2376 OT Individual Time Calculation (min): 31 min  and Today's Date: 09/11/2020 OT Missed Time: 29 Minutes Missed Time Reason: Other (comment) (rescheduled sessions due to change in radiation schedule)    Hospital Problem: Principal Problem:   Metastatic melanoma ([place])   Past Medical History:  Past Medical History:  Diagnosis Date  . Cancer ([place])    melonoma on stomach, lung, brain  . Gait disorder 02/04/2017   cane   . High cholesterol   . Hypertension   . Pneumonia   . Stroke ([place]) 04/2019 last one   gait unsteady- . uses a cane,2-3 mini strokes   Past Surgical History:  Past Surgical History:  Procedure Laterality Date  . APPLICATION OF CRANIAL NAVIGATION N/A 09/03/2020   Procedure: APPLICATION OF CRANIAL NAVIGATION;  Surgeon: [name], MD;  Location: [facility];  Service: Neurosurgery;  Laterality: N/A;  RM 20  . BRONCHIAL BIOPSY  08/20/2020   Procedure: BRONCHIAL BIOPSIES;  Surgeon: Garner Nash, DO;  Location: [facility];  Service: Pulmonary;;  . BRONCHIAL BRUSHINGS  08/20/2020   Procedure: BRONCHIAL BRUSHINGS;  Surgeon: Garner Nash, DO;  Location: [facility];  Service: Pulmonary;;  . BRONCHIAL NEEDLE ASPIRATION BIOPSY  08/20/2020   Procedure: BRONCHIAL NEEDLE ASPIRATION BIOPSIES;  Surgeon: Garner Nash, DO;  Location: [facility];  Service: Pulmonary;;  . BRONCHIAL WASHINGS  08/20/2020   Procedure: BRONCHIAL WASHINGS;  Surgeon: Garner Nash, DO;  Location: [facility];  Service: Pulmonary;;  . CRANIOTOMY Right 09/03/2020   Procedure: Stereotactic right temporal craniotomy for resection of tumor;   Surgeon: [name], MD;  Location: [facility];  Service: Neurosurgery;  Laterality: Right;  . HEMOSTASIS CONTROL  08/20/2020   Procedure: HEMOSTASIS CONTROL;  Surgeon: Garner Nash, DO;  Location: [facility];  Service: Pulmonary;;  . VIDEO BRONCHOSCOPY WITH ENDOBRONCHIAL NAVIGATION Left 08/20/2020   Procedure: VIDEO BRONCHOSCOPY WITH ENDOBRONCHIAL NAVIGATION;  Surgeon: Garner Nash, DO;  Location: [facility];  Service: Pulmonary;  Laterality: Left;  Marland Kitchen VIDEO BRONCHOSCOPY WITH ENDOBRONCHIAL ULTRASOUND Left 08/20/2020   Procedure: VIDEO BRONCHOSCOPY WITH ENDOBRONCHIAL ULTRASOUND;  Surgeon: Garner Nash, DO;  Location: [facility];  Service: Pulmonary;  Laterality: Left;    Assessment & Plan Clinical Impression: Patient is a 87 y.o. right-handed male with history of hyperlipidemia, hypertension, CVA with unsteady gait maintained on Plavix. Per chart review patient lives with spouse. Independent with assistive device. 1 level home 2 steps to entry. 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 CT scan revealed left upper lobe mass 7.8 x 6.8 cm in size. A small left hilar node was also identified. Additionally there was renal masses concerning for primary renal disease versus metastatic disease. MRI of the brain demonstrated 3 intracranial metastasesincluding 2 small subcentimeter metastasesand a larger right hemorrhagic lesion. Patient was established with medical oncology Dr. Mohammed/radiation oncology Dr. Lisbeth Renshaw. PET scan completed showing hypermetabolic lingular mass with central necrosis consistent with primary bronchogenic carcinoma. No evidence of mediastinal nodal metastasis. 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 Patient was admitted 09/03/2020 and underwent stereotactic right temporal craniotomy for resection of brain metastasis per Dr.  Nundkumar.  Surgical pathology report consistent with metastatic melanoma. Maintained on Keppra for seizure prophylaxis. Decadron protocol as indicated. Medical oncology as well as radiation oncology continue to follow with radiation therapy as directed. Tolerating a regular consistency diet.No current plan at this time for DVT prophylaxis due to some bleeding hemorrhoids.Therapy evaluations completed due to patient decreased functional mobility patient was admitted for a comprehensive rehab program.   Patient transferred to CIR on 09/10/2020 .    Patient currently requires mod with basic self-care skills secondary to muscle weakness, decreased cardiorespiratoy endurance, motor apraxia and decreased coordination, decreased attention to left, decreased attention, decreased awareness, decreased problem solving, decreased safety awareness and decreased memory and decreased standing balance and decreased balance strategies.  Prior to hospitalization, patient could complete ADLs with modified independent .  Patient will benefit from skilled intervention to increase independence with basic self-care skills prior to discharge home with care partner.  Anticipate patient will require 24 hour supervision and follow up home health.  OT - End of Session Activity Tolerance: Tolerates 30+ min activity with multiple rests Endurance Deficit: Yes OT Assessment Rehab Potential (ACUTE ONLY): Good OT Patient demonstrates impairments in the following area(s): Balance;Cognition;Endurance;Motor;Pain;Safety;Perception;Vision OT Basic ADL's Functional Problem(s): Grooming;Bathing;Dressing;Toileting OT Transfers Functional Problem(s): Toilet;Tub/Shower OT Additional Impairment(s): Fuctional Use of Upper Extremity OT Plan OT Intensity: Minimum of 1-2 x/day, 45 to 90 minutes OT Frequency: 5 out of 7 days OT Duration/Estimated Length of Stay: 12-16 days OT Treatment/Interventions: Balance/vestibular training;Cognitive  remediation/compensation;Community reintegration;Discharge planning;Disease mangement/prevention;DME/adaptive equipment instruction;Functional mobility training;Neuromuscular re-education;Pain management;Patient/family education;Psychosocial support;Self Care/advanced ADL retraining;Skin care/wound managment;Therapeutic Activities;Therapeutic Exercise;UE/LE Coordination activities;Visual/perceptual remediation/compensation OT Basic Self-Care Anticipated Outcome(s): Supervision OT Toileting Anticipated Outcome(s): Supervision OT Bathroom Transfers Anticipated Outcome(s): Supervision OT Recommendation Recommendations for Other Services: Neuropsych consult;Therapeutic Recreation consult Therapeutic Recreation Interventions: Stress management;Other (comment) (coping) Patient destination: Home Follow Up Recommendations: Home health OT;Outpatient OT;24 hour supervision/assistance (home health vs outpatient) Equipment Recommended: Tub/shower seat   OT Evaluation Precautions/Restrictions  Precautions Precautions: Fall Precaution Comments: crani Restrictions Weight Bearing Restrictions: No General OT Amount of Missed Time: 29 Minutes Vital Signs Therapy Vitals Temp: 98 F (36.7 C) Temp Source: Oral Pulse Rate: 75 Resp: 16 BP: 118/69 Patient Position (if appropriate): Lying Oxygen Therapy SpO2: (!) 82 % O2 Device: Room Air Pain Pain Assessment Pain Scale: 0-10 Pain Score: 0-No pain Home Living/Prior Functioning Home Living Family/patient expects to be discharged to:: Private residence Living Arrangements: Spouse/significant other Available Help at Discharge: Family,Available 24 hours/day Type of Home: House (one level condo) Home Access: Stairs to enter [employer] of Steps: 2 + 1 + 1 Entrance Stairs-Rails: Can reach both Home Layout: One level Bathroom Shower/Tub: Walk-in shower (Small ledge to step over) Biochemist, clinical: Standard Bathroom Accessibility: Yes  Lives  With: Spouse IADL History Homemaking Responsibilities: Yes Meal Prep Responsibility: Secondary (have been eating out recently) Laundry Responsibility: Secondary Cleaning Responsibility: Primary (Washing dishes and taking trash out) [employer] Paying/Finance Responsibility: Secondary Shopping Responsibility: Secondary Child Care Responsibility: Secondary Homemaking Comments: Pt can do all homemaking tasks if need be, has a maid who does majority of housemaking responsibilites Current License: Yes Mode of Transportation: Car Prior Function Level of Independence: Requires assistive device for independence  Able to Take Stairs?: Yes Driving: Yes Vocation: Part time employment Vocation Requirements: sells life insurance, recently wrote and published a "memoir" Comments: Wife recently started driving Vision Baseline Vision/History: Wears glasses Wears Glasses: Reading only Patient Visual Report: No change from baseline Additional Comments: would benefit from additional assessment during  functional and structed tasks Praxis Praxis: Impaired Praxis Impairment Details: Motor planning Cognition Overall Cognitive Status: Impaired/Different from baseline Arousal/Alertness: Awake/alert Orientation Level: Person;Place;Situation Person: Oriented Place: Oriented Situation: Oriented Year: 2022 Month: June Day of Week: Incorrect (Thursday) Memory: Impaired Memory Impairment: Decreased recall of new information Immediate Memory Recall: Sock;Blue;Bed Memory Recall Sock: Not able to recall Memory Recall Blue: With Cue Memory Recall Bed: Without Cue Attention: Sustained Sustained Attention: Impaired Sustained Attention Impairment: Verbal basic;Functional basic Awareness: Impaired Awareness Impairment: Intellectual impairment;Emergent impairment Problem Solving: Impaired Problem Solving Impairment: Functional basic;Verbal basic Safety/Judgment: Impaired Sensation Sensation Light Touch:  Appears Intact Proprioception: Appears Intact Coordination Gross Motor Movements are Fluid and Coordinated: No Fine Motor Movements are Fluid and Coordinated: No Coordination and Movement Description: mild intention temmor in BUE. mild dysmetria in BUE LUE>RUE Finger Nose Finger Test: intention tremmor BUE. decreased speed with LUE compared to R Heel Shin Test: mild dysmetria in the LLE Motor  Motor Motor: Ataxia;Hemiplegia Motor - Skilled Clinical Observations: L sided hemiplegia  Trunk/Postural Assessment  Cervical Assessment Cervical Assessment: Exceptions to [facility] (forward head and cervical flexion) Thoracic Assessment Thoracic Assessment: Exceptions to [facility] (rounded shoulders mild scoliosis) Lumbar Assessment Lumbar Assessment: Exceptions to WFL (decreaed lordosis) Postural Control Postural Control: Deficits on evaluation (L/posterior bias, in standing)  Balance Balance Balance Assessed: Yes Dynamic Sitting Balance Dynamic Sitting - Balance Support: Feet supported;During functional activity;No upper extremity supported Dynamic Sitting - Level of Assistance: 5: Stand by assistance Dynamic Sitting - Balance Activities: Forward lean/weight shifting;Reaching for objects Sitting balance - Comments: during bathing and dressing tasks Dynamic Standing Balance Dynamic Standing - Balance Support: During functional activity Dynamic Standing - Level of Assistance: 4: Min assist Dynamic Standing - Balance Activities: Forward lean/weight shifting;Lateral lean/weight shifting Dynamic Standing - Comments: during bathing and dressing tasks Extremity/Trunk Assessment RUE Assessment RUE Assessment: Within Functional Limits General Strength Comments: grossly 4+/5 LUE Assessment LUE Assessment: Within Functional Limits General Strength Comments: grossly 4+/5  Care Tool Care Tool Self Care Eating        Oral Care  Oral care, brush teeth, clean dentures activity did not occur: Refused       Bathing   Body parts bathed by patient: Right arm;Left arm;Chest;Abdomen;Front perineal area;Right upper leg;Left upper leg;Face Body parts bathed by helper: Buttocks;Right lower leg;Left lower leg   Assist Level: Moderate Assistance - Patient 50 - 74%    Upper Body Dressing(including orthotics)   What is the patient wearing?: Button up shirt   Assist Level: Maximal Assistance - Patient 25 - 49%    Lower Body Dressing (excluding footwear)   What is the patient wearing?: Underwear/pull up;Pants Assist for lower body dressing: Moderate Assistance - Patient 50 - 74%    Putting on/Taking off footwear             Care Tool Toileting Toileting activity   Assist for toileting: Maximal Assistance - Patient 25 - 49%     Care Tool Bed Mobility Roll left and right activity   Roll left and right assist level: Minimal Assistance - Patient > 75%    Sit to lying activity   Sit to lying assist level: Minimal Assistance - Patient > 75%    Lying to sitting edge of bed activity   Lying to sitting edge of bed assist level: Minimal Assistance - Patient > 75%     Care Tool Transfers Sit to stand transfer   Sit to stand assist level: Minimal Assistance - Patient > 75%  Chair/bed transfer   Chair/bed transfer assist level: Minimal Assistance - Patient > 75%     Toilet transfer   Assist Level: Minimal Assistance - Patient > 75%     Care Tool Cognition Expression of Ideas and Wants Expression of Ideas and Wants: Without difficulty (complex and basic) - expresses complex messages without difficulty and with speech that is clear and easy to understand   Understanding Verbal and Non-Verbal Content Understanding Verbal and Non-Verbal Content: Understands (complex and basic) - clear comprehension without cues or repetitions   Memory/Recall Ability *first 3 days only Memory/Recall Ability *first 3 days only: Current season;Staff names and faces;That he or she is in a hospital/hospital unit     Refer to Care Plan for [place] 1 OT Short Term Goal 1 (Week 1): Pt will complete bathing at shower level with CGA OT Short Term Goal 2 (Week 1): Pt will complete LB dressing (Including footwear) with CGA OT Short Term Goal 3 (Week 1): Pt will complete UB dressing with min assist OT Short Term Goal 4 (Week 1): Pt will complete 2 grooming tasks in standing at sink to demonstrate increased standing balance and tolerance  Recommendations for other services: Neuropsych and Therapeutic Recreation  Stress management and Other coping   Skilled Therapeutic Intervention OT eval completed with discussion of rehab process, OT purpose, POC, ELOS, and goals.  ADL assessment completed with focus on dynamic sitting and standing balance.  Pt completed bed mobility Min A - CGA.  Engaged in bathing and dressing at EOB while assessing ROM, strength, and balance.  Pt demonstrating decreased recall during BIMS assessment.  Pt required assistance for LB dressing due to possible mild L inattention and pt undershooting when attempting to thread L pant leg.  Pt required min assist for sit > stand and cues for upright standing posture as pt with tendency to stand hunched forward.  Pt ambulated to toilet, initially with shuffled gait on LLE and attempting to furniture walk.  Pt required min assist and cues for upright posture.  Pt required assistance with clothing management prior to toileting.  Pt remained on toilet in handoff to PT for evaluation.   ADL ADL Eating: Independent Where Assessed-Eating: Edge of bed Upper Body Bathing: Contact guard Where Assessed-Upper Body Bathing: Edge of bed Lower Body Bathing: Minimal assistance Where Assessed-Lower Body Bathing: Edge of bed Upper Body Dressing: Maximal assistance Where Assessed-Upper Body Dressing: Edge of bed Lower Body Dressing: Minimal assistance Where Assessed-Lower Body Dressing: Edge of bed Toilet Transfer: Minimal  assistance Toilet Transfer Method: Counselling psychologist: Bedside commode Mobility  Bed Mobility Bed Mobility: Supine to Sit Supine to Sit: Minimal Assistance - Patient > 75% Transfers Sit to Stand: Minimal Assistance - Patient > 75% Stand to Sit: Minimal Assistance - Patient > 75%   Discharge Criteria: Patient will be discharged from OT if patient refuses treatment 3 consecutive times without medical reason, if treatment goals not met, if there is a change in medical status, if patient makes no progress towards goals or if patient is discharged from hospital.  The above assessment, treatment plan, treatment alternatives and goals were discussed and mutually agreed upon: by patient and by family  Ellwood Dense [facility] 09/11/2020, 2:56 PM

## 2020-09-11 NOTE — Progress Notes (Signed)
Met with the patient and wife to review role of the nurse CM and initiate education. Discussed situation and concerns. Patient denied headache, vision issues but is concerned about his balance and really wants to shower. Reviewed elevated triglyceride level; reported he was taking lipitor prior to admission. Given handouts on dietary modifications for HTN and HLD (Trig 280) as well as calcium down to (8.2). Continue to follow along to discharge to address educational needs. Collaborate with the SW to facilitate preparation for discharge. Margarito Liner, RN

## 2020-09-12 ENCOUNTER — Ambulatory Visit
Admission: RE | Admit: 2020-09-12 | Discharge: 2020-09-12 | Disposition: A | Discharge status: Home / Self Care | Payer: Medicare Other | Source: Ambulatory Visit | Attending: Radiation Oncology | Admitting: Radiation Oncology

## 2020-09-12 ENCOUNTER — Encounter: Payer: Self-pay | Admitting: Radiation Oncology

## 2020-09-12 MED ORDER — HYDROCODONE-ACETAMINOPHEN 5-325 MG PO TABS: 1.0000 | ORAL_TABLET | Freq: Four times a day (QID) | ORAL | Status: DC | PRN

## 2020-09-12 NOTE — Plan of Care (Signed)
  Problem: RH BOWEL ELIMINATION Goal: RH STG MANAGE BOWEL WITH ASSISTANCE Description: STG Manage Bowel with mod I Assistance. Outcome: Progressing; patient had a x1 bloody stool this morning per wife it maybe his hemorrhoids again; MD notified

## 2020-09-12 NOTE — Progress Notes (Signed)
Physical Therapy Session Note  Patient Details  Name: Gabriel [name] MRN: [id number] Date of Birth: February 07, 1931  Today's Date: 09/12/2020 PT Individual Time: 4098-1191 PT Individual Time Calculation (min): 69 min   Short Term Goals: Week 1:  PT Short Term Goal 1 (Week 1): Pt will ambulate 163ft with CGA PT Short Term Goal 2 (Week 1): Pt will perform bed mobility with supervision assist PT Short Term Goal 3 (Week 1): Pt will improve Berg balance score to >35 to indicate improved safety with functional mobility PT Short Term Goal 4 (Week 1): Pt will propell WC >155ft with supervision assist  Skilled Therapeutic Interventions/Progress Updates:   Received pt sitting in recliner with wife present at bedside, pt agreeable to PT treatment, and denied any pain during session. Session with focus on functional mobility/transfers, generalized strengthening, dynamic standing balance/coordination, ambulation, and improved activity tolerance. Provided pt with 18x18 WC however no cushion available. Pt transferred sit<>stand with CGA and ambulated 55ft with min handheld assist to WC. Pt demonstrated decreased stride length, decreased foot clearance, and wide BOS. Doffed hospital socks and donned regular socks with supervision and increased time and donned shoes with max A for time management purposes. Pt transported to 81M ortho gym in [facility] total A for time management purposes. Pt performed ambulatory simulated car transfer without AD and mod A with 1 L lateral LOB due to L toe catching when walking to car; cues for hand placement on stable surfaces of car. Pt then navigated 8 steps with 2 rails with min A ascending with a step through and descending with a step to pattern. Pt transferred on/off Nustep without AD and min A and performed BUE/LE strengthening on Nustep at workload 3 for 8 minutes for a total of 305 steps for improved cardiovascular endurance. Pt transported back to room in [facility] total A and ambulated 30ft  with RW and CGA to recliner. Concluded session with pt sitting in recliner, needs within reach, and chair pad alarm on.   Therapy Documentation Precautions:  Precautions Precautions: Fall Precaution Comments: crani Restrictions Weight Bearing Restrictions: No  Therapy/Group: Individual Therapy Alfonse Alpers PT, DPT   09/12/2020, 7:35 AM

## 2020-09-12 NOTE — Progress Notes (Signed)
Occupational Therapy Session Note  Patient Details  Name: Gabriel [name] MRN: [id number] Date of Birth: Nov 09, 1930  Today's Date: 09/12/2020 OT Individual Time: 8366-2947 OT Individual Time Calculation (min): 40 min    Short Term Goals: Week 1:  OT Short Term Goal 1 (Week 1): Pt will complete bathing at shower level with CGA OT Short Term Goal 2 (Week 1): Pt will complete LB dressing (Including footwear) with CGA OT Short Term Goal 3 (Week 1): Pt will complete UB dressing with min assist OT Short Term Goal 4 (Week 1): Pt will complete 2 grooming tasks in standing at sink to demonstrate increased standing balance and tolerance    Skilled Therapeutic Interventions/Progress Updates:    Pt greeted at time of session supine in bed resting no pain, agreeable to OT session. Supine > sit EOB CGA and sat EOB to don button up shirt with Mod A for buttons and to fully thread last UE through. Donned shorts with CGA able to reach forward and thread and stand to don over hips. Supervision for non slip socks. Sit <> stands CGA throughout, ambulating bed > door > sink for standing oral hygiene > recliner. Set up with alarm on call bell in reach.  Therapy Documentation Precautions:  Precautions Precautions: Fall Precaution Comments: crani Restrictions Weight Bearing Restrictions: No     Therapy/Group: Individual Therapy  Viona Gilmore 09/12/2020, 7:16 AM

## 2020-09-12 NOTE — Progress Notes (Signed)
Speech Language Pathology Daily Session Note  Patient Details  Name: Gabriel [name] MRN: [id number] Date of Birth: 1930-12-01  Today's Date: 09/12/2020 SLP Individual Time: 0761-5183 SLP Individual Time Calculation (min): 45 min  Short Term Goals: Week 1: SLP Short Term Goal 1 (Week 1): Pt will demonstrate sustained attention in 5-7 minute intervals with supervision Gabriel verbal cues, SLP Short Term Goal 2 (Week 1): Pt will demonstrate basic problem solving skills in familiar tasks with min Gabriel verbal cues. SLP Short Term Goal 3 (Week 1): Pt will demonstrate self-monitoring in basic problem solving tasks with max Gabriel verbal cues. SLP Short Term Goal 4 (Week 1): Pt will demonstrate recall of novel, functional information with mod Gabriel verbal cues for compensatory aids.  Skilled Therapeutic Interventions:   Skilled SLP intervention focused on cognition. ALFA money calcuation competed with increased time and mod Gabriel for organization to calculate coin and bill amounts. He increased accuracy with SLP segmenting coins into dollar amounts for patient to combine with visual and verbal direction. Pt completed ALFA calendar subtest with min Gabriel for identifying written information and understanding events listed on calendar.  Wife present this session who confirmed that patient was much faster with counting money prior to surgery/ hospitalization. Cont with therapy per plan of care.   Pain Pain Assessment Pain Scale: Faces Faces Pain Scale: No hurt  Therapy/Group: Individual Therapy  Gabriel [name] Gabriel [name] 09/12/2020, 10:40 AM

## 2020-09-12 NOTE — Progress Notes (Signed)
PROGRESS NOTE   Subjective/Complaints: Patient says blood with stool is normal for him due to hemorrhoids- not painful Was working in [employer] prior to admission! Lives with wife who can provide assistance at home, 48 years He denies pain  ROS:  Pt Denies SOB, abd pain, CP, N/V/C/D, and vision changes  Objective:   No results found. Recent Labs    09/11/20 0532  WBC 6.0  HGB 10.0*  HCT 31.8*  PLT 170   Recent Labs    09/11/20 0532  NA 135  K 4.3  CL 100  CO2 26  GLUCOSE 117*  BUN 30*  CREATININE 0.93  CALCIUM 8.0*    Intake/Output Summary (Last 24 hours) at 09/12/2020 1330 Last data filed at 09/12/2020 1300 Gross per 24 hour  Intake 100 ml  Output 650 ml  Net -550 ml        Physical Exam: Vital Signs Blood pressure 132/79, pulse 70, temperature 98.7 F (37.1 C), resp. rate 16, weight 83.9 kg, SpO2 100 %. Gen: no distress, normal appearing HEENT: oral mucosa pink and moist, NCAT Cardio: Reg rate Chest: normal effort, normal rate of breathing Abd: soft, non-distended Ext: no edema Psychiatric: appropriate;tangential  Musculoskeletal:     Cervical back: Normal range of motion and neck supple.     Comments: RUE- 5/5 except finger abd 5-/5 LUE- 5/5 except finger abd 4+/5 RLE- HF 4+/5, KE 4/5, DF and PF 4-/5 LLE- HF 4-/5, KE 4-/5, DF 2-/5 and PF 2-/5  Skin:    General: Skin is warm and dry.   Neurological:     Comments: Patient is alert.  Up ambulating to the bathroom with nursing assistance.  Makes eye contact with examiner.  Provides name and age.  Follows simple commands. L inattention noted on exam Intact to light touch in all 4 extremities- Slightly tangential in topics- also slightly delayed in responses Mild slowed finger to nose on L only    Assessment/Plan: 1. Functional deficits which require 3+ hours per day of interdisciplinary therapy in a comprehensive inpatient rehab  setting. Physiatrist is providing close team supervision and 24 hour management of active medical problems listed below. Physiatrist and rehab team continue to assess barriers to discharge/monitor patient progress toward functional and medical goals  Care Tool:  Bathing    Body parts bathed by patient: Right arm, Left arm, Chest, Abdomen, Front perineal area, Right upper leg, Left upper leg, Face   Body parts bathed by helper: Buttocks, Right lower leg, Left lower leg     Bathing assist Assist Level: Moderate Assistance - Patient 50 - 74%     Upper Body Dressing/Undressing Upper body dressing   What is the patient wearing?: Button up shirt    Upper body assist Assist Level: Moderate Assistance - Patient 50 - 74%    Lower Body Dressing/Undressing Lower body dressing      What is the patient wearing?: Pants     Lower body assist Assist for lower body dressing: Contact Guard/Touching assist     Toileting Toileting    Toileting assist Assist for toileting: Maximal Assistance - Patient 25 - 49%     Transfers Chair/bed  transfer  Transfers assist     Chair/bed transfer assist level: Minimal Assistance - Patient > 75%     Locomotion Ambulation   Ambulation assist      Assist level: Minimal Assistance - Patient > 75% Assistive device: Hand held assist Max distance: 15   Walk 10 feet activity   Assist     Assist level: Minimal Assistance - Patient > 75% Assistive device: Hand held assist   Walk 50 feet activity   Assist Walk 50 feet with 2 turns activity did not occur: Safety/medical concerns         Walk 150 feet activity   Assist Walk 150 feet activity did not occur: Safety/medical concerns         Walk 10 feet on uneven surface  activity   Assist Walk 10 feet on uneven surfaces activity did not occur: Safety/medical concerns         Wheelchair     Assist        Wheelchair assist level: Minimal Assistance - Patient >  75%      Wheelchair 50 feet with 2 turns activity    Assist        Assist Level: Minimal Assistance - Patient > 75%   Wheelchair 150 feet activity     Assist      Assist Level: Minimal Assistance - Patient > 75%   Blood pressure 132/79, pulse 70, temperature 98.7 F (37.1 C), resp. rate 16, weight 83.9 kg, SpO2 100 %.  Medical Problem List and Plan: 1.  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  Status post stereotactic right temporal craniotomy resection of brain metastasis 09/03/2020.  Follow-up medical oncology as well as radiation oncology with radiation therapy as directed.  Decadron protocol             -patient may not shower until staples out- can try to wash hair carefully by OT             -ELOS/Goals: 12-16 days supervision  Continue CIR 2.  Antithrombotics: -DVT/anticoagulation: SCDs.             -antiplatelet therapy: N/A 3. Pain Management: Hydrocodone as needed- mainly taking tylenol per pt-   6/9- pt denies pain- continue mainly tylenol prn. Decrease Norco to q6H prn.  4. Mood: Ativan 0.25 mg daily as needed prior to radiation and scans             -antipsychotic agents: N/A 5. Neuropsych: This patient is capable of making decisions on his own behalf. 6. Skin/Wound Care: Routine skin checks 7. Fluids/Electrolytes/Nutrition: Routine in and outs with follow-up chemistries 8.  Seizure prophylaxis.  Keppra 500 mg twice daily 9.  Hypertension.  Lisinopril 10 mg daily, HCTZ 12.5 mg daily.  Monitor with increased mobility  6/8-6/9 BP well controlled- continue regimen 10.  Hyperlipidemia.  Lipitor 11.  Constipation.  Senokot twice daily, Colace twice daily, Dulcolax suppository daily as needed  6/8- no BM in a few days- >3 days- will order Sorbitol if no BM by 5pm- explained this to pt.  12. Hemorrhoids: bleeding, painless, discussed hgb stable, repeat Monday        LOS: 2 days A FACE TO FACE EVALUATION WAS PERFORMED  Kim Oki P Scarleth Brame 09/12/2020,  1:30 PM

## 2020-09-13 DIAGNOSIS — I10 Essential (primary) hypertension: Secondary | ICD-10-CM

## 2020-09-13 DIAGNOSIS — K5901 Slow transit constipation: Secondary | ICD-10-CM

## 2020-09-13 DIAGNOSIS — G8918 Other acute postprocedural pain: Secondary | ICD-10-CM

## 2020-09-13 LAB — GLUCOSE, CAPILLARY: Glucose-Capillary: 108 mg/dL — ABNORMAL HIGH (ref 70–99)

## 2020-09-13 MED ORDER — HYDROCODONE-ACETAMINOPHEN 5-325 MG PO TABS
1.0000 | ORAL_TABLET | Freq: Two times a day (BID) | ORAL | Status: DC | PRN
Start: 2020-09-13 — End: 2020-09-16

## 2020-09-13 NOTE — Progress Notes (Signed)
Occupational Therapy Session Note  Patient Details  Name: Gabriel [name] MRN: [id number] Date of Birth: [birth year]  Today's Date: 09/13/2020 OT Individual Time: 8366-2947 OT Individual Time Calculation (min): 39 min    Short Term Goals: Week 1:  OT Short Term Goal 1 (Week 1): Pt will complete bathing at shower level with CGA OT Short Term Goal 2 (Week 1): Pt will complete LB dressing (Including footwear) with CGA OT Short Term Goal 3 (Week 1): Pt will complete UB dressing with min assist OT Short Term Goal 4 (Week 1): Pt will complete 2 grooming tasks in standing at sink to demonstrate increased standing balance and tolerance  Skilled Therapeutic Interventions/Progress Updates:    Pt received in recliner ready for therapy. Pt seen this session to work on activity tolerance, LLE coordination and awareness, and balance. Pt worked on sit to stands from Psychologist, occupational with light CGA and then practiced ambulation into hallway with min A initially but quickly fading to mod, even max A after 20 feet. He was dragging his left foot and having difficulty coordinating the walker.  Once back in room, pt rested  in wc. Asked pt if he was tired or light headed and pt said he was "fine".  Pt then worked on dynamic balance exercises from sitting and  then moved to standing (reaching forward, trunk rotations).  He then ambulated to bathoom with good stepping pattern and requested to sit on regular toilet vs elevated BSC.  Min A to sit slowly and safely on low toilet but then could stand up with bar with CGA.  S with cleansing and clothing management.   His wife arrived at this point and pt did a beautiful job walking in a very smooth coordinated pattern to sit back in recliner. Pt given a cloth to wash hands.  Pt resting in recliner with all needs met and chair alarm on.  Discussed with wife how he did earlier and that fatigue may be a factor.   Therapy Documentation Precautions:  Precautions Precautions:  Fall Precaution Comments: crani Restrictions Weight Bearing Restrictions: No    Pain: Pain Assessment Pain Scale: 0-10 Pain Score: 0-No pain ADL: ADL Eating: Independent Where Assessed-Eating: Edge of bed Upper Body Bathing: Contact guard Where Assessed-Upper Body Bathing: Edge of bed Lower Body Bathing: Minimal assistance Where Assessed-Lower Body Bathing: Edge of bed Upper Body Dressing: Maximal assistance Where Assessed-Upper Body Dressing: Edge of bed Lower Body Dressing: Minimal assistance Where Assessed-Lower Body Dressing: Edge of bed Toilet Transfer: Minimal assistance Toilet Transfer Method: Insurance claims handler Equipment: Bedside commode  Therapy/Group: Individual Therapy  [place] 09/13/2020, 1:05 PM

## 2020-09-13 NOTE — IPOC Note (Signed)
Overall Plan of Care [facility]) Patient Details Name: Gabriel [name] MRN: [id number] DOB: [birth year]  Admitting Diagnosis: Metastatic melanoma [facility])  Hospital Problems: Principal Problem:   Metastatic melanoma ([place])     Functional Problem List: Nursing Safety, Medication Management, Bowel, Bladder, Pain, Endurance, Nutrition  PT Balance, Behavior, Edema, Endurance, Motor, Perception, Safety, Skin Integrity  OT Balance, Cognition, Endurance, Motor, Pain, Safety, Perception, Vision  SLP Cognition  TR         Basic ADL's: OT Grooming, Bathing, Dressing, Toileting     Advanced  ADL's: OT       Transfers: PT Bed Mobility, Bed to Chair, Car, Furniture, Floor  OT Toilet, Tub/Shower     Locomotion: PT Ambulation, Emergency planning/management officer, Stairs     Additional Impairments: OT Fuctional Use of Upper Extremity  SLP Social Cognition   Problem Solving, Memory, Attention, Awareness  TR      Anticipated Outcomes Item Anticipated Outcome  Self Feeding    Swallowing      Basic self-care  Supervision  Toileting  Supervision   Bathroom Transfers Supervision  Bowel/Bladder  manage bowel and bladder with mod I assist  Transfers  Supervision assist with LRAD  Locomotion  ambulatory with LRAD at supervision assist level  Communication     Cognition  Mod A emergent awareness, min-supervision A  Pain  pain at or below level 4  Safety/Judgment  maintain safety with cues/reminders   Therapy Plan: PT Intensity: Minimum of 1-2 x/day ,45 to 90 minutes PT Frequency: 5 out of 7 days PT Duration Estimated Length of Stay: 10-12 days OT Intensity: Minimum of 1-2 x/day, 45 to 90 minutes OT Frequency: 5 out of 7 days OT Duration/Estimated Length of Stay: 12-16 days SLP Intensity: Minumum of 1-2 x/day, 30 to 90 minutes SLP Frequency: 3 to 5 out of 7 days SLP Duration/Estimated Length of Stay: 12-16 days   Due to the current state of emergency, patients may not be receiving their  3-hours of Medicare-mandated therapy.   Team Interventions: Nursing Interventions Patient/Family Education, Bowel Management, Pain Management, Medication Management, Disease Management/Prevention, Bladder Management  PT interventions Ambulation/gait training, Community reintegration, DME/adaptive equipment instruction, Neuromuscular re-education, Psychosocial support, Stair training, UE/LE Strength taining/ROM, Wheelchair propulsion/positioning, Training and development officer, Discharge planning, Functional electrical stimulation, Pain management, Skin care/wound management, Therapeutic Activities, UE/LE Coordination activities, Cognitive remediation/compensation, Disease management/prevention, Functional mobility training, Splinting/orthotics, Patient/family education, Therapeutic Exercise, Visual/perceptual remediation/compensation  OT Interventions Training and development officer, Cognitive remediation/compensation, Community reintegration, Discharge planning, Disease mangement/prevention, DME/adaptive equipment instruction, Functional mobility training, Neuromuscular re-education, Pain management, Patient/family education, Psychosocial support, Self Care/advanced ADL retraining, Skin care/wound managment, Therapeutic Activities, Therapeutic Exercise, UE/LE Coordination activities, Visual/perceptual remediation/compensation  SLP Interventions Cognitive remediation/compensation, Cueing hierarchy, Functional tasks, Internal/external aids, Patient/family education  TR Interventions    SW/CM Interventions Discharge Planning, Psychosocial Support, Patient/Family Education   Barriers to Discharge MD  Medical stability  Nursing Decreased caregiver support, Home environment access/layout, Incontinence 1 level 2ste home with wife  PT Inaccessible home environment, Home environment Child psychotherapist, Insurance for SNF coverage, Pending chemo/radiation    OT      SLP      SW Decreased caregiver support Wife can do  some min assist levle not mod   Team Discharge Planning: Destination: PT-Home ,OT- Home , SLP-Home Projected Follow-up: PT-Home health PT, OT-  Home health OT, Outpatient OT, 24 hour supervision/assistance (home health vs outpatient), SLP-Home Health SLP, 24 hour supervision/assistance, Outpatient SLP Projected Equipment Needs: PT-To be determined, Rolling walker with 5" wheels, OT-  Tub/shower seat, SLP-None recommended by SLP Equipment Details: PT- , OT-  Patient/family involved in discharge planning: PT- Patient, Family member/caregiver,  OT-Patient, Family member/caregiver, SLP-Patient, Family member/caregiver  MD ELOS: 10-14 days Medical Rehab Prognosis:  Excellent Assessment: The patient has been admitted for CIR therapies with the diagnosis of metastatic melanoma to brain. The team will be addressing functional mobility, strength, stamina, balance, safety, adaptive techniques and equipment, self-care, bowel and bladder mgt, patient and caregiver education, NMR, cognition, pain control, community reentry. Goals have been set at supervision with self-care, supervision to mod I with mobility and min to mod assist with cognition.   Due to the current state of emergency, patients may not be receiving their 3 hours per day of Medicare-mandated therapy.    Meredith Staggers, MD, FAAPMR     See Team Conference Notes for weekly updates to the plan of care

## 2020-09-13 NOTE — Progress Notes (Signed)
Occupational Therapy Session Note  Patient Details  Name: Gabriel [name] MRN: [id number] Date of Birth: 1930-05-03  Today's Date: 09/13/2020 OT Individual Time: 1002-1101 OT Individual Time Calculation (min): 59 min    Short Term Goals: Week 1:  OT Short Term Goal 1 (Week 1): Pt will complete bathing at shower level with CGA OT Short Term Goal 2 (Week 1): Pt will complete LB dressing (Including footwear) with CGA OT Short Term Goal 3 (Week 1): Pt will complete UB dressing with min assist OT Short Term Goal 4 (Week 1): Pt will complete 2 grooming tasks in standing at sink to demonstrate increased standing balance and tolerance  Skilled Therapeutic Interventions/Progress Updates:    Pt received in room and consented to OT tx. Pt transferred from recliner to w/c SPT no device with CGA.Marland Kitchen Pt wheeled down to gym for time mgmt, instructed in standing task with tacks and cork board to increase [facility] and standing tolerance for ADLs. Pt struggling to extend legs all the way during initial stand, reports he "has no strength in the legs" but tolerated second stand much better. Pt able to complete remainder of activity after first seated rest break. Tacks placed on L side of pt to increase L side attention skills. Pt able to remain standing for ~5 minutes before requesting rest breaks in wheelchair.   Pt instructed in BUE strengthening HEP to increase strength and activity tolerance for ADLs. Pt instructed in 3#db unilateral exercises including elbow flexion, chest press, shoulder press, and B shoulder flexion all for 3x15 with min cuing for proper technique with good carryover. After tx, pt helped back to room and left up in recliner with alarm on and all needs met.   Therapy Documentation Precautions:  Precautions Precautions: Fall Precaution Comments: crani Restrictions Weight Bearing Restrictions: No  Pain: none     Therapy/Group: Individual Therapy  Sonyia Muro 09/13/2020, 10:17 AM

## 2020-09-13 NOTE — Progress Notes (Signed)
Physical Therapy Session Note  Patient Details  Name: Gabriel [name] MRN: [id number] Date of Birth: 05/24/1930  Today's Date: 09/13/2020 PT Individual Time: 0901-0956 PT Individual Time Calculation (min): 55 min   Short Term Goals: Week 1:  PT Short Term Goal 1 (Week 1): Pt will ambulate 135ft with CGA PT Short Term Goal 2 (Week 1): Pt will perform bed mobility with supervision assist PT Short Term Goal 3 (Week 1): Pt will improve Berg balance score to >35 to indicate improved safety with functional mobility PT Short Term Goal 4 (Week 1): Pt will propell WC >124ft with supervision assist  Skilled Therapeutic Interventions/Progress Updates:   Received pt supine in bed with RN present singing to pt, pt agreeable to PT treatment, and denied any pain during session. Pt reported getting up with staff in the middle of the night to use restroom and feeling extremely "unstable" but did not fall. Session with emphasis on functional mobility/transfers, generalized strengthening, dynamic standing balance/coordination, gait training, and improved activity tolerance. Pt transferred supine<>sitting EOB with HOB elevated and use of bedrails with supervision. Doffed hospital socks and donned regular socks with supervision and increased time. Donned shoes with max A for time management purposes. Stand<>pivot bed<>WC without AD and min A and transported to 4W dayroom in [facility] total A for time management purposes. Pt ambulated 137ft x 1 and 17ft x 1 with RW and CGA with cues to increased LLE foot clearance. Lowered height of RW for improved positioning. Worked on dynamic standing balance playing cornhole using RUE and no AD with min A for balance x 2 trials. Pt able to pick up beanbag from floor without AD and min A for balance. Worked on blocked practice sit<>stands without UE support and light min A x 5 reps to fatigue. With increased fatigue, pt pressing elbows against thighs to assist in standing. Stand<>pivot mat<>WC  without AD and CGA with cues for hand placement when turning. Pt transported back to room in [facility] total A and ambulated 31ft with RW and CGA to recliner. Concluded session with pt sitting in recliner, needs within reach, and chair pad alarm on. Provided pt with fresh drink.   Therapy Documentation Precautions:  Precautions Precautions: Fall Precaution Comments: crani Restrictions Weight Bearing Restrictions: No  Therapy/Group: Individual Therapy Alfonse Alpers PT, DPT   09/13/2020, 7:36 AM

## 2020-09-13 NOTE — Progress Notes (Signed)
PROGRESS NOTE   Subjective/Complaints: Pt up in bed. Denies any pain. Denies headache. Says that therapy went well yesterday  ROS: Patient denies fever, rash, sore throat, blurred vision, nausea, vomiting, diarrhea, cough, shortness of breath or chest pain, joint or back pain, headache, or mood change.   Objective:   No results found. Recent Labs    09/11/20 0532  WBC 6.0  HGB 10.0*  HCT 31.8*  PLT 170   Recent Labs    09/11/20 0532  NA 135  K 4.3  CL 100  CO2 26  GLUCOSE 117*  BUN 30*  CREATININE 0.93  CALCIUM 8.0*    Intake/Output Summary (Last 24 hours) at 09/13/2020 1256 Last data filed at 09/13/2020 0900 Gross per 24 hour  Intake 440 ml  Output 400 ml  Net 40 ml        Physical Exam: Vital Signs Blood pressure 118/71, pulse 72, temperature 97.9 F (36.6 C), temperature source Oral, resp. rate 14, weight 83.9 kg, SpO2 99 %. Constitutional: No distress . Vital signs reviewed. HEENT: EOMI, oral membranes moist Neck: supple Cardiovascular: RRR without murmur. No JVD    Respiratory/Chest: CTA Bilaterally without wheezes or rales. Normal effort    GI/Abdomen: BS +, non-tender, non-distended Ext: no clubbing, cyanosis, or edema Psych: pleasant and cooperative Musculoskeletal:     Cervical back: Normal range of motion and neck supple.     Comments: RUE- 5/5 except finger abd 5-/5 LUE- 5/5 except finger abd 4+/5 RLE- HF 4+/5, KE 4/5, DF and PF 4-/5 LLE- HF 4-/5, KE 4-/5, DF 2-/5 and PF 2-/5 --no changes Skin:    General: Skin is warm and dry. Crani incision CDI with staples  Neurological:     Comments: alert. Follows basic commands. Fair insight and awareness. Can be tangential. Delayed processing L inattention noted on exam Intact to light touch in all 4 extremities-   Assessment/Plan: 1. Functional deficits which require 3+ hours per day of interdisciplinary therapy in a comprehensive inpatient  rehab setting. Physiatrist is providing close team supervision and 24 hour management of active medical problems listed below. Physiatrist and rehab team continue to assess barriers to discharge/monitor patient progress toward functional and medical goals  Care Tool:  Bathing    Body parts bathed by patient: Right arm, Left arm, Chest, Abdomen, Front perineal area, Right upper leg, Left upper leg, Face   Body parts bathed by helper: Buttocks, Right lower leg, Left lower leg     Bathing assist Assist Level: Moderate Assistance - Patient 50 - 74%     Upper Body Dressing/Undressing Upper body dressing   What is the patient wearing?: Button up shirt    Upper body assist Assist Level: Moderate Assistance - Patient 50 - 74%    Lower Body Dressing/Undressing Lower body dressing      What is the patient wearing?: Pants, Incontinence brief     Lower body assist Assist for lower body dressing: Contact Guard/Touching assist     Toileting Toileting    Toileting assist Assist for toileting: Minimal Assistance - Patient > 75%     Transfers Chair/bed transfer  Transfers assist     Chair/bed transfer  assist level: Contact Guard/Touching assist     Locomotion Ambulation   Ambulation assist      Assist level: Contact Guard/Touching assist Assistive device: Walker-rolling Max distance: 160ft   Walk 10 feet activity   Assist     Assist level: Contact Guard/Touching assist Assistive device: Walker-rolling   Walk 50 feet activity   Assist Walk 50 feet with 2 turns activity did not occur: Safety/medical concerns  Assist level: Contact Guard/Touching assist Assistive device: Walker-rolling    Walk 150 feet activity   Assist Walk 150 feet activity did not occur: Safety/medical concerns  Assist level: Contact Guard/Touching assist Assistive device: Walker-rolling    Walk 10 feet on uneven surface  activity   Assist Walk 10 feet on uneven surfaces  activity did not occur: Safety/medical concerns         Wheelchair     Assist        Wheelchair assist level: Minimal Assistance - Patient > 75%      Wheelchair 50 feet with 2 turns activity    Assist        Assist Level: Minimal Assistance - Patient > 75%   Wheelchair 150 feet activity     Assist      Assist Level: Minimal Assistance - Patient > 75%   Blood pressure 118/71, pulse 72, temperature 97.9 F (36.6 C), temperature source Oral, resp. rate 14, weight 83.9 kg, SpO2 99 %.  Medical Problem List and Plan: 1.  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  Status post stereotactic right temporal craniotomy resection of brain metastasis 09/03/2020.  Follow-up medical oncology as well as radiation oncology with radiation therapy as directed.  Decadron protocol             -patient may shower once staples out             -ELOS/Goals: 12-16 days supervision  -Continue CIR therapies including PT, OT, and SLP  2.  Antithrombotics: -DVT/anticoagulation: SCDs.             -antiplatelet therapy: N/A 3. Pain Management: Hydrocodone as needed- mainly taking tylenol per pt-   6/10- pt denies pain- continue mainly tylenol prn. Decrease Norco to q12H prn.  4. Mood: Ativan 0.25 mg daily as needed prior to radiation and scans             -antipsychotic agents: N/A 5. Neuropsych: This patient is capable of making decisions on his own behalf. 6. Skin/Wound Care: Routine skin checks 7. Fluids/Electrolytes/Nutrition: Routine in and outs with follow-up chemistries 8.  Seizure prophylaxis.  Keppra 500 mg twice daily 9.  Hypertension.  Lisinopril 10 mg daily, HCTZ 12.5 mg daily.  Monitor with increased mobility  6/10 BP well controlled- continue regimen 10.  Hyperlipidemia.  Lipitor 11.  Constipation.  Senokot twice daily, Colace twice daily, Dulcolax suppository daily as needed  6/10 finally moved bowels yesterday with sorbitol, continue current regimen   12. Hemorrhoids:  bleeding, painless, discussed hgb stable, repeat Monday        LOS: 3 days A FACE TO FACE EVALUATION WAS PERFORMED  Meredith Staggers 09/13/2020, 12:56 PM

## 2020-09-13 NOTE — Progress Notes (Signed)
Occupational Therapy Session Note  Patient Details  Name: Gabriel [name] MRN: [id number] Date of Birth: 1930-09-06  Today's Date: 09/13/2020 OT Individual Time: 1407-1505 OT Individual Time Calculation (min): 58 min    Short Term Goals: Week 1:  OT Short Term Goal 1 (Week 1): Pt will complete bathing at shower level with CGA OT Short Term Goal 2 (Week 1): Pt will complete LB dressing (Including footwear) with CGA OT Short Term Goal 3 (Week 1): Pt will complete UB dressing with min assist OT Short Term Goal 4 (Week 1): Pt will complete 2 grooming tasks in standing at sink to demonstrate increased standing balance and tolerance  Skilled Therapeutic Interventions/Progress Updates:    Treatment session with focus on self-care retraining, functional transfers, and activity tolerance/endurance.  Pt agreeable to shower.  Pt ambulated to room shower with RW with CGA and cues to attend to LLE as pt frequently leaving it behind.  Pt completed step over threshold into shower with min assist and min cues for sequencing.  Pt completed bathing at sit > stand level with CGA and min assist to wash buttocks for thoroughness.  Pt exited shower and reports need to toilet.  Pt completed toileting with CGA.  Pt ambulated back to recliner with RW and completed LB dressing with CGA for pants and setup for donning socks/shoes.  Pt required min assist and increased time to don button up shirt.  Therapist washed hair with wash cloth, ensuring to keep incision dry.  Pt then combed hair after setup with items.  Pt remained upright in recliner with chair alarm on and all needs in reach.  Therapy Documentation Precautions:  Precautions Precautions: Fall Precaution Comments: crani Restrictions Weight Bearing Restrictions: No General:   Vital Signs: Therapy Vitals Temp: 97.9 F (36.6 C) Temp Source: Oral Pulse Rate: 87 Resp: 18 BP: 93/61 Patient Position (if appropriate): Sitting Oxygen Therapy SpO2: 100 % O2  Device: Room Air Pain: Pain Assessment Pain Score: 0-No pain    Therapy/Group: Individual Therapy  Simonne Come 09/13/2020, 3:40 PM

## 2020-09-14 NOTE — Progress Notes (Signed)
PROGRESS NOTE   Subjective/Complaints:  Pt is on toilet- voiding- no BM. But denies constipation.  Doing "fine". No issues.   ROS:  Pt denies SOB, abd pain, CP, N/V/C/D, and vision changes   Objective:   No results found. No results for input(s): WBC, HGB, HCT, PLT in the last 72 hours.  No results for input(s): NA, K, CL, CO2, GLUCOSE, BUN, CREATININE, CALCIUM in the last 72 hours.   Intake/Output Summary (Last 24 hours) at 09/14/2020 1005 Last data filed at 09/14/2020 0700 Gross per 24 hour  Intake 460 ml  Output --  Net 460 ml        Physical Exam: Vital Signs Blood pressure 132/79, pulse 72, temperature (!) 97.4 F (36.3 C), temperature source Oral, resp. rate 20, weight 83.9 kg, SpO2 100 %.   General: awake, alert, appropriate, on toilet;  NAD HENT: conjugate gaze; oropharynx moist; crani on R side of head- staples intact CV: regular rate; no JVD Pulmonary: CTA B/L; no W/R/R- good air movement GI: soft, NT, ND, (+)BS; hypoactive Psychiatric: appropriate; joking some Neurological: alert Ext: no clubbing, cyanosis, or edema Musculoskeletal:     Cervical back: Normal range of motion and neck supple.     Comments: RUE- 5/5 except finger abd 5-/5 LUE- 5/5 except finger abd 4+/5 RLE- HF 4+/5, KE 4/5, DF and PF 4-/5 LLE- HF 4-/5, KE 4-/5, DF 2-/5 and PF 2-/5 --no changes Skin:    General: Skin is warm and dry. Crani incision CDI with staples  Neurological:     Comments: alert. Follows basic commands. Fair insight and awareness. Can be tangential. Delayed processing L inattention noted on exam Intact to light touch in all 4 extremities-   Assessment/Plan: 1. Functional deficits which require 3+ hours per day of interdisciplinary therapy in a comprehensive inpatient rehab setting. Physiatrist is providing close team supervision and 24 hour management of active medical problems listed below. Physiatrist and  rehab team continue to assess barriers to discharge/monitor patient progress toward functional and medical goals  Care Tool:  Bathing    Body parts bathed by patient: Right arm, Left arm, Chest, Abdomen, Front perineal area, Right upper leg, Left upper leg, Face   Body parts bathed by helper: Buttocks     Bathing assist Assist Level: Minimal Assistance - Patient > 75%     Upper Body Dressing/Undressing Upper body dressing   What is the patient wearing?: Button up shirt    Upper body assist Assist Level: Minimal Assistance - Patient > 75%    Lower Body Dressing/Undressing Lower body dressing      What is the patient wearing?: Pants, Incontinence brief     Lower body assist Assist for lower body dressing: Contact Guard/Touching assist     Toileting Toileting    Toileting assist Assist for toileting: Contact Guard/Touching assist     Transfers Chair/bed transfer  Transfers assist     Chair/bed transfer assist level: Contact Guard/Touching assist     Locomotion Ambulation   Ambulation assist      Assist level: Contact Guard/Touching assist Assistive device: Walker-rolling Max distance: 135ft   Walk 10 feet activity   Assist  Assist level: Contact Guard/Touching assist Assistive device: Walker-rolling   Walk 50 feet activity   Assist Walk 50 feet with 2 turns activity did not occur: Safety/medical concerns  Assist level: Contact Guard/Touching assist Assistive device: Walker-rolling    Walk 150 feet activity   Assist Walk 150 feet activity did not occur: Safety/medical concerns  Assist level: Contact Guard/Touching assist Assistive device: Walker-rolling    Walk 10 feet on uneven surface  activity   Assist Walk 10 feet on uneven surfaces activity did not occur: Safety/medical concerns         Wheelchair     Assist        Wheelchair assist level: Minimal Assistance - Patient > 75%      Wheelchair 50 feet with 2  turns activity    Assist        Assist Level: Minimal Assistance - Patient > 75%   Wheelchair 150 feet activity     Assist      Assist Level: Minimal Assistance - Patient > 75%   Blood pressure 132/79, pulse 72, temperature (!) 97.4 F (36.3 C), temperature source Oral, resp. rate 20, weight 83.9 kg, SpO2 100 %.  Medical Problem List and Plan: 1.  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  Status post stereotactic right temporal craniotomy resection of brain metastasis 09/03/2020.  Follow-up medical oncology as well as radiation oncology with radiation therapy as directed.  Decadron protocol             -patient may shower once staples out             -ELOS/Goals: 12-16 days supervision  -con't PT, OT and SLP 2.  Antithrombotics: -DVT/anticoagulation: SCDs.             -antiplatelet therapy: N/A 3. Pain Management: Hydrocodone as needed- mainly taking tylenol per pt-   6/10- pt denies pain- continue mainly tylenol prn. Decrease Norco to q12H prn.   6/11- denies pain- con't regimen as changed yesterday.  4. Mood: Ativan 0.25 mg daily as needed prior to radiation and scans             -antipsychotic agents: N/A 5. Neuropsych: This patient is capable of making decisions on his own behalf. 6. Skin/Wound Care: Routine skin checks 7. Fluids/Electrolytes/Nutrition: Routine in and outs with follow-up chemistries 8.  Seizure prophylaxis.  Keppra 500 mg twice daily 9.  Hypertension.  Lisinopril 10 mg daily, HCTZ 12.5 mg daily.  Monitor with increased mobility  6/11- BP controlled- con't regimen and monitor 10.  Hyperlipidemia.  Lipitor 11.  Constipation.  Senokot twice daily, Colace twice daily, Dulcolax suppository daily as needed  6/10 finally moved bowels yesterday with sorbitol, continue current regimen  6/11- LBM 2 days- if no BM by tomorrow- then will give Sorbitol again.   12. Hemorrhoids: bleeding, painless, discussed hgb stable, repeat Monday        LOS: 4 days A  FACE TO FACE EVALUATION WAS PERFORMED  Gabriel [name] 09/14/2020, 10:05 AM

## 2020-09-14 NOTE — Progress Notes (Signed)
Speech Language Pathology Daily Session Note  Patient Details  Name: Gabriel [name] MRN: [id number] Date of Birth: 10/09/1930  Today's Date: 09/14/2020 SLP Individual Time: 0746-0029 SLP Individual Time Calculation (min): 40 min  Short Term Goals: Week 1: SLP Short Term Goal 1 (Week 1): Pt will demonstrate sustained attention in 5-7 minute intervals with supervision A verbal cues, SLP Short Term Goal 2 (Week 1): Pt will demonstrate basic problem solving skills in familiar tasks with min A verbal cues. SLP Short Term Goal 3 (Week 1): Pt will demonstrate self-monitoring in basic problem solving tasks with max A verbal cues. SLP Short Term Goal 4 (Week 1): Pt will demonstrate recall of novel, functional information with mod A verbal cues for compensatory aids.  Skilled Therapeutic Interventions: Skilled treatment session focused on cognitive goals. SLP facilitated session with a mildly complex task in which patient had to identify errors within a medication pill box. Patient required Mod verbal cues for working memory, sustained attention, and problem solving throughout task. Patient was mildly distracted by initiating conversation with SLP and often repeated the same questions. Patient left upright in the wheelchair with alarm on and all needs within reach. Continue with current plan of care.      Pain No/Denies Pain   Therapy/Group: Individual Therapy  Alyna Stensland 09/14/2020, 12:23 PM

## 2020-09-14 NOTE — Progress Notes (Addendum)
Physical Therapy Session Note  Patient Details  Name: Gabriel [name] MRN: [id number] Date of Birth: 11/14/1930  Today's Date: 09/14/2020 PT Individual Time: 0805-0900; 4132-4401 PT Individual Time Calculation (min): 55 min , 29 min  Short Term Goals: Week 1:  PT Short Term Goal 1 (Week 1): Pt will ambulate 188ft with CGA PT Short Term Goal 2 (Week 1): Pt will perform bed mobility with supervision assist PT Short Term Goal 3 (Week 1): Pt will improve Berg balance score to >35 to indicate improved safety with functional mobility PT Short Term Goal 4 (Week 1): Pt will propell WC >174ft with supervision assist  Skilled Therapeutic Interventions/Progress Updates:     Tx 1:  Pt resting in bed.  He denied pain.    Therapeutic exercise performed with LEs/trunk to increase strength for functional mobility.-  PROM L hip and ankle.  Seated in wc: 10 x 1   neuromuscular re-education via multimodal cues, demo, forced use: in supine and hook lying: bil bridging, bil lower trunk rotation, bil ankle pumps. Seated with bil foot support : bil scapular retraction. Reciprocal scooting forward/backward; facilitation for forward wt shifting and = wt bearing L/R LEs via lifting hips into low squat using bil UEs on low surface in front of him> 0UEs. Balance training in standing on compliant Airex mat, with bil UE support> 0UE support with R/L wt shifting.  Pt noted to have scoliosis with R thoracic convexity/L lumbar concavity.  Pt unaware of this.    Gait training with RW x 180' with RW, CGA.  Pt initially demonstrated exaggerated steppage gait; with fatigue this lessened and L foot clearance decreased but remained functional without stubbing L foot.  At end of session, pt seated in wc with Loma Sousa, SLP entering for next session.  Tx 2:  Pt asleep in his wc, but awakened easily.  He denied pain.  neuromuscular re-education via demo, visual feedback and multimodal cues for seated: bil heel raises, bil  adductor squeezes, bil scapular retraction, R/L ankle DF with focus on eversion, forward wt shift to get COG over feet.   Gait training on level tile with RW, x 150' , CGA/supervision, with multiple turns and mod cues for upright trunk and forward gaze.  Pt stated that he has to concentrate to clear L foot.  Pt may benefit from L AFO for his mild L foot drop for fall prevention.   Pt exhausted.  Transferred to bed using RW, CGA. At end of session, pt resting in bed with needs at hand and alarm set.    Therapy Documentation Precautions:  Precautions Precautions: Fall Precaution Comments: crani Restrictions Weight Bearing Restrictions: No       Therapy/Group: Individual Therapy  Tawnya Pujol 09/14/2020, 10:17 AM

## 2020-09-15 NOTE — Progress Notes (Signed)
Occupational Therapy Session Note  Patient Details  Name: Gabriel [name] MRN: [id number] Date of Birth: [birth year]  Today's Date: 09/15/2020 OT Individual Time: 6546-5035 OT Individual Time Calculation (min): 41 min    Short Term Goals: Week 1:  OT Short Term Goal 1 (Week 1): Pt will complete bathing at shower level with CGA OT Short Term Goal 2 (Week 1): Pt will complete LB dressing (Including footwear) with CGA OT Short Term Goal 3 (Week 1): Pt will complete UB dressing with min assist OT Short Term Goal 4 (Week 1): Pt will complete 2 grooming tasks in standing at sink to demonstrate increased standing balance and tolerance  Skilled Therapeutic Interventions/Progress Updates:    Pt greeted in bed with no c/o pain. Reading [name] and unimpressed. He was agreeable to start session by completing oral care/shaving while standing and/or sitting in front of the sink. Significantly increased time for pt to motor plan supine<sit, needed vcs and Mod A overall to transition into sitting with HOB raised using the bedrail. 3 tries for sit<stand using RW, pt initially with posterior bias/LOBs back onto bed. He was then able to stand with CGA and ambulate into the bathroom with CGA-Min A. Pt at time of standing pt requested to use the restroom. When cued to lower clothing pt only lowered pants and then sat down on standard toilet (pt preferring to use vs elevating toilet using BSC). OT assisted with lowering pull up with pt using grab bar to lift into squatted position. He had continent BM, a bit incontinent in the pull up beforehand and Max A to remove LB clothing in order to remove pull up. He was able to complete perihygiene on his own while seated, CGA for donning new pull up + pants, assistance to don Lt footwear due to pt c/o foot feeling "frozen." Heavy reliance on grab bar + doorway for powering up into standing. When cued to wash his hands at the sink, pt reported "okay" and then ambulated  back to the bed using RW with CGA. Supervision to wash hands using sanitizer. Supervision for returning to bed after. Left him with all needs within reach and bed alarm set. Tx focus placed on functional ambulation, sit<stands, and ADL retraining.   Therapy Documentation Precautions:  Precautions Precautions: Fall Precaution Comments: crani Restrictions Weight Bearing Restrictions: No  ADL: ADL Eating: Independent Where Assessed-Eating: Edge of bed Upper Body Bathing: Contact guard Where Assessed-Upper Body Bathing: Edge of bed Lower Body Bathing: Minimal assistance Where Assessed-Lower Body Bathing: Edge of bed Upper Body Dressing: Maximal assistance Where Assessed-Upper Body Dressing: Edge of bed Lower Body Dressing: Minimal assistance Where Assessed-Lower Body Dressing: Edge of bed Toilet Transfer: Minimal assistance Toilet Transfer Method: Insurance claims handler Equipment: Bedside commode     Therapy/Group: Individual Therapy  [name] 09/15/2020, 12:39 PM

## 2020-09-15 NOTE — Progress Notes (Signed)
Speech Language Pathology Daily Session Note  Patient Details  Name: SALIL RAINERI MRN: [id number] Date of Birth: Aug 23, 1930  Today's Date: 09/15/2020 SLP Individual Time: 4098-1191 SLP Individual Time Calculation (min): 40 min  Short Term Goals: Week 1: SLP Short Term Goal 1 (Week 1): Pt will demonstrate sustained attention in 5-7 minute intervals with supervision A verbal cues, SLP Short Term Goal 2 (Week 1): Pt will demonstrate basic problem solving skills in familiar tasks with min A verbal cues. SLP Short Term Goal 3 (Week 1): Pt will demonstrate self-monitoring in basic problem solving tasks with max A verbal cues. SLP Short Term Goal 4 (Week 1): Pt will demonstrate recall of novel, functional information with mod A verbal cues for compensatory aids.  Skilled Therapeutic Interventions:  Pt was seen for skilled ST targeting cognitive goals.  Pt was seated upright in recliner upon therapist's arrival, awake, alert, and pleasantly interactive.  SLP facilitated the session with a novel card game to address recall and problem solving goals.  Pt was able to plan and execute a problem solving strategy within task with supervision faded to mod I cues for working memory of task rules and procedures.  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  Pt was left in recliner with call bell within reach.  Continue per current plan of care.    Pain Pain Assessment Pain Scale: 0-10 Pain Score: 0-No pain  Therapy/Group: Individual Therapy  Geneveive Furness, Selinda Orion 09/15/2020, 3:36 PM

## 2020-09-16 LAB — CBC
HCT: 32.9 % — ABNORMAL LOW (ref 39.0–52.0)
Hemoglobin: 10.5 g/dL — ABNORMAL LOW (ref 13.0–17.0)
MCH: 27.7 pg (ref 26.0–34.0)
MCHC: 31.9 g/dL (ref 30.0–36.0)
MCV: 86.8 fL (ref 80.0–100.0)
Platelets: 184 10*3/uL (ref 150–400)
RBC: 3.79 MIL/uL — ABNORMAL LOW (ref 4.22–5.81)
RDW: 18.2 % — ABNORMAL HIGH (ref 11.5–15.5)
WBC: 7.5 10*3/uL (ref 4.0–10.5)
nRBC: 0 % (ref 0.0–0.2)

## 2020-09-16 MED ORDER — HYDROCODONE-ACETAMINOPHEN 5-325 MG PO TABS
1.0000 | ORAL_TABLET | Freq: Every day | ORAL | Status: DC | PRN
Start: 2020-09-16 — End: 2020-09-17

## 2020-09-16 NOTE — Progress Notes (Signed)
Occupational Therapy Session Note  Patient Details  Name: Gabriel [name] MRN: [id number] Date of Birth: [birth year]  Today's Date: 09/16/2020 OT Individual Time: 8768-1157 OT Individual Time Calculation (min): 53 min    Short Term Goals: Week 1:  OT Short Term Goal 1 (Week 1): Pt will complete bathing at shower level with CGA OT Short Term Goal 2 (Week 1): Pt will complete LB dressing (Including footwear) with CGA OT Short Term Goal 3 (Week 1): Pt will complete UB dressing with min assist OT Short Term Goal 4 (Week 1): Pt will complete 2 grooming tasks in standing at sink to demonstrate increased standing balance and tolerance  Skilled Therapeutic Interventions/Progress Updates:    Treatment session with focus on functional mobility, activity tolerance, and endurance.  Pt received upright in recliner with wife present.  Pt reports feeling like legs were weaker today when attempting to ambulate to toilet with nurse tech.  Pt reports feel like legs were giving out when ambulating.  Discussed other symptoms with pt denying dizziness or lightheadness, just reporting that they had worked his legs hard this AM.  Pt agreeable to shower.  Pt ambulated towards bathroom with RW with min assist to stand and min-mod assist due to instability during ambulation.  Therapist had 2nd person bring recliner over to pt due to instability.  BP assessed 92/72 in sitting after ambulation attempt.  After standing 2 mins BP 80/61.  RN notified of instability, BLE weakness, and change in BP.  Therapist applied knee high TEDS and educated on purpose.  Therapist educated on need to increase water intake as pt may also be dehydrated as he reports not having much fluid intake and reporting feeling constipated. Therapist provided pt with visual reminder for increased fluid intake and placed in location that pt can visualize throughout the day. Wife present throughout session and reporting understanding of therapist and RN  recommendations.  Pt remained upright in recliner with legs elevated and all needs in reach.   Therapy Documentation Precautions:  Precautions Precautions: Fall Precaution Comments: crani Restrictions Weight Bearing Restrictions: No General:   Vital Signs: Therapy Vitals Temp: (!) 97.5 F (36.4 C) Temp Source: Oral Pulse Rate: 82 Resp: 19 BP: 100/69 Patient Position (if appropriate): Sitting Oxygen Therapy SpO2: 100 % O2 Device: Room Air Pain:  Pt with no c/o pain   Therapy/Group: Individual Therapy  Simonne Come 09/16/2020, 3:25 PM

## 2020-09-16 NOTE — Progress Notes (Signed)
Speech Language Pathology Daily Session Note  Patient Details  Name: Gabriel [name] MRN: [id number] Date of Birth: 02/02/1931  Today's Date: 09/16/2020 SLP Individual Time: 1497-0263 SLP Individual Time Calculation (min): 45 min  Short Term Goals: Week 1: SLP Short Term Goal 1 (Week 1): Pt will demonstrate sustained attention in 5-7 minute intervals with supervision A verbal cues, SLP Short Term Goal 2 (Week 1): Pt will demonstrate basic problem solving skills in familiar tasks with min A verbal cues. SLP Short Term Goal 3 (Week 1): Pt will demonstrate self-monitoring in basic problem solving tasks with max A verbal cues. SLP Short Term Goal 4 (Week 1): Pt will demonstrate recall of novel, functional information with mod A verbal cues for compensatory aids.  Skilled Therapeutic Interventions: Skilled SLP intervention focused on cognition. Pt completed paragraph retention task with 8-10 sentence paragraph with mod A for recall of information. He required choice of 3 to increase accuracy. He completed basic problem solving task with reading a calendar to determine conflicts within a schedule with mod A verbal cues and increased time for processing. Pt ambulated to toilet with min A. He recalled steps for transfer with Supervision A. Cont with therapy per plan of care.      Pain Pain Assessment Pain Scale: Faces Pain Score: 0-No pain Faces Pain Scale: No hurt  Therapy/Group: Individual Therapy  Darrol Poke Kjirsten Bloodgood 09/16/2020, 10:21 AM

## 2020-09-16 NOTE — Progress Notes (Signed)
Occupational Therapy Session Note  Patient Details  Name: VERDUN RACKLEY MRN: [id number] Date of Birth: [birth year]  Today's Date: 09/16/2020 OT Individual Time: 1035-1100 OT Individual Time Calculation (min): 25 min    Short Term Goals: Week 1:  OT Short Term Goal 1 (Week 1): Pt will complete bathing at shower level with CGA OT Short Term Goal 2 (Week 1): Pt will complete LB dressing (Including footwear) with CGA OT Short Term Goal 3 (Week 1): Pt will complete UB dressing with min assist OT Short Term Goal 4 (Week 1): Pt will complete 2 grooming tasks in standing at sink to demonstrate increased standing balance and tolerance  Skilled Therapeutic Interventions/Progress Updates:    Patient seated in recliner, pleasant and cooperative.  He denies pain and states that he would like to work on leg strength and balance.  Sit to stand with CGA - completed standing stepping, trunk rotation, squats, reaching and weight shift activities - brief seated rest break between exercises.  He returned to recliner at close of session.  Seat alarm set and callbell/tray table in reach.    Therapy Documentation Precautions:  Precautions Precautions: Fall Precaution Comments: crani Restrictions Weight Bearing Restrictions: No   Therapy/Group: Individual Therapy  Carlos Levering 09/16/2020, 7:43 AM

## 2020-09-16 NOTE — Progress Notes (Signed)
PROGRESS NOTE   Subjective/Complaints: Walked 240feet CG today! Reading a book now Staples may be d/ced tomorrow- order placed Hgb 10.5, improving  ROS:  Pt denies SOB, abd pain, CP, N/V/C/D, and vision changes, pain   Objective:   No results found. Recent Labs    09/16/20 0556  WBC 7.5  HGB 10.5*  HCT 32.9*  PLT 184    No results for input(s): NA, K, CL, CO2, GLUCOSE, BUN, CREATININE, CALCIUM in the last 72 hours.   Intake/Output Summary (Last 24 hours) at 09/16/2020 1559 Last data filed at 09/16/2020 1300 Gross per 24 hour  Intake 580 ml  Output --  Net 580 ml        Physical Exam: Vital Signs Blood pressure 100/69, pulse 82, temperature (!) 97.5 F (36.4 C), temperature source Oral, resp. rate 19, weight 83.9 kg, SpO2 100 %. Gen: no distress, normal appearing HEENT: oral mucosa pink and moist, NCAT Cardio: Reg rate Chest: normal effort, normal rate of breathing Abd: soft, non-distended  Ext: no clubbing, cyanosis, or edema Musculoskeletal:     Cervical back: Normal range of motion and neck supple.     Comments: RUE- 5/5 except finger abd 5-/5 LUE- 5/5 except finger abd 4+/5 RLE- HF 4+/5, KE 4/5, DF and PF 4-/5 LLE- HF 4-/5, KE 4-/5, DF 2-/5 and PF 2-/5 --no changes Skin:    General: Skin is warm and dry. Crani incision CDI with staples  Neurological:     Comments: alert. Follows basic commands. Fair insight and awareness. Can be tangential. Delayed processing L inattention noted on exam Intact to light touch in all 4 extremities-   Assessment/Plan: 1. Functional deficits which require 3+ hours per day of interdisciplinary therapy in a comprehensive inpatient rehab setting. Physiatrist is providing close team supervision and 24 hour management of active medical problems listed below. Physiatrist and rehab team continue to assess barriers to discharge/monitor patient progress toward functional  and medical goals  Care Tool:  Bathing    Body parts bathed by patient: Right arm, Left arm, Chest, Abdomen, Front perineal area, Right upper leg, Left upper leg, Face   Body parts bathed by helper: Buttocks     Bathing assist Assist Level: Minimal Assistance - Patient > 75%     Upper Body Dressing/Undressing Upper body dressing   What is the patient wearing?: Button up shirt    Upper body assist Assist Level: Minimal Assistance - Patient > 75%    Lower Body Dressing/Undressing Lower body dressing      What is the patient wearing?: Pants, Underwear/pull up     Lower body assist Assist for lower body dressing: Contact Guard/Touching assist     Toileting Toileting    Toileting assist Assist for toileting: Contact Guard/Touching assist     Transfers Chair/bed transfer  Transfers assist     Chair/bed transfer assist level: Supervision/Verbal cueing     Locomotion Ambulation   Ambulation assist      Assist level: Contact Guard/Touching assist Assistive device: Walker-rolling Max distance: 259ft   Walk 10 feet activity   Assist     Assist level: Contact Guard/Touching assist Assistive device: Walker-rolling   Walk  50 feet activity   Assist Walk 50 feet with 2 turns activity did not occur: Safety/medical concerns  Assist level: Contact Guard/Touching assist Assistive device: Walker-rolling    Walk 150 feet activity   Assist Walk 150 feet activity did not occur: Safety/medical concerns  Assist level: Contact Guard/Touching assist Assistive device: Walker-rolling    Walk 10 feet on uneven surface  activity   Assist Walk 10 feet on uneven surfaces activity did not occur: Safety/medical concerns         Wheelchair     Assist        Wheelchair assist level: Minimal Assistance - Patient > 75%      Wheelchair 50 feet with 2 turns activity    Assist        Assist Level: Minimal Assistance - Patient > 75%    Wheelchair 150 feet activity     Assist      Assist Level: Minimal Assistance - Patient > 75%   Blood pressure 100/69, pulse 82, temperature (!) 97.5 F (36.4 C), temperature source Oral, resp. rate 19, weight 83.9 kg, SpO2 100 %.  Medical Problem List and Plan: 1.  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  Status post stereotactic right temporal craniotomy resection of brain metastasis 09/03/2020.  Follow-up medical oncology as well as radiation oncology with radiation therapy as directed.  Decadron protocol             -patient may shower once staples out             -ELOS/Goals: 12-16 days supervision  -Continue PT, OT and SLP 2.  Antithrombotics: -DVT/anticoagulation: SCDs.             -antiplatelet therapy: N/A 3. Pain Management:  Decrease Norco to daily PRN 4. Situational anxiety: Continue Ativan 0.25 mg daily as needed prior to radiation and scans             -antipsychotic agents: N/A 5. Neuropsych: This patient is capable of making decisions on his own behalf. 6. Skin/Wound Care: Routine skin checks 7. Fluids/Electrolytes/Nutrition: Routine in and outs with follow-up chemistries 8.  Seizure prophylaxis.  d/c Keppra, completed course 9.  Hypertension.  Lisinopril 10 mg daily, HCTZ 12.5 mg daily.  Monitor with increased mobility  6/11- BP controlled- con't regimen and monitor 10.  Hyperlipidemia.  Lipitor 11.  Constipation.  Senokot twice daily, Colace twice daily, Dulcolax suppository daily as needed  6/10 finally moved bowels yesterday with sorbitol, continue current regimen  6/11- LBM 2 days- if no BM by tomorrow- then will give Sorbitol again.   12. Hemorrhoids: bleeding, painless, discussed hgb stable, repeat Monday        LOS: 6 days A FACE TO FACE EVALUATION WAS PERFORMED  Martha Clan P Yonatan Guitron 09/16/2020, 3:59 PM

## 2020-09-16 NOTE — Progress Notes (Signed)
                                                                                                                                                             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   Plan: The patient will receive a call in about one month from the radiation oncology department. He will continue follow up with Dr. Julien Nordmann as well. He will continue to follow up with neurosurgery and be followed by Dr. Mickeal Skinner in brain oncology program. ________________________________________________    Carola Rhine, PAC

## 2020-09-16 NOTE — Progress Notes (Signed)
Physical Therapy Session Note  Patient Details  Name: Gabriel [name] MRN: [id number] Date of Birth: Jun 24, 1930  Today's Date: 09/16/2020 PT Individual Time: 2585-2778 PT Individual Time Calculation (min): 54 min   Short Term Goals: Week 1:  PT Short Term Goal 1 (Week 1): Pt will ambulate 131ft with CGA PT Short Term Goal 2 (Week 1): Pt will perform bed mobility with supervision assist PT Short Term Goal 3 (Week 1): Pt will improve Berg balance score to >35 to indicate improved safety with functional mobility PT Short Term Goal 4 (Week 1): Pt will propell WC >155ft with supervision assist  Skilled Therapeutic Interventions/Progress Updates:   Received pt sitting in recliner, pt agreeable to PT treatment, and denied any pain during session. Session with emphasis on functional mobility/transfers, generalized strengthening, dynamic standing balance/coordination, gait training, stair navigation, and improved activity tolerance. Donned shoes with max A and pt ambulated to sink with RW and close supervision. Pt stood at sink and brushed teeth with supervision with 1 posterior LOB when backing up and turning requiring heavy CGA. Pt transported to 4W therapy gym in [facility] total A for time management purposes. Pt navigated 12 steps with 2 handrails and CGA ascending with a step through and descending with a step to pattern for first 8 steps and then ascending and descending with a step to pattern for last 4 steps due to increased fatigue. Pt then ambulated 247ft with RW and CGA/supervision. Pt demonstrates decreased cadence, narrow BOS, decreased foot clearance L>R with difficulty with L toe clearance requiring cues to increase LLE step height. Pt required multiple rest breaks throughout session due to fatigue. Discussed home setup and furniture and pt reported spending most of his time in his low sitting recliner. Pt ambulated additional 61ft with RW and CGA to rehab apartment and practiced furniture transfer  onto low sitting recliner with CGA for balance due to mild unsteadiness. Pt transported back to room in [facility] total A and ambulated 62ft with RW and CGA to recliner. Concluded session with pt sitting in recliner, needs within reach, and chair pad alarm on.  Therapy Documentation Precautions:  Precautions Precautions: Fall Precaution Comments: crani Restrictions Weight Bearing Restrictions: No  Therapy/Group: Individual Therapy Alfonse Alpers PT, DPT   09/16/2020, 7:15 AM

## 2020-09-17 LAB — URINALYSIS, ROUTINE W REFLEX MICROSCOPIC
Bilirubin Urine: NEGATIVE
Glucose, UA: NEGATIVE mg/dL
Hgb urine dipstick: NEGATIVE
Ketones, ur: NEGATIVE mg/dL
Leukocytes,Ua: NEGATIVE
Nitrite: NEGATIVE
Protein, ur: NEGATIVE mg/dL
Specific Gravity, Urine: 1.018 (ref 1.005–1.030)
pH: 5 (ref 5.0–8.0)

## 2020-09-17 MED ORDER — LISINOPRIL 5 MG PO TABS
5.0000 mg | ORAL_TABLET | Freq: Every day | ORAL | Status: DC
  Administered 2020-09-18 – 2020-09-20 (×3): 5 mg via ORAL
  Filled 2020-09-17 (×3): qty 1

## 2020-09-17 NOTE — Patient Care Conference (Addendum)
Inpatient RehabilitationTeam Conference and Plan of Care Update Date: 09/17/2020   Time: 13:43 PM    Patient Name: Gabriel [name]      Medical Record Number: [id number]  Date of Birth: [birth year] Sex: Male         Room/Bed: 7P10C/5E52D-78 Payor Info: Payor: [employer] MEDICARE / Plan: [facility] MEDICARE / Product Type: *No Product type* /    Admit Date/Time:  09/10/2020  6:54 PM  Primary Diagnosis:  Metastatic melanoma [facility])  Hospital Problems: Principal Problem:   Metastatic melanoma [facility])    Expected Discharge Date: Expected Discharge Date: 09/25/20  Team Members Present: Physician leading conference: Dr. Leeroy Cha Care Coodinator Present: Ovidio Kin, LCSW;Malaina Mortellaro Hervey Ard, RN, BSN, CRRN Nurse Present: Dorien Chihuahua, RN PT Present: Becky Sax, PT OT Present: Simonne Come, OT PPS Coordinator present : Ileana Ladd, PT     Current Status/Progress Goal Weekly Team Focus  Bowel/Bladder   Pt is currently cont of B/B. LBM 09/16/20.  Pt remains cont of B/B  Toilet pt q2h and PRN   Swallow/Nutrition/ Hydration             ADL's   Min A ambulatory transfers with RW, CGA - Mod A sit> stand, Min A bathing and UB/LB dressing.  Pt with increased BLE weakness 6/13 and low BP  Supervision  ADL retraining, sit > stand, dynamic standing balance, activity tolerance/endurance   Mobility   bed mobility supervision, transfers with RW CGA, gait >149ft with RW CGA, 8 steps 2 rails min A  supervision  functional mobility/transfers, generalized strengthening, dynamic sitting/standing balance/coordination, gait, and improved endurance to activity.   Communication             Safety/Cognition/ Behavioral Observations  mod A memory mod A problem solving  supervision-min A  mildlycomplex problem solving with familiar tasks, sustained attention, recall of information.   Pain   Pt complains of no pain  Pt remains pain free  Assess for pain qshift   Skin   Surgical incision to R- side of  Head with staples; clean and intact  Pt skin remains clean and intact free of s/s of infection  Assess skin qshift and provide skin care     Discharge Planning:  Home with wife who can provide assist, but is somewhat nervous wanting to make sure ready to go home when discharged   Team Discussion: Denies pain, staples oout of incision today. Hemorrhoids stable. Staff noted low BP and "foggy" behavior. Slower initiation, delayed response and exhaustion. MD adjust BP meds and check UA C+S.  Patient on target to meet rehab goals: yes, currently CGA ambulating 150' and min assist with a walker. Requires min assist for dressing. Requires mod assist for problem solving, error awareness with supervision goals for SLP  *See Care Plan and progress notes for long and short-term goals.   Revisions to Treatment Plan:  Working on error awareness , deficit awareness, memeory and problem solving  Teaching Needs: Transfers, safety , toileting, medication management, etc  Current Barriers to Discharge: Decreased caregiver support and Home enviroment access/layout  Possible Resolutions to Barriers: Family education     Medical Summary Current Status: Hypotensive with therapy, s/p craniotomy, increased slowing  Barriers to Discharge: Medical stability  Barriers to Discharge Comments: Hypotensive with therapy, s/p craniotomy, increased slowing Possible Resolutions to [employer] Focus: decrease lisinopril to 5mg , may remove staples today, UA/UC ordered   Continued Need for Acute Rehabilitation Level of Care: The patient requires daily medical management by  a physician with specialized training in physical medicine and rehabilitation for the following reasons: Direction of a multidisciplinary physical rehabilitation program to maximize functional independence : Yes Medical management of patient stability for increased activity during participation in an intensive rehabilitation regime.: Yes Analysis  of laboratory values and/or radiology reports with any subsequent need for medication adjustment and/or medical intervention. : Yes   I attest that I was present, lead the team conference, and concur with the assessment and plan of the team.   Dorien Chihuahua B 09/17/2020, 2:14 PM

## 2020-09-17 NOTE — Progress Notes (Signed)
Speech Language Pathology Weekly Progress and Session Note  Patient Details  Name: Gabriel [name] MRN: [id number] Date of Birth: 12/05/1930  Beginning of progress report period: September 10, 2020 End of progress report period: September 17, 2020  Today's Date: 09/17/2020 SLP Individual Time: 6067-7034 SLP Individual Time Calculation (min): 30 min  Short Term Goals: Week 1: SLP Short Term Goal 1 (Week 1): Pt will demonstrate sustained attention in 5-7 minute intervals with supervision A verbal cues, SLP Short Term Goal 1 - Progress (Week 1): Met SLP Short Term Goal 2 (Week 1): Pt will demonstrate basic problem solving skills in familiar tasks with min A verbal cues. SLP Short Term Goal 2 - Progress (Week 1): Met SLP Short Term Goal 3 (Week 1): Pt will demonstrate self-monitoring in basic problem solving tasks with max A verbal cues. SLP Short Term Goal 3 - Progress (Week 1): Met SLP Short Term Goal 4 (Week 1): Pt will demonstrate recall of novel, functional information with mod A verbal cues for compensatory aids. SLP Short Term Goal 4 - Progress (Week 1): Progressing toward goal    New Short Term Goals: Week 2: SLP Short Term Goal 1 (Week 2): Pt will demonstrate alternating attention in 5-7 minute intervals with supervision A verbal cues, SLP Short Term Goal 2 (Week 2): Pt will demonstrate mildyl complex problem solving skills in familiar tasks with mod A verbal cues. SLP Short Term Goal 3 (Week 2): Pt will demonstrate self-monitoring in basic problem solving tasks with min A verbal cues. SLP Short Term Goal 4 (Week 2): Pt will demonstrate recall of novel, functional information with mod A verbal cues for compensatory aids.  Weekly Progress Updates: Pt has met 3 out of 4 goals this reporting period due to improvement in attention, problem solving and self monitoring when completing tasks. He continues to require mod A with verbal cues for recall of novel information. Pt and family education  is  ongoing and he will benefit from continued skilled SLP intervention to increase cognitive linguistic skills and maximize functional independence.      Intensity: Minumum of 1-2 x/day, 30 to 90 minutes Frequency: 3 to 5 out of 7 days Duration/Length of Stay: 12-16 days Treatment/Interventions: Cognitive remediation/compensation;Cueing hierarchy;Functional tasks;Internal/external aids;Patient/family education   Daily Session  Skilled Therapeutic Interventions: Skilled SLP intervention focused on cognition. Alternating attention card sorting task completed with increased time and mod A verbal and visual cues. He sustained attention adequately throughout task and in conversation. Pt able to read schedule and answer basic problem solving questions related to times an therapy with min a verbal cues. Pt left seated upright in bed with bed alarm set and call button within reach. Cont with therapy per plan of care.     General    Pain Pain Assessment Pain Scale: Faces Faces Pain Scale: No hurt  Therapy/Group: Individual Therapy  Darrol Poke Hartley Wyke 09/17/2020, 8:50 AM

## 2020-09-17 NOTE — Progress Notes (Signed)
Physical Therapy Session Note  Patient Details  Name: Gabriel [name] MRN: [id number] Date of Birth: 12-01-1930  Today's Date: 09/17/2020 PT Individual Time: 9935-7017 PT Individual Time Calculation (min): 18 min   Today's Date: 09/17/2020 PT Missed Time: 12 Minutes Missed Time Reason: Nursing care (staple removal)  Short Term Goals: Week 1:  PT Short Term Goal 1 (Week 1): Pt will ambulate 184ft with CGA PT Short Term Goal 2 (Week 1): Pt will perform bed mobility with supervision assist PT Short Term Goal 3 (Week 1): Pt will improve Berg balance score to >35 to indicate improved safety with functional mobility PT Short Term Goal 4 (Week 1): Pt will propell WC >179ft with supervision assist  Skilled Therapeutic Interventions/Progress Updates:   Received pt sitting in [facility] with RN present removing staples. Pt agreeable to PT treatment and denied any pain during session. Session with emphasis on functional mobility/transfers, generalized strengthening, dynamic standing balance/coordination, ambulation, and improved activity tolerance. Pt's wife requested to order pt's next few meals and comb pt's hair prior to pt beginning therapy. Sit<>stand with RW and CGA and ambulated 63ft x 1 and 27ft x 1 with RW and CGA with increased time. Pt demonstrated narrow BOS, flexed trunk, increased steppage gait pattern, and decreased trunk rotation. Concluded session with pt sitting in WC, needs within reach, and seatbelt alarm on. 12 minutes missed of skilled physical therapy due to nursing care.   Therapy Documentation Precautions:  Precautions Precautions: Fall Precaution Comments: crani Restrictions Weight Bearing Restrictions: No   Therapy/Group: Individual Therapy Alfonse Alpers PT, DPT   09/17/2020, 7:29 AM

## 2020-09-17 NOTE — Progress Notes (Signed)
Patient ID: Gabriel [name], male   DOB: 09-03-1930, 87 y.o.   MRN: [id number] Met with pt to discuss team conference goals of supervision level and target discharge date of 6/22. Have left a message for wife and will await her return call. Pt is pleased with this plan and glad to have a discharge date.

## 2020-09-17 NOTE — Progress Notes (Signed)
PROGRESS NOTE   Subjective/Complaints: No complaints Team conference today Denies pain- discussed daily weaning of pain medication He is displeased he takes 5 pills every morning  ROS:  Pt denies SOB, abd pain, CP, N/V/C/D, and vision changes, pain   Objective:   No results found. Recent Labs    09/16/20 0556  WBC 7.5  HGB 10.5*  HCT 32.9*  PLT 184    No results for input(s): NA, K, CL, CO2, GLUCOSE, BUN, CREATININE, CALCIUM in the last 72 hours.   Intake/Output Summary (Last 24 hours) at 09/17/2020 0913 Last data filed at 09/16/2020 1755 Gross per 24 hour  Intake 320 ml  Output --  Net 320 ml        Physical Exam: Vital Signs Blood pressure 121/79, pulse 89, temperature (!) 97.5 F (36.4 C), temperature source Oral, resp. rate 18, weight 83.9 kg, SpO2 100 %. Gen: no distress, normal appearing HEENT: oral mucosa pink and moist, NCAT Cardio: Reg rate Chest: normal effort, normal rate of breathing Abd: soft, non-distended   Ext: no clubbing, cyanosis, or edema Musculoskeletal:     Cervical back: Normal range of motion and neck supple.     Comments: RUE- 5/5 except finger abd 5-/5 LUE- 5/5 except finger abd 4+/5 RLE- HF 4+/5, KE 4/5, DF and PF 4-/5 LLE- HF 4-/5, KE 4-/5, DF 2-/5 and PF 2-/5 --no changes Skin:    General: Skin is warm and dry. Crani incision CDI with staples  Neurological:     Comments: alert. Follows basic commands. Fair insight and awareness. Can be tangential. Delayed processing L inattention noted on exam Intact to light touch in all 4 extremities-   Assessment/Plan: 1. Functional deficits which require 3+ hours per day of interdisciplinary therapy in a comprehensive inpatient rehab setting. Physiatrist is providing close team supervision and 24 hour management of active medical problems listed below. Physiatrist and rehab team continue to assess barriers to discharge/monitor  patient progress toward functional and medical goals  Care Tool:  Bathing    Body parts bathed by patient: Right arm, Left arm, Chest, Abdomen, Front perineal area, Right upper leg, Left upper leg, Face   Body parts bathed by helper: Buttocks     Bathing assist Assist Level: Minimal Assistance - Patient > 75%     Upper Body Dressing/Undressing Upper body dressing   What is the patient wearing?: Button up shirt    Upper body assist Assist Level: Minimal Assistance - Patient > 75%    Lower Body Dressing/Undressing Lower body dressing      What is the patient wearing?: Pants, Underwear/pull up     Lower body assist Assist for lower body dressing: Contact Guard/Touching assist     Toileting Toileting    Toileting assist Assist for toileting: Contact Guard/Touching assist     Transfers Chair/bed transfer  Transfers assist     Chair/bed transfer assist level: Supervision/Verbal cueing     Locomotion Ambulation   Ambulation assist      Assist level: Contact Guard/Touching assist Assistive device: Walker-rolling Max distance: 214ft   Walk 10 feet activity   Assist     Assist level: Contact Guard/Touching assist Assistive  device: Walker-rolling   Walk 50 feet activity   Assist Walk 50 feet with 2 turns activity did not occur: Safety/medical concerns  Assist level: Contact Guard/Touching assist Assistive device: Walker-rolling    Walk 150 feet activity   Assist Walk 150 feet activity did not occur: Safety/medical concerns  Assist level: Contact Guard/Touching assist Assistive device: Walker-rolling    Walk 10 feet on uneven surface  activity   Assist Walk 10 feet on uneven surfaces activity did not occur: Safety/medical concerns         Wheelchair     Assist        Wheelchair assist level: Minimal Assistance - Patient > 75%      Wheelchair 50 feet with 2 turns activity    Assist        Assist Level: Minimal  Assistance - Patient > 75%   Wheelchair 150 feet activity     Assist      Assist Level: Minimal Assistance - Patient > 75%   Blood pressure 121/79, pulse 89, temperature (!) 97.5 F (36.4 C), temperature source Oral, resp. rate 18, weight 83.9 kg, SpO2 100 %.  Medical Problem List and Plan: 1.  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  Status post stereotactic right temporal craniotomy resection of brain metastasis 09/03/2020.  Follow-up medical oncology as well as radiation oncology with radiation therapy as directed.  Decadron protocol             -patient may shower once staples out             -ELOS/Goals: 12-16 days supervision  -Continue PT, OT and SLP  -Interdisciplinary Team Conference today   2.  Antithrombotics: -DVT/anticoagulation: SCDs.             -antiplatelet therapy: N/A 3. Pain Management:  N/A. Discontinue Norco  4. Situational anxiety: Continue Ativan 0.25 mg daily as needed prior to radiation and scans             -antipsychotic agents: N/A 5. Neuropsych: This patient is capable of making decisions on his own behalf. 6. Skin/Wound Care: Routine skin checks. Staples d/c on 6/14- nursing order placed.  7. Fluids/Electrolytes/Nutrition: Routine in and outs with follow-up chemistries 8.  Seizure prophylaxis.  d/c Keppra, completed course 9.  Hypertension.  Continue Lisinopril 10 mg daily, HCTZ 12.5 mg daily.  Monitor with increased mobility 10.  Hyperlipidemia.  Lipitor 11.  Constipation.  Senokot twice daily, Colace twice daily, Dulcolax suppository daily as needed  6/10 finally moved bowels yesterday with sorbitol, continue current regimen  6/11- LBM 2 days- if no BM by tomorrow- then will give Sorbitol again.   12. Hemorrhoids: bleeding, painless, discussed hgb stable, repeat Monday 13. Disposition: HFU scheduled with me on 11/22/20 at 2pm.         LOS: 7 days A FACE TO FACE EVALUATION WAS PERFORMED  Clide Deutscher Maricela Schreur 09/17/2020, 9:13 AM

## 2020-09-17 NOTE — Progress Notes (Signed)
Physical Therapy Session Note  Patient Details  Name: Gabriel [name] MRN: [id number] Date of Birth: 05-Jul-1930  Today's Date: 09/17/2020 PT Individual Time: 0240-9735 and 1445-1530 PT Individual Time Calculation (min): 75 min  and 45 min  Short Term Goals: Week 1:  PT Short Term Goal 1 (Week 1): Pt will ambulate 126ft with CGA PT Short Term Goal 2 (Week 1): Pt will perform bed mobility with supervision assist PT Short Term Goal 3 (Week 1): Pt will improve Berg balance score to >35 to indicate improved safety with functional mobility PT Short Term Goal 4 (Week 1): Pt will propell WC >128ft with supervision assist  Skilled Therapeutic Interventions/Progress Updates:   AM SESSION Pain:  Pt reports no pain.  Treatment to tolerance.  Rest breaks and repositioning as needed.  Pt initially seated oob in wc  and agreeable to treatment session w/focus on functional mobility, balance, endurance.  Pt engages in apprpriate conversation during session and discusses his past as well as enquires about this therapist.  Very pleasant individual.  Pt transported to rehab gym. Sit to stand from wc w/min assist.   Gait 4ft w/RW, cga, cues for upright posture/gaze  Standing balance: Standing w/single Ue support on walker, alternating arm raise watching hand raise for additional challenge, cga Standing marching in place - min assist esp when wbing on LLE Standing forward/reverse stepping x 5 each w/cga Standing reaching to R w/RUE and grasping horshoes at hip level then reaches overhead to place onto high baskeball rim.  Single UE support on walker, cga. Standing crossbody reaching w/LUE to repeat above, requires 2 seated rest breaks to complete 9 horseshoes w/LUE. Gait 51ft w/RW, cga, cues for upright posture/gaze, crouched posture. Pt provided w/18x20 3in foam contoured cushion to increase seat to floor height of wc and seating comfort due to height.  Improved posture and comfort achieved w/addition of  cushion. Gait 65ft at above. Pt left oob in wc w/alarm belt set and needs in reach      PM SESSION Pain:  Pt reports no pain.  Treatment to tolerance.  Rest breaks and repositioning as needed.  Pt initially oob in wc and agreeable to treatment session .  Nursing encouraging fluids, states she needs urine sample but pt unable at this time.  Agreed to continue to encourage liquids during session.  Pt transported to gym for time management. Pt remains appropriate but less conversive this pm, appears a bit tired.    Gait 2ft w/cga, crouched gait, downward gaze, fatigues quickly this pm required second person to retrieve wc for rest break. Standing holding 1lb bar w/bilat Ues performed overhead raise x 10 initially w/cga progressing to min assist due to post tendency. Gait 24ft w/min to cga, RW, deviations as above. Sit to stand x 6 w/cga  Pt requested return to room - felt he could produce requested urine sample.  Pt transported to room.   Sit to stand and gait x 56ft to commode, commode transfer w/cues for safety, min assist, uses grab bar and doorway to control descent, refused use of BSC to raise seat height.  Nursing states he declined this in am as well and was able to transfer from low commode.   Pt handed off to NT for collection of urine sample, session timed out.  Therapy Documentation Precautions:  Precautions Precautions: Fall Precaution Comments: crani Restrictions Weight Bearing Restrictions: No    Therapy/Group: Individual Therapy Callie Fielding, [place] 09/17/2020, 12:29 PM

## 2020-09-17 NOTE — Progress Notes (Signed)
Occupational Therapy Session Note  Patient Details  Name: Gabriel [name] MRN: [id number] Date of Birth: 1930-07-27  Today's Date: 09/17/2020 OT Individual Time: 9774-1423 OT Individual Time Calculation (min): 47 min    Short Term Goals: Week 1:  OT Short Term Goal 1 (Week 1): Pt will complete bathing at shower level with CGA OT Short Term Goal 2 (Week 1): Pt will complete LB dressing (Including footwear) with CGA OT Short Term Goal 3 (Week 1): Pt will complete UB dressing with min assist OT Short Term Goal 4 (Week 1): Pt will complete 2 grooming tasks in standing at sink to demonstrate increased standing balance and tolerance  Skilled Therapeutic Interventions/Progress Updates:   Met pt lying supine slightly reclined in bed, bed alarm on. OTS offered a shower, pt agreed. Treatment session was focused on self-care retraining. Pt was supervision for lying > sitting EOB. Pt was CGA for sit > stand transfer to RW.  Pt ambulated short distance to bathroom using RW. Pt was min A with mod cueing for stepping over shower ledge. Pt will benefit from further training with bathroom transfers. Pt was supervision for UB bathing and min A for LB bathing. OTS provided cues for sequencing of bathing tasks. Pt requested to use the standard toilet due to spillage occurring when using BSC. Pt was CGA for sit > stand to standard toilet, pt mainly using grab bars to lower body to seat. Pt ambulated back to EOB with RW to complete dressing tasks. Pt completed UB dressing but expressed difficulties with identifying small slots for buttons. Pt may benefit from education on AE (button hook). OTS instructed to donn LB dressing while sitting then stand to conserve energy. Pt was Min A for adjusting LB dressing. Pt was Min A for donning footwear due to assistance with ted hose. OTS suggested pt identify errors when applying footwear, with good carryover. Pt was CGA for transferring to w/c from EOB. Left pt in w/c, safety belt  on, all needs met.   Therapy Documentation Precautions:  Precautions Precautions: Fall Precaution Comments: crani Restrictions Weight Bearing Restrictions: No  Pain: Pain Assessment Pain Scale: 0-10 Pain Score: 0-No pain Faces Pain Scale: No hurt ADL: ADL Eating: Not assessed Where Assessed-Eating: Edge of bed Upper Body Bathing: Supervision/safety Where Assessed-Upper Body Bathing: Shower Lower Body Bathing: Minimal assistance Where Assessed-Lower Body Bathing: Shower Upper Body Dressing: Supervision/safety Where Assessed-Upper Body Dressing: Edge of bed Lower Body Dressing: Minimal assistance Where Assessed-Lower Body Dressing: Edge of bed Toileting: Supervision/safety Where Assessed-Toileting: Glass blower/designer: Therapist, music Method: Counselling psychologist: Geophysical data processor: Environmental education officer Method: Radiographer, therapeutic: Shower seat with back, Grab bars   Therapy/Group: Individual Therapy  Egidio Lofgren 09/17/2020, 10:28 AM

## 2020-09-18 LAB — GLUCOSE, CAPILLARY: Glucose-Capillary: 117 mg/dL — ABNORMAL HIGH (ref 70–99)

## 2020-09-18 NOTE — Progress Notes (Signed)
Occupational Therapy Weekly Progress Note  Patient Details  Name: Gabriel [name] MRN: [id number] Date of Birth: [birth year]  Beginning of progress report period: September 11, 2020 End of progress report period: September 18, 2020  Today's Date: 09/18/2020 OT Individual Time: 9937-1696 OT Individual Time Calculation (min): 56 min    Patient has met 3 of 4 short term goals. Pt is making progress towards goals. Pt's activity tolerance fluctuates, making it difficult to properly assess progress through fluctuating assist needs. Once pt has become fatigued all safety awareness declines, resulting in impulsive descent during sit > stand transfers. Pt is CGA for sit > stand transfers (up to Mod A when fatigued).  Pt is able to correct posture once cued. Pt requires assistance with donning ted hose but otherwise donning LB dressing and footwear with supervision. Pt frequently provided with cues for sequencing of tasks.   Patient continues to demonstrate the following deficits: muscle weakness, decreased cardiorespiratoy endurance, impaired timing and sequencing and decreased coordination, and decreased standing balance, decreased postural control, and decreased balance strategies and therefore will continue to benefit from skilled OT intervention to enhance overall performance with BADL, iADL, and Reduce care partner burden.  Patient progressing toward long term goals..  Continue plan of care.  OT Short Term Goals OT Short Term Goal 1 (Week 1): Pt will complete bathing at shower level with CGA OT Short Term Goal 1 - Progress (Week 1): Met OT Short Term Goal 2 (Week 1): Pt will complete LB dressing (Including footwear) with CGA OT Short Term Goal 2 - Progress (Week 1): Partly met OT Short Term Goal 3 (Week 1): Pt will complete UB dressing with min assist OT Short Term Goal 3 - Progress (Week 1): Met OT Short Term Goal 4 (Week 1): Pt will complete 2 grooming tasks in standing at sink to demonstrate increased  standing balance and tolerance OT Short Term Goal 4 - Progress (Week 1): Met  OT Short Term Goal 1 (Week 2): STG = LTG due to LOS  Skilled Therapeutic Interventions/Progress Updates:   Met pt sitting in w/c, safety belt on, pt agreed to session. Treatment session was focused on dynamic standing balance, activity endurance and UE coordination. OTS offered self-care, pt declined but wanted to change shirts. Pt was CGA for sit > stand transfer from w/c to RW. Pt ambulated with RW to closet using BUE to reach for shirts. OTS provided cues for sequencing of task. Pt ambulated to recliner, using safe sitting techniques. Pt donned button-up shirt with supervision. Pt problem-solved adjusting shirt with no assistance. Pt requested to complete oral care while standing at sink with supervision. Pt was brought to main rehab gym to complete activity to increase standing balance while coordinating UE. Pt required min cues for following instructions of body placement to complete task. Pt reached across midline multiple times to complete activity while maintaining standing balance. Pt required 3 rest breaks throughout activity. Pt will continue to benefit from education on energy conservation techniques. Pt assisted with clean-up using RUE to cross midline for reaching while sitting. Pt was CGA for transferring from gym mat to w/c using RW. Pt was brought back to room to sit in the recliner, pt ambulated from w/c to recliner and "flopped" into seat. Due to fatigue, OTS educated on safety when sitting, did not have pt repeat transfer. Left pt sitting in recliner, chair alarm on, wife present.   Therapy Documentation Precautions:  Precautions Precautions: Fall Precaution Comments: crani Restrictions Weight  Bearing Restrictions: No Pain: Pain Assessment Pain Scale: 0-10 Pain Score: 0-No pain    Therapy/Group: Individual Therapy  Gabriel [name] 09/18/2020, 12:00 PM

## 2020-09-18 NOTE — Progress Notes (Signed)
PROGRESS NOTE   Subjective/Complaints: No complaints UA negative Staples removed yesterday, site C/D/I. Denies pain No issues overnight  ROS:  Pt denies SOB, abd pain, CP, N/V/C/D, and vision changes, pain   Objective:   No results found. Recent Labs    09/16/20 0556  WBC 7.5  HGB 10.5*  HCT 32.9*  PLT 184    No results for input(s): NA, K, CL, CO2, GLUCOSE, BUN, CREATININE, CALCIUM in the last 72 hours.   Intake/Output Summary (Last 24 hours) at 09/18/2020 1202 Last data filed at 09/18/2020 0719 Gross per 24 hour  Intake 358 ml  Output 755 ml  Net -397 ml        Physical Exam: Vital Signs Blood pressure 108/72, pulse 70, temperature (!) 97.5 F (36.4 C), temperature source Oral, resp. rate 17, weight 83.9 kg, SpO2 97 %. Gen: no distress, normal appearing HEENT: oral mucosa pink and moist, NCAT Cardio: Reg rate Chest: normal effort, normal rate of breathing Abd: soft, non-distended Ext: no clubbing, cyanosis, or edema Musculoskeletal:     Cervical back: Normal range of motion and neck supple.     Comments: RUE- 5/5 except finger abd 5-/5 LUE- 5/5 except finger abd 4+/5 RLE- HF 4+/5, KE 4/5, DF and PF 4-/5 LLE- HF 4-/5, KE 4-/5, DF 2-/5 and PF 2-/5 --no changes Skin:    General: Skin is warm and dry. Crani incision CDI with staples  Neurological:     Comments: alert. Follows basic commands. Fair insight and awareness. Can be tangential. Delayed processing L inattention noted on exam Intact to light touch in all 4 extremities-   Assessment/Plan: 1. Functional deficits which require 3+ hours per day of interdisciplinary therapy in a comprehensive inpatient rehab setting. Physiatrist is providing close team supervision and 24 hour management of active medical problems listed below. Physiatrist and rehab team continue to assess barriers to discharge/monitor patient progress toward functional and medical  goals  Care Tool:  Bathing    Body parts bathed by patient: Right arm, Left arm, Chest, Abdomen, Face, Right upper leg, Left upper leg   Body parts bathed by helper: Front perineal area, Buttocks     Bathing assist Assist Level: Minimal Assistance - Patient > 75%     Upper Body Dressing/Undressing Upper body dressing   What is the patient wearing?: Button up shirt    Upper body assist Assist Level: Supervision/Verbal cueing    Lower Body Dressing/Undressing Lower body dressing      What is the patient wearing?: Underwear/pull up, Pants     Lower body assist Assist for lower body dressing: Minimal Assistance - Patient > 75%     Toileting Toileting    Toileting assist Assist for toileting: Contact Guard/Touching assist     Transfers Chair/bed transfer  Transfers assist     Chair/bed transfer assist level: Contact Guard/Touching assist     Locomotion Ambulation   Ambulation assist      Assist level: Contact Guard/Touching assist Assistive device: Walker-rolling Max distance: 83   Walk 10 feet activity   Assist     Assist level: Contact Guard/Touching assist Assistive device: Walker-rolling   Walk 50 feet activity  Assist Walk 50 feet with 2 turns activity did not occur: Safety/medical concerns  Assist level: Contact Guard/Touching assist Assistive device: Walker-rolling    Walk 150 feet activity   Assist Walk 150 feet activity did not occur: Safety/medical concerns  Assist level: Contact Guard/Touching assist Assistive device: Walker-rolling    Walk 10 feet on uneven surface  activity   Assist Walk 10 feet on uneven surfaces activity did not occur: Safety/medical concerns         Wheelchair     Assist        Wheelchair assist level: Minimal Assistance - Patient > 75%      Wheelchair 50 feet with 2 turns activity    Assist        Assist Level: Minimal Assistance - Patient > 75%   Wheelchair 150 feet  activity     Assist      Assist Level: Minimal Assistance - Patient > 75%   Blood pressure 108/72, pulse 70, temperature (!) 97.5 F (36.4 C), temperature source Oral, resp. rate 17, weight 83.9 kg, SpO2 97 %.  Medical Problem List and Plan: 1.  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  Status post stereotactic right temporal craniotomy resection of brain metastasis 09/03/2020.  Follow-up medical oncology as well as radiation oncology with radiation therapy as directed.  Decadron protocol             -patient may shower once staples out             -ELOS/Goals: 12-16 days supervision  -Continue PT, OT and SLP   2.  Antithrombotics: -DVT/anticoagulation: SCDs.             -antiplatelet therapy: N/A 3. Pain Management:  N/A. Discontinue Norco  4. Situational anxiety: Continue Ativan 0.25 mg daily as needed prior to radiation and scans             -antipsychotic agents: N/A 5. Neuropsych: This patient is capable of making decisions on his own behalf. 6. Skin/Wound Care: Routine skin checks. Staples d/ced on 6/14- nursing order placed. Site is c/d/i 7. Fluids/Electrolytes/Nutrition: Routine in and outs with follow-up chemistries 8.  Seizure prophylaxis.  d/c Keppra, completed course 9.  Hypertension.  Continue Lisinopril 10 mg daily, HCTZ 12.5 mg daily.  Monitor with increased mobility 10.  Hyperlipidemia.  Lipitor 11.  Constipation.  Senokot twice daily, Colace twice daily, Dulcolax suppository daily as needed. Received sorbitol once.  12. Hemorrhoids: bleeding, painless, discussed hgb stable, repeat Monday 13. Disposition: HFU scheduled with me on 11/22/20 at 2pm. D/c 6/22.        LOS: 8 days A FACE TO FACE EVALUATION WAS PERFORMED  Martha Clan P Nalin Mazzocco 09/18/2020, 12:02 PM

## 2020-09-18 NOTE — Progress Notes (Signed)
Physical Therapy Session Note  Patient Details  Name: Gabriel [name] MRN: [id number] Date of Birth: 03-28-1931  Today's Date: 09/18/2020 PT Individual Time: 4163-8453 PT Individual Time Calculation (min): 72 min   Short Term Goals: Week 1:  PT Short Term Goal 1 (Week 1): Pt will ambulate 135ft with CGA PT Short Term Goal 2 (Week 1): Pt will perform bed mobility with supervision assist PT Short Term Goal 3 (Week 1): Pt will improve Berg balance score to >35 to indicate improved safety with functional mobility PT Short Term Goal 4 (Week 1): Pt will propell WC >174ft with supervision assist   Skilled Therapeutic Interventions/Progress Updates:    Patient received supine in bed, agreeable to PT. He denies pain. He was able to come sit edge of bed with HOB elevated, supervision and extended time. Patient transferring to wc via stand pivot with MinA. PT transporting patient in wc to therapy gym for time management and energy conservation. Patient ambulating 53ft x2 with rest break in between. RW + MinA. Patient with perpetual R lateral lean, but appears to be scoliosis- like in nature. Patient does report some pre-morbid postural impairments, but was not specific in what they were. Patient requiring verbal cues to remain within walker frame while ambulating. He was able to negotiate 4x2 stairs with B HR use and CGA/MinA. Self-selecting reciprocal stepping when ascending and step- to when descending. Discussed with patient step-to pattern when ascending for safety and energy conservation purposes. Patient completing the following therex: hip abduction, LAQ, marches with 4# ankle weights, 3x12. Patient also completing NuStep x8 mins for improved endurance. He did report 7/10 fatigue while on NuStep, but no SOB noted/reported. Patient returning to room in wc, seatbelt alarm on, call light within reach.  -stairs 4x2 -hip abd, march, laq -nustep   Therapy Documentation Precautions:   Precautions Precautions: Fall Precaution Comments: crani Restrictions Weight Bearing Restrictions: No     Therapy/Group: Individual Therapy  Karoline Caldwell, PT, DPT, CBIS  09/18/2020, 7:46 AM

## 2020-09-18 NOTE — Evaluation (Signed)
Recreational Therapy Assessment and Plan  Patient Details  Name: Gabriel [name] MRN: [id number] Date of Birth: 06-23-1930 Today's Date: 09/18/2020  Rehab Potential:  [facility] ELOS:   d/c 6/22  Assessment   Hospital Problem: Principal Problem:   Metastatic melanoma [facility])     Past Medical History:      Past Medical History:  Diagnosis Date   Cancer ([place])      melonoma on stomach, lung, brain   Gait disorder 02/04/2017    cane   High cholesterol     Hypertension     Pneumonia     Stroke ([place]) 04/2019 last one    gait unsteady- . uses a cane,2-3 mini strokes    Past Surgical History:       Past Surgical History:  Procedure Laterality Date   APPLICATION OF CRANIAL NAVIGATION N/A 09/03/2020    Procedure: APPLICATION OF CRANIAL NAVIGATION;  Surgeon: [name], MD;  Location: [place];  Service: Neurosurgery;  Laterality: N/A;  RM 20   BRONCHIAL BIOPSY   08/20/2020    Procedure: BRONCHIAL BIOPSIES;  Surgeon: Garner Nash, DO;  Location: [place] ENDOSCOPY;  Service: Pulmonary;;   BRONCHIAL BRUSHINGS   08/20/2020    Procedure: BRONCHIAL BRUSHINGS;  Surgeon: Garner Nash, DO;  Location: [place];  Service: Pulmonary;;   BRONCHIAL NEEDLE ASPIRATION BIOPSY   08/20/2020    Procedure: BRONCHIAL NEEDLE ASPIRATION BIOPSIES;  Surgeon: Garner Nash, DO;  Location: [place];  Service: Pulmonary;;   BRONCHIAL WASHINGS   08/20/2020    Procedure: BRONCHIAL WASHINGS;  Surgeon: Garner Nash, DO;  Location: [place];  Service: Pulmonary;;   CRANIOTOMY Right 09/03/2020    Procedure: Stereotactic right temporal craniotomy for resection of tumor;  Surgeon: [name], MD;  Location: [place];  Service: Neurosurgery;  Laterality: Right;   HEMOSTASIS CONTROL   08/20/2020    Procedure: HEMOSTASIS CONTROL;  Surgeon: Garner Nash, DO;  Location: [place] [facility];  Service: Pulmonary;;   VIDEO BRONCHOSCOPY WITH ENDOBRONCHIAL NAVIGATION Left 08/20/2020    Procedure: VIDEO  BRONCHOSCOPY WITH ENDOBRONCHIAL NAVIGATION;  Surgeon: Garner Nash, DO;  Location: [place];  Service: Pulmonary;  Laterality: Left;   VIDEO BRONCHOSCOPY WITH ENDOBRONCHIAL ULTRASOUND Left 08/20/2020    Procedure: VIDEO BRONCHOSCOPY WITH ENDOBRONCHIAL ULTRASOUND;  Surgeon: Garner Nash, DO;  Location: [place];  Service: Pulmonary;  Laterality: Left;      Assessment & Plan Clinical Impression: Patient is a 87 y.o. right-handed male with history of hyperlipidemia, hypertension, CVA with unsteady gait maintained on Plavix.  Per chart review patient lives with spouse.  Independent with assistive device.  1 level home 2 steps to entry.  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  CT scan revealed left upper lobe mass 7.8 x 6.8 cm in size.  A small left hilar node was also identified.  Additionally there was renal masses concerning for primary renal disease versus metastatic disease.  MRI of the brain demonstrated 3 intracranial metastases including 2 small subcentimeter metastases and a larger right hemorrhagic lesion.  Patient was established with medical oncology Dr. Mohammed/radiation oncology Dr. Lisbeth Renshaw.  PET scan completed showing hypermetabolic lingular mass with central necrosis consistent with primary bronchogenic carcinoma.  No evidence of mediastinal nodal metastasis.  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  Patient was admitted 09/03/2020 and underwent stereotactic right temporal craniotomy for resection of brain metastasis per Dr.  Nundkumar.  Surgical pathology report consistent with metastatic melanoma.  Maintained on Keppra for seizure prophylaxis.  Decadron protocol as indicated.  Medical oncology as well as radiation oncology continue to follow with radiation therapy as directed.  Tolerating a regular consistency diet.  No current plan at this time for DVT  prophylaxis due to some bleeding hemorrhoids.  Therapy evaluations completed due to patient decreased functional mobility patient was admitted for a comprehensive rehab program.  Patient transferred to CIR on 09/10/2020 .     Met with pt to discuss leisure interests, activity modifications, coping strategies.   Pt presents with decreased activity tolerance, decreased functional mobility, decreased balance, decreased coordination, left inattention, decreased attention, decreased awareness, decreased problem solving, decreased safety awareness and decreased memory Limiting pt's independence with leisure/community pursuits.M   Recommendations for other services: Other: Palliative Care Consult  Discharge Criteria: Patient will be discharged from TR if patient refuses treatment 3 consecutive times without medical reason.  If treatment goals not met, if there is a change in medical status, if patient makes no progress towards goals or if patient is discharged from hospital.  The above assessment, treatment plan, treatment alternatives and goals were discussed and mutually agreed upon: by patient  [place] 09/18/2020, 3:42 PM

## 2020-09-18 NOTE — Progress Notes (Signed)
Speech Language Pathology Daily Session Note  Patient Details  Name: Gabriel [name] MRN: [id number] Date of Birth: 1930-06-14  Today's Date: 09/18/2020 SLP Individual Time: 1440-1530 SLP Individual Time Calculation (min): 50 min  Short Term Goals: Week 2: SLP Short Term Goal 1 (Week 2): Pt will demonstrate alternating attention in 5-7 minute intervals with supervision A verbal cues, SLP Short Term Goal 2 (Week 2): Pt will demonstrate mildyl complex problem solving skills in familiar tasks with mod A verbal cues. SLP Short Term Goal 3 (Week 2): Pt will demonstrate self-monitoring in basic problem solving tasks with min A verbal cues. SLP Short Term Goal 4 (Week 2): Pt will demonstrate recall of novel, functional information with mod A verbal cues for compensatory aids.  Skilled Therapeutic Interventions: Skilled SLP treatment performed with focus on cognitive goals. SLP facilitated mildly complex problem solving and verbal reasoning task with min-to-mod verbal cues. Able to sustain attention throughout session with no additional support. Occasional verbal cues were beneficial while progressing through task and between topics. Patient left seated in reclining chair with alarm activated and needs within reach. Continue with ST plan of care.   Pain Pain Assessment Pain Scale: 0-10 Pain Score: 0-No pain  Therapy/Group: Individual Therapy  Patty Sermons 09/18/2020, 3:57 PM

## 2020-09-19 LAB — URINE CULTURE: Culture: <10000 — AB

## 2020-09-19 NOTE — Progress Notes (Signed)
Physical Therapy Session Note  Patient Details  Name: Gabriel [name] MRN: [id number] Date of Birth: Jun 12, 1930  Today's Date: 09/19/2020 PT Group Time: 3967-2897 PT Group Time Calculation (min): 59 min  Short Term Goals: Week 2:  PT Short Term Goal 1 (Week 2): STG=LTG due to LOS  Skilled Therapeutic Interventions/Progress Updates:    Patient participated in a Therapeutic Activity Group targeting cardiopulmonary endurance via the following activities: - Kinetron in sitting for 8 minutes - 5x sit to stands using B UE support on w/c armrests to assist with coming to stand (unable to come to stand without this support) and RW support once in standing for balance with CGA for safety - gait training ~147ft using RW with CGA progressed to min assist for balance due to pt fatigue - pt demos progressively shorter more shuffled step lengths with wide BOS   - when turning to sit in w/c pt not turning fully requiring mod assist to pivot hips and land safely in w/c - pt did not demonstrate this at any other time during session, anticipate it was due to B LE fatigue  At end of session pt transported back to room and requests to sit in recliner,  left with needs in reach and chair alarm on.  Therapy Documentation Precautions:  Precautions Precautions: Fall Precaution Comments: crani Restrictions Weight Bearing Restrictions: No  Pain:  Denies pain during session.   Therapy/Group: Group Therapy  Tawana Scale , PT, DPT, CSRS 09/19/2020, 12:24 PM

## 2020-09-19 NOTE — Progress Notes (Signed)
Physical Therapy Weekly Progress Note  Patient Details  Name: Gabriel [name] MRN: [id number] Date of Birth: 03/26/1931  Beginning of progress report period: September 11, 2020 End of progress report period: September 19, 2020  Today's Date: 09/19/2020 PT Individual Time: 3893-7342 PT Individual Time Calculation (min): 58 min   Patient has met 3 of 4 short term goals. Pt demonstrates gradual improvements towards long term goals however can fluctuate and require up to heavy min A. Pt is able to perform bed mobility with supervision, transfers with RW with CGA/min A, ambulate >111ft with RW and CGA, and navigate 12 steps with 2 rails with CGA. Pt continues to be limited by fatigue, generalized weakness and decreased endurance, and decreased balance/postural control.   Patient continues to demonstrate the following deficits muscle weakness, decreased cardiorespiratoy endurance, and decreased standing balance, decreased postural control, and decreased balance strategies and therefore will continue to benefit from skilled PT intervention to increase functional independence with mobility.  Patient progressing toward long term goals..  Continue plan of care.  PT Short Term Goals Week 1:  PT Short Term Goal 1 (Week 1): Pt will ambulate 167ft with CGA PT Short Term Goal 1 - Progress (Week 1): Met PT Short Term Goal 2 (Week 1): Pt will perform bed mobility with supervision assist PT Short Term Goal 2 - Progress (Week 1): Met PT Short Term Goal 3 (Week 1): Pt will improve Berg balance score to >35 to indicate improved safety with functional mobility PT Short Term Goal 3 - Progress (Week 1): Progressing toward goal PT Short Term Goal 4 (Week 1): Pt will propell WC >126ft with supervision assist PT Short Term Goal 4 - Progress (Week 1): Met Week 2:  PT Short Term Goal 1 (Week 2): STG=LTG due to LOS  Skilled Therapeutic Interventions/Progress Updates:  Ambulation/gait training;Community  reintegration;DME/adaptive equipment instruction;Neuromuscular re-education;Psychosocial support;Stair training;UE/LE Strength taining/ROM;Wheelchair propulsion/positioning;Balance/vestibular training;Discharge planning;Functional electrical stimulation;Pain management;Skin care/wound management;Therapeutic Activities;UE/LE Coordination activities;Cognitive remediation/compensation;Disease management/prevention;Functional mobility training;Splinting/orthotics;Patient/family education;Therapeutic Exercise;Visual/perceptual remediation/compensation   Today's Interventions: Received pt sitting in recliner, pt agreeable to PT treatment, and denied any pain during session. Session with emphasis on functional mobility/transfers, generalized strengthening, dynamic standing balance/coordination, gait training, and improved activity tolerance. Sit<>stand with RW and CGA and ambulated 37ft with RW and CGA to recliner. Pt transported to 4W dayroom in [facility] total A for time management purposes. Pt ambulated 24ft with RW and CGA; pt occasionally hitting LLE against RW requiring cues to keep feet inside RW and to increase LLE step length. Worked on blocked practice sit<>stands and pt able to perform 2x8 without AD (but using UEs) with CGA fading to close supervision. Pt performed the following exercises: -seated LAQ 2x10 bilaterally with 2lb ankle weight  -seated hip flexion 2x10 bilaterally with 2lb ankle weight -seated hip adduction ball squeezes 10x5 second isometric hold -seated hamstring curls with grn TB x10 bilaterally  -standing heel raises x12 with BUE support and CGA -standing hip abduction x8 bilaterally with BUE support and CGA Pt reported feeling "weary" and not as social this morning as normal. When asked, pt reported 6/10 fatigue. MD present for morning rounds and discussed moving D/C date to 6/23 per pt's wife request. Stand<>pivot mat<>WC with RW and CGA. Pt performed WC mobility 160ft using BUE and  supervision and transported remainder of way back to room in [facility] total A. Pt required cues for stride length and propulsion technique. Concluded session with pt sitting in WC, needs within reach, and seatbelt alarm on.  Provided pt with fresh ice water.   Therapy Documentation Precautions:  Precautions Precautions: Fall Precaution Comments: crani Restrictions Weight Bearing Restrictions: No  Therapy/Group: Individual Therapy Alfonse Alpers PT, DPT   09/19/2020, 7:19 AM

## 2020-09-19 NOTE — Progress Notes (Signed)
Speech Language Pathology Daily Session Note  Patient Details  Name: Gabriel [name] MRN: [id number] Date of Birth: 1930-06-14  Today's Date: 09/19/2020 SLP Individual Time: 9774-1423 SLP Individual Time Calculation (min): 40 min  Short Term Goals: Week 2: SLP Short Term Goal 1 (Week 2): Pt will demonstrate alternating attention in 5-7 minute intervals with supervision A verbal cues, SLP Short Term Goal 2 (Week 2): Pt will demonstrate mildyl complex problem solving skills in familiar tasks with mod A verbal cues. SLP Short Term Goal 3 (Week 2): Pt will demonstrate self-monitoring in basic problem solving tasks with min A verbal cues. SLP Short Term Goal 4 (Week 2): Pt will demonstrate recall of novel, functional information with mod A verbal cues for compensatory aids.  Skilled Therapeutic Interventions: Skilled SLP intervention focused on cognition. Pt reported wife sorts his medications and uses a 1x per day pill organizer. Pt informed he takes a medication 3 times per day to minimize swelling and given recommendation to use 3x per day pill organizer. Pt given min A to call wife on cell phone during session. Pt and wife educated on medications and pill organizer. Pt completed one step written directions with 75 % accuracy and min A for error awareness and attention to written details. Pt left seated upright in wheelchair with chair alarm set and call button within reach. Cont with therapy per plan of care.     Pain Pain Assessment Pain Scale: Faces Pain Score: 0-No pain  Therapy/Group: Individual Therapy  [name] 09/19/2020, 8:16 AM

## 2020-09-19 NOTE — Progress Notes (Signed)
PROGRESS NOTE   Subjective/Complaints: No complaints this morning His wife has scheduled her personal medical appointments on 6/22 thinking that he would be home on 6/23- discussed with team that we can extend patient one day, will show wife his transfers during caregiver training  ROS:  Pt denies SOB, abd pain, CP, N/V/C/D, and vision changes, pain   Objective:   No results found. No results for input(s): WBC, HGB, HCT, PLT in the last 72 hours.   No results for input(s): NA, K, CL, CO2, GLUCOSE, BUN, CREATININE, CALCIUM in the last 72 hours.   Intake/Output Summary (Last 24 hours) at 09/19/2020 1257 Last data filed at 09/19/2020 0750 Gross per 24 hour  Intake 595 ml  Output 900 ml  Net -305 ml        Physical Exam: Vital Signs Blood pressure 119/71, pulse 77, temperature 98.2 F (36.8 C), resp. rate 17, weight 83.9 kg, SpO2 99 %. Gen: no distress, normal appearing HEENT: oral mucosa pink and moist, NCAT Cardio: Reg rate Chest: normal effort, normal rate of breathing Abd: soft, non-distended Ext: no edema Psych: pleasant, normal affect  Musculoskeletal:     Cervical back: Normal range of motion and neck supple.     Comments: RUE- 5/5 except finger abd 5-/5 LUE- 5/5 except finger abd 4+/5 RLE- HF 4+/5, KE 4/5, DF and PF 4-/5 LLE- HF 4-/5, KE 4-/5, DF 2-/5 and PF 2-/5 --no changes Skin:    General: Skin is warm and dry. Crani incision CDI with staples  Neurological:     Comments: alert. Follows basic commands. Fair insight and awareness. Can be tangential. Delayed processing L inattention noted on exam Intact to light touch in all 4 extremities-   Assessment/Plan: 1. Functional deficits which require 3+ hours per day of interdisciplinary therapy in a comprehensive inpatient rehab setting. Physiatrist is providing close team supervision and 24 hour management of active medical problems listed  below. Physiatrist and rehab team continue to assess barriers to discharge/monitor patient progress toward functional and medical goals  Care Tool:  Bathing    Body parts bathed by patient: Right arm, Left arm, Chest, Abdomen, Face, Right upper leg, Left upper leg   Body parts bathed by helper: Front perineal area, Buttocks     Bathing assist Assist Level: Minimal Assistance - Patient > 75%     Upper Body Dressing/Undressing Upper body dressing   What is the patient wearing?: Button up shirt    Upper body assist Assist Level: Supervision/Verbal cueing    Lower Body Dressing/Undressing Lower body dressing      What is the patient wearing?: Underwear/pull up, Pants     Lower body assist Assist for lower body dressing: Minimal Assistance - Patient > 75%     Toileting Toileting    Toileting assist Assist for toileting: Contact Guard/Touching assist     Transfers Chair/bed transfer  Transfers assist     Chair/bed transfer assist level: Minimal Assistance - Patient > 75%     Locomotion Ambulation   Ambulation assist      Assist level: Contact Guard/Touching assist Assistive device: Walker-rolling Max distance: 270ft   Walk 10 feet activity  Assist     Assist level: Contact Guard/Touching assist Assistive device: Walker-rolling   Walk 50 feet activity   Assist Walk 50 feet with 2 turns activity did not occur: Safety/medical concerns  Assist level: Contact Guard/Touching assist Assistive device: Walker-rolling    Walk 150 feet activity   Assist Walk 150 feet activity did not occur: Safety/medical concerns  Assist level: Contact Guard/Touching assist Assistive device: Walker-rolling    Walk 10 feet on uneven surface  activity   Assist Walk 10 feet on uneven surfaces activity did not occur: Safety/medical concerns         Wheelchair     Assist Will patient use wheelchair at discharge?: No Type of Wheelchair: Manual     Wheelchair assist level: Supervision/Verbal cueing Max wheelchair distance: 177ft    Wheelchair 50 feet with 2 turns activity    Assist        Assist Level: Supervision/Verbal cueing   Wheelchair 150 feet activity     Assist      Assist Level: Supervision/Verbal cueing   Blood pressure 119/71, pulse 77, temperature 98.2 F (36.8 C), resp. rate 17, weight 83.9 kg, SpO2 99 %.  Medical Problem List and Plan: 1.  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  Status post stereotactic right temporal craniotomy resection of brain metastasis 09/03/2020.  Follow-up medical oncology as well as radiation oncology with radiation therapy as directed.  Decadron protocol             -patient may shower once staples out             -ELOS/Goals: 12-16 days supervision  -Continue PT, OT and SLP   2.  Antithrombotics: -DVT/anticoagulation: SCDs.             -antiplatelet therapy: N/A 3. Pain Management:  N/A. Discontinue Norco  4. Situational anxiety: Discontinue Ativan 0.25- no need for scans.              -antipsychotic agents: N/A 5. Neuropsych: This patient is capable of making decisions on his own behalf. 6. Skin/Wound Care: Routine skin checks. Staples d/ced on 6/14- nursing order placed. Site is c/d/i 7. Fluids/Electrolytes/Nutrition: Routine in and outs with follow-up chemistries 8.  Seizure prophylaxis.  d/c Keppra, completed course 9.  Hypertension.  Continue Lisinopril 10 mg daily, HCTZ 12.5 mg daily.  Monitor with increased mobility 10.  Hyperlipidemia.  Continue Lipitor 11.  Constipation.  Senokot twice daily, Colace twice daily, Dulcolax suppository daily as needed. Received sorbitol once.  12. Hemorrhoids: bleeding, painless, discussed hgb stable, repeat Monday 13. Disposition: HFU scheduled with me on 11/22/20 at 2pm. D/c 6/22.        LOS: 9 days A FACE TO FACE EVALUATION WAS PERFORMED  Clide Deutscher Gabriel [name] 09/19/2020, 12:57 PM

## 2020-09-19 NOTE — Progress Notes (Signed)
Patient ID: Gabriel [name], male   DOB: Dec 19, 1930, 87 y.o.   MRN: [id number]  [place] with wife via telephone to discuss team conference goals and target discharge date of 6/22. Have scheduled family education for Monday 6/20 at 9-12:00. Agreeable to equipment and wants PT to work on using walker to go sideways into bathroom door since very narrow and she had to do this when she had surgery in the past. Continue to work on discharge needs.

## 2020-09-19 NOTE — Progress Notes (Signed)
Occupational Therapy Session Note  Patient Details  Name: Gabriel [name] MRN: [id number] Date of Birth: 1930/09/28  Today's Date: 09/19/2020 OT Individual Time: 1006-1100 OT Individual Time Calculation (min): 54 min    Short Term Goals: Week 2:  OT Short Term Goal 1 (Week 2): STG = LTG due to LOS  Skilled Therapeutic Interventions/Progress Updates:    Pt sitting up in the wheelchair to start session.  He was rolled down to the dayroom to start with transfer to the [place] for overall strengthening.  He was able to complete transfer with min assist and mod instructional cueing for squaring up to the surface of the [place] seat and for reaching back.  He was then able to complete 3 sets of 2, 3, and 3 mins with resistance set on level 3 as well.  He was able to maintain steps at around 37-43 for each set with pt stopping secondary to increased fatigue.  He transferred back to the wheelchair and was taken down to the ADL apartment where he completed simulated step in shower transfers with use of the RW.  Mod instructional cueing with min assist for technique to step in backwards and then sit on the seat.  He is unsure if his shower is large enough to use the RW stepping forward, but this may be an option if his wife can help determine this.  Do feel that he will need a tub seat for sure.  Returned to the room at end of session where he was left sitting up with NT in to assist with toilet transfer per request.    Therapy Documentation Precautions:  Precautions Precautions: Fall Precaution Comments: crani Restrictions Weight Bearing Restrictions: No  Pain: Pain Assessment Pain Scale: Faces Pain Score: 0-No pain ADL: See Care Tool Section for some details of mobility and selfcare   Therapy/Group: Individual Therapy  Felicia Both OTR/L 09/19/2020, 12:57 PM

## 2020-09-20 ENCOUNTER — Inpatient Hospital Stay (HOSPITAL_COMMUNITY): Payer: Medicare Other

## 2020-09-20 LAB — COMPREHENSIVE METABOLIC PANEL
ALT: 27 U/L (ref 0–44)
AST: 19 U/L (ref 15–41)
Albumin: 3 g/dL — ABNORMAL LOW (ref 3.5–5.0)
Alkaline Phosphatase: 75 U/L (ref 38–126)
Anion gap: 11 (ref 5–15)
BUN: 38 mg/dL — ABNORMAL HIGH (ref 8–23)
CO2: 23 mmol/L (ref 22–32)
Calcium: 8.8 mg/dL — ABNORMAL LOW (ref 8.9–10.3)
Chloride: 99 mmol/L (ref 98–111)
Creatinine, Ser: 1.43 mg/dL — ABNORMAL HIGH (ref 0.61–1.24)
GFR, Estimated: 47 mL/min — ABNORMAL LOW (ref >60)
Glucose, Bld: 121 mg/dL — ABNORMAL HIGH (ref 70–99)
Potassium: 4 mmol/L (ref 3.5–5.1)
Sodium: 133 mmol/L — ABNORMAL LOW (ref 135–145)
Total Bilirubin: 0.9 mg/dL (ref 0.3–1.2)
Total Protein: 5.6 g/dL — ABNORMAL LOW (ref 6.5–8.1)

## 2020-09-20 LAB — CBC WITH DIFFERENTIAL/PLATELET
Abs Immature Granulocytes: 0.12 10*3/uL — ABNORMAL HIGH (ref 0.00–0.07)
Basophils Absolute: 0 10*3/uL (ref 0.0–0.1)
Basophils Relative: 0 %
Eosinophils Absolute: 0.1 10*3/uL (ref 0.0–0.5)
Eosinophils Relative: 1 %
HCT: 33 % — ABNORMAL LOW (ref 39.0–52.0)
Hemoglobin: 10.7 g/dL — ABNORMAL LOW (ref 13.0–17.0)
Immature Granulocytes: 2 %
Lymphocytes Relative: 10 %
Lymphs Abs: 0.7 10*3/uL (ref 0.7–4.0)
MCH: 27.9 pg (ref 26.0–34.0)
MCHC: 32.4 g/dL (ref 30.0–36.0)
MCV: 86.2 fL (ref 80.0–100.0)
Monocytes Absolute: 0.6 10*3/uL (ref 0.1–1.0)
Monocytes Relative: 9 %
Neutro Abs: 5 10*3/uL (ref 1.7–7.7)
Neutrophils Relative %: 78 %
Platelets: 152 10*3/uL (ref 150–400)
RBC: 3.83 MIL/uL — ABNORMAL LOW (ref 4.22–5.81)
RDW: 18.1 % — ABNORMAL HIGH (ref 11.5–15.5)
WBC: 6.5 10*3/uL (ref 4.0–10.5)
nRBC: 0 % (ref 0.0–0.2)

## 2020-09-20 LAB — IRON AND TIBC
Iron: 104 ug/dL (ref 45–182)
Saturation Ratios: 41 % — ABNORMAL HIGH (ref 17.9–39.5)
TIBC: 253 ug/dL (ref 250–450)
UIBC: 149 ug/dL

## 2020-09-20 LAB — MAGNESIUM: Magnesium: 1.8 mg/dL (ref 1.7–2.4)

## 2020-09-20 LAB — VITAMIN D 25 HYDROXY (VIT D DEFICIENCY, FRACTURES): Vit D, 25-Hydroxy: 12.3 ng/mL — ABNORMAL LOW (ref 30–100)

## 2020-09-20 IMAGING — CT CT HEAD W/O CM
4 series · 15 of 47 positions shown, 17 images · non-contrast
Comparison: Correlation made with MRI brain [DATE]

CLINICAL DATA: Mental status change, history of metastatic disease

EXAM:
CT HEAD WITHOUT CONTRAST
TECHNIQUE: Contiguous axial images were obtained from the base of the skull
through the vertex without intravenous contrast.

[Series 3: head without · axial · non-contrast · 0.48mm/px · z∈[-63,+52]mm · 7 of 31 slices shown, 9 images]
[im 4/31  brain]
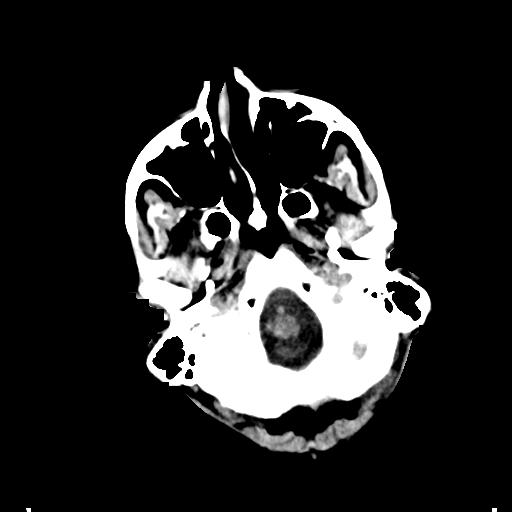
[im 4/31  bone]
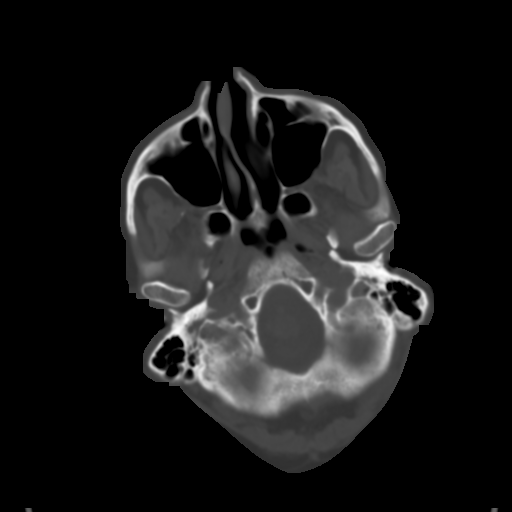
[im 8/31  brain]
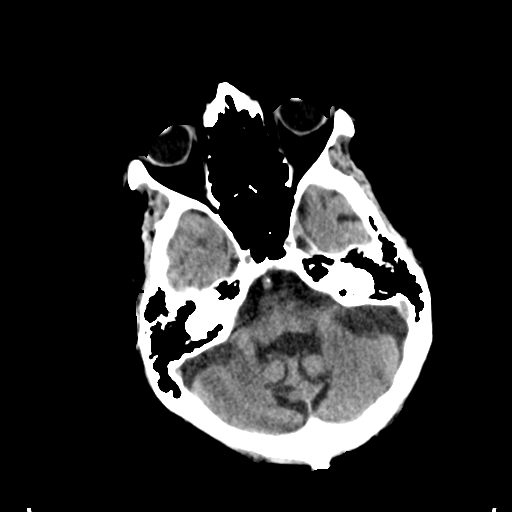
[im 12/31  brain]
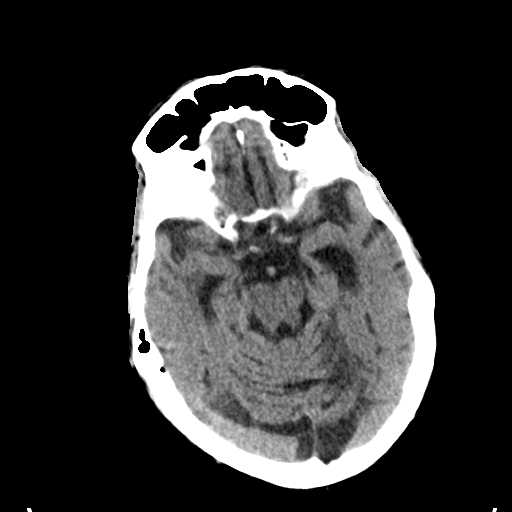
[im 16/31  brain]
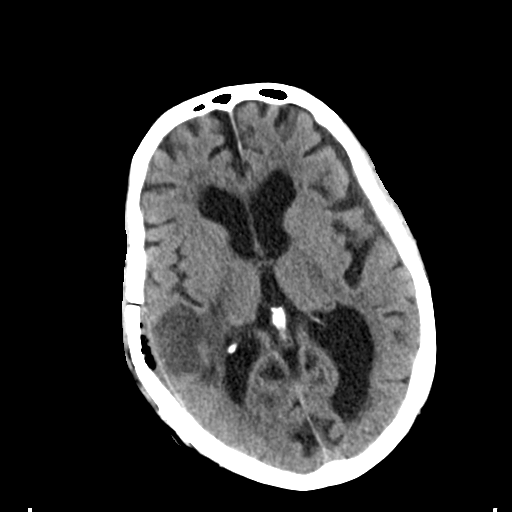
[im 19/31  brain]
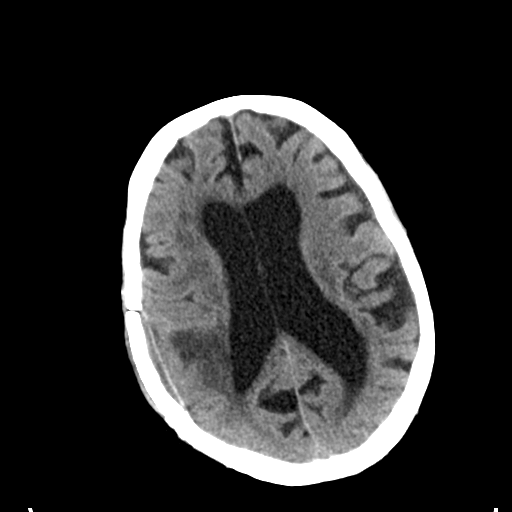
[im 19/31  bone]
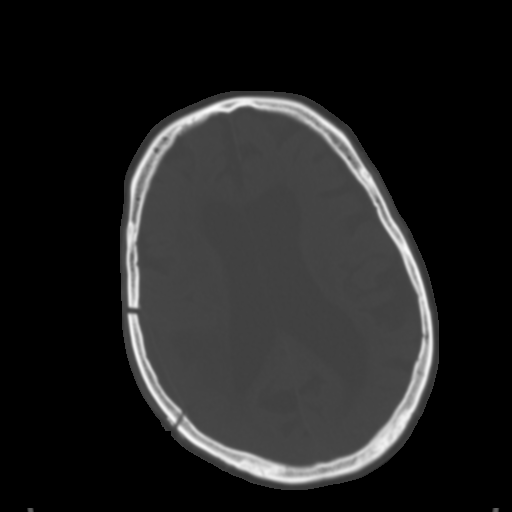
[im 23/31  brain]
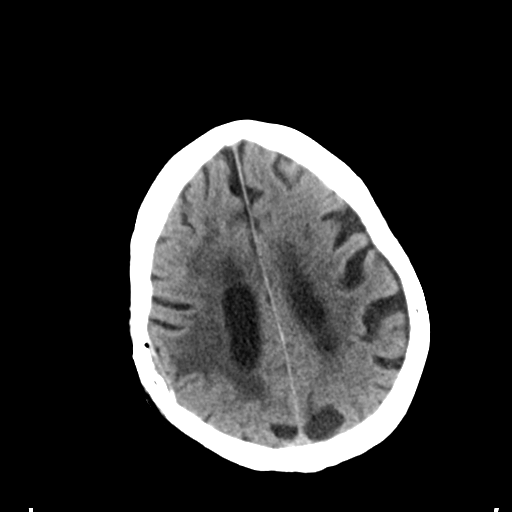
[im 27/31  brain]
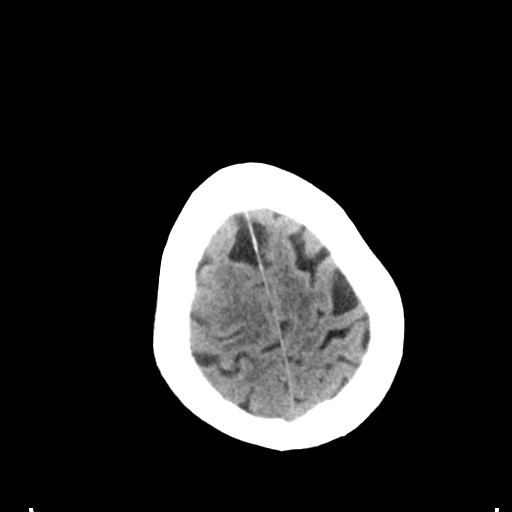

[Series 4: head bone · axial · 0.48mm/px · z∈[-64,-48]mm · 2 of 77 slices shown]
[im 8/77  bone]
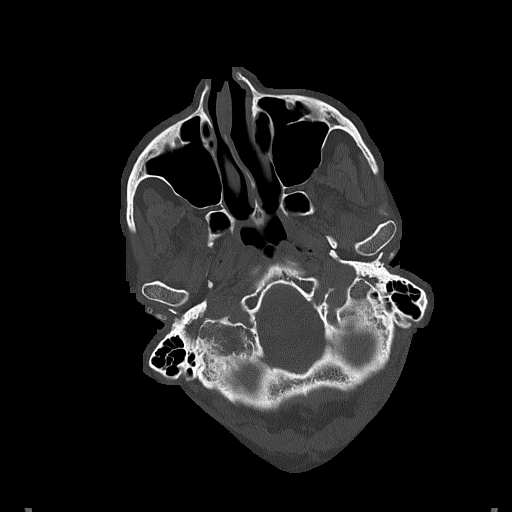
[im 16/77  bone]
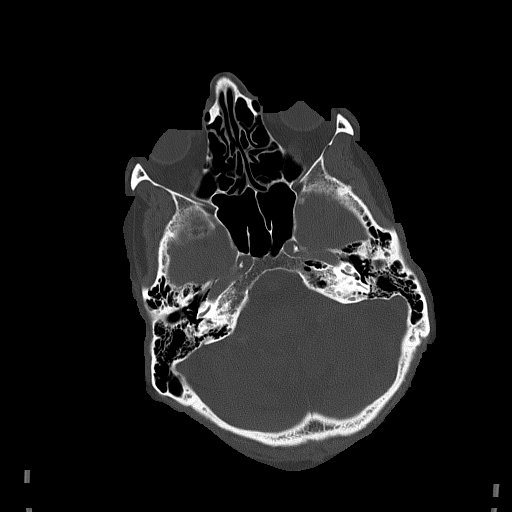

[Series 5: head without cor · coronal · non-contrast · 0.31mm/px · 3 of 74 slices shown]
[im 25/74  brain]
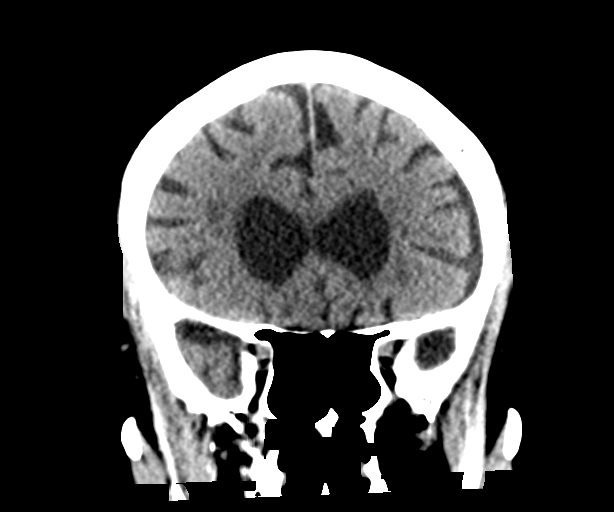
[im 33/74  brain]
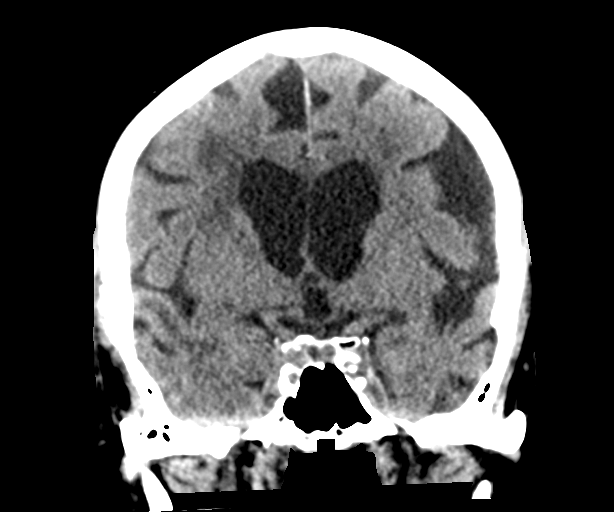
[im 41/74  brain]
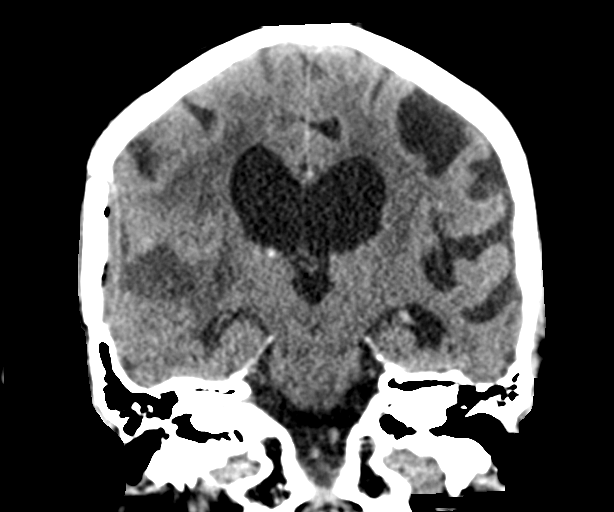

[Series 6: head without sag · sagittal · non-contrast · 0.32mm/px · 3 of 64 slices shown]
[im 22/64  brain]
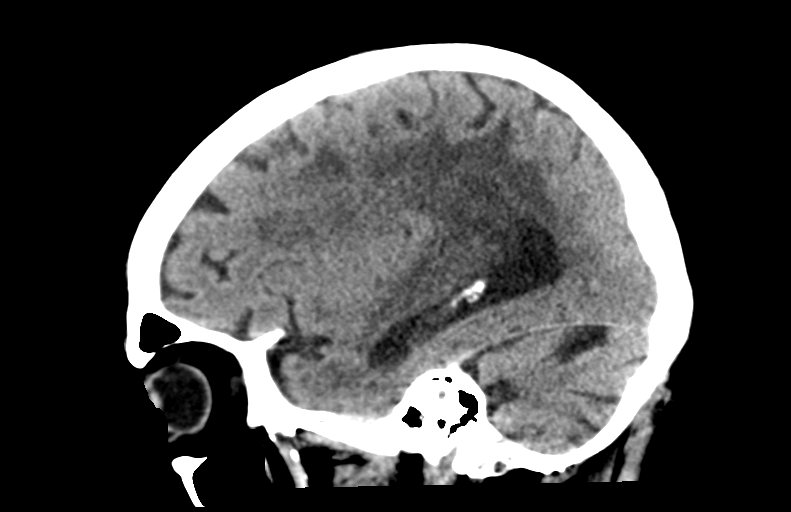
[im 32/64  brain]
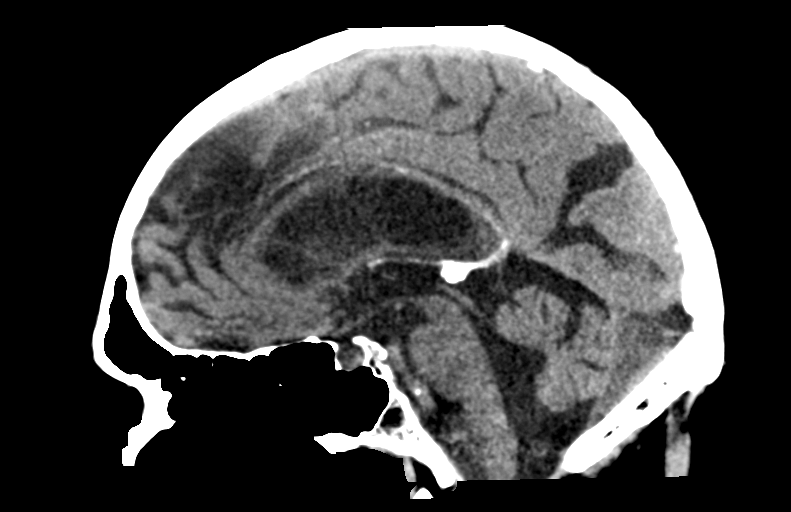
[im 43/64  brain]
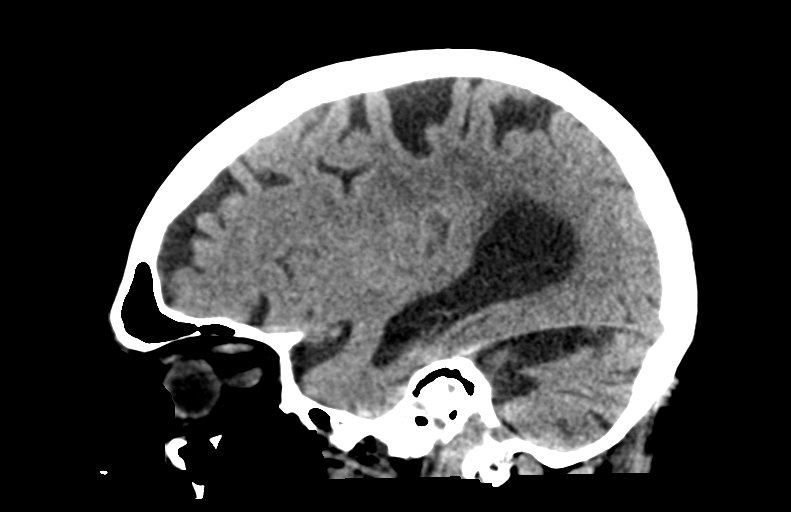

[15 of 47 positions shown; findings below may reference images not displayed]

FINDINGS: Brain: There is no acute intracranial hemorrhage or significant mass
effect. No acute appearing loss of gray-white differentiation.

Right temporal resection cavity is noted. Thin extra-axial
collection containing fluid and air remains present. Ventral
extra-axial air as resolved. Surrounding hypoattenuation extending
into the frontoparietal lobes is similar in extent to signal
abnormality on MRI.

Left inferior frontal and left cerebellar lesions on MRI are not
visible by CT.

Additional patchy low-attenuation in the supratentorial white matter
is nonspecific but probably reflects chronic microvascular and/or
treatment related changes. Prominence of the ventricles and sulci
reflects cerebral and cerebellar parenchymal volume loss. No
hydrocephalus.

Vascular: There is atherosclerotic calcification at the skull base.

Skull: Right craniotomy.  Otherwise unremarkable.

Sinuses/Orbits: No acute finding.

Other: None.
IMPRESSION: No acute intracranial hemorrhage or evidence of acute infarction. No
significant mass effect.

Expected evolution of recent postoperative changes.

## 2020-09-20 MED ORDER — VITAMIN D (ERGOCALCIFEROL) 1.25 MG (50000 UNIT) PO CAPS
50000.0000 [IU] | ORAL_CAPSULE | ORAL | Status: DC
  Administered 2020-09-20 – 2020-09-27 (×2): 50000 [IU] via ORAL
  Filled 2020-09-20 (×3): qty 1

## 2020-09-20 MED ORDER — MAGNESIUM SULFATE 2 GM/50ML IV SOLN
2.0000 g | Freq: Once | INTRAVENOUS | Status: AC
  Administered 2020-09-20: 2 g via INTRAVENOUS
  Filled 2020-09-20: qty 50

## 2020-09-20 NOTE — Progress Notes (Signed)
Occupational Therapy Session Note  Patient Details  Name: Gabriel [name] MRN: [id number] Date of Birth: [birth year]  Today's Date: 09/20/2020 OT Individual Time: 0051-1021 OT Individual Time Calculation (min): 53 min    Short Term Goals: Week 2:  OT Short Term Goal 1 (Week 2): STG = LTG due to LOS  Skilled Therapeutic Interventions/Progress Updates:   Met pt toileting with NT, pt agreed to session. Treatment session was focused on self-care retraining. Due to pt shakiness and weakness pt did not complete bathing in shower.  Pt was Min A for ambulating short distance from bathroom to recliner with RW. Pt was Min A for stand > sit transfer. Pt doffed UB dressing with supervision, and LB dressing with Max A. Pt required +2 for pulling up LB dressing over hips. Pt expressed "all my strength is gone" and needing Max A for sit > stand transfers. Pt was supervision for UB bathing and Min A for LB bathing, requiring assistance for bathing bottom. Pt donned UB dressing with supervision and LB dressing with Max A due to pt weakness. Pt required multiple breaks when donning LB dressing. Pt was Min A for donning footwear, needing assistance with ted hose. OTS washed pt's hair while sitting in recliner and combed hair. Left pt sitting in recliner, chair alarm on, all needs within reach.   RN and MD notified of pt weakness and shakiness throughout session.   Therapy Documentation Precautions:  Precautions Precautions: Fall Precaution Comments: crani Restrictions Weight Bearing Restrictions: No   Pain: Pain Assessment Pain Scale: 0-10 Pain Score: 0-No pain Faces Pain Scale: No hurt ADL: ADL Eating: Not assessed Where Assessed-Eating: Edge of bed Grooming: Minimal assistance (OTS washed pt hair and combed hair, pt able to apply deodorant) Where Assessed-Grooming: Chair Upper Body Bathing: Supervision/safety Where Assessed-Upper Body Bathing: Chair Lower Body Bathing: Supervision/safety Where  Assessed-Lower Body Bathing: Chair Upper Body Dressing: Supervision/safety Where Assessed-Upper Body Dressing: Chair Lower Body Dressing: Maximal assistance Where Assessed-Lower Body Dressing: Chair Toileting: Not assessed Where Assessed-Toileting: Glass blower/designer: Not assessed Armed forces technical officer Method: Counselling psychologist: Radiographer, therapeutic: Not assessed Social research officer, government: Not assessed Social research officer, government Method: Radiographer, therapeutic: Shower seat with back, Grab bars   Therapy/Group: Individual Therapy  Casin Federici 09/20/2020, 10:14 AM

## 2020-09-20 NOTE — Progress Notes (Signed)
Occupational Therapy Session Note  Patient Details  Name: Gabriel [name] MRN: [id number] Date of Birth: 04/10/1930  Today's Date: 09/20/2020 OT Individual Time: 3094-0768 OT Individual Time Calculation (min): 23 min (missed 37 mins due to fatigue)   Short Term Goals: Week 2:  OT Short Term Goal 1 (Week 2): STG = LTG due to LOS  Skilled Therapeutic Interventions/Progress Updates:   Limited treatment session secondary to fatigue and pt initially asleep upon arrival.  Pt's wife present and requesting pt to rest as he had been quite fatigued and had multiple medical issues/procedures today.  Therapist discussed with pt's wife his limitations during AM session secondary to fatigue, instability, and decreased endurance.  Pt waking during discussion and reiterated feeling that he does not have any injury.  Discussed IV fluids and continued recommendation for fluid intake.  Discussed palliative care services to discuss diagnosis and goals of care with pt and pt's wife receptive of education and further discussion.  Pt declined any activity, still stating fatigue but appreciative of therapist's time and interventions.  Pt remained semi-reclined in bed with all needs in reach.  Pt missed 37 mins due to fatigue.    Therapy Documentation Precautions:  Precautions Precautions: Fall Precaution Comments: crani Restrictions Weight Bearing Restrictions: No General: General OT Amount of Missed Time: 37 Minutes Vital Signs: Therapy Vitals Temp: 97.9 F (36.6 C) Temp Source: Oral Pulse Rate: 88 Resp: 16 BP: 94/63 Patient Position (if appropriate): Lying Oxygen Therapy SpO2: 97 % O2 Device: Room Air Pain:  Pt with no c/o pain    Therapy/Group: Individual Therapy  Simonne Come 09/20/2020, 2:19 PM

## 2020-09-20 NOTE — Progress Notes (Addendum)
PROGRESS NOTE   Subjective/Complaints: No complaints this morning As per therapy he has complained of increased weakness during his sessions and required greater assist this morning. UA negative- additional labwork and head CT ordered  ROS:  Pt denies SOB, abd pain, CP, N/V/C/D, and vision changes, pain, +diffuse weakness.    Objective:   No results found. Recent Labs    09/20/20 0850  WBC 6.5  HGB 10.7*  HCT 33.0*  PLT 152     No results for input(s): NA, K, CL, CO2, GLUCOSE, BUN, CREATININE, CALCIUM in the last 72 hours.   Intake/Output Summary (Last 24 hours) at 09/20/2020 0933 Last data filed at 09/20/2020 0714 Gross per 24 hour  Intake 498 ml  Output 750 ml  Net -252 ml        Physical Exam: Vital Signs Blood pressure 101/66, pulse 73, temperature 97.9 F (36.6 C), temperature source Oral, resp. rate 14, weight 83.9 kg, SpO2 99 %. Gen: no distress, normal appearing HEENT: oral mucosa pink and moist, NCAT Cardio: Reg rate Chest: normal effort, normal rate of breathing Abd: soft, non-distended Ext: no edema Psych: pleasant, normal affect  Musculoskeletal:     Cervical back: Normal range of motion and neck supple.     Comments: RUE- 5/5 except finger abd 5-/5 LUE- 5/5 except finger abd 4+/5 RLE- HF 4+/5, KE 4/5, DF and PF 4-/5 LLE- HF 4-/5, KE 4-/5, DF 2-/5 and PF 2-/5 --no changes Skin:    General: Skin is warm and dry. Crani incision CDI with staples  Neurological:     Comments: alert. Follows basic commands. Fair insight and awareness. Can be tangential. Delayed processing L inattention noted on exam Intact to light touch in all 4 extremities-   Assessment/Plan: 1. Functional deficits which require 3+ hours per day of interdisciplinary therapy in a comprehensive inpatient rehab setting. Physiatrist is providing close team supervision and 24 hour management of active medical problems listed  below. Physiatrist and rehab team continue to assess barriers to discharge/monitor patient progress toward functional and medical goals  Care Tool:  Bathing    Body parts bathed by patient: Right arm, Left arm, Chest, Abdomen, Face, Right upper leg, Left upper leg   Body parts bathed by helper: Front perineal area, Buttocks     Bathing assist Assist Level: Minimal Assistance - Patient > 75%     Upper Body Dressing/Undressing Upper body dressing   What is the patient wearing?: Button up shirt    Upper body assist Assist Level: Supervision/Verbal cueing    Lower Body Dressing/Undressing Lower body dressing      What is the patient wearing?: Underwear/pull up, Pants     Lower body assist Assist for lower body dressing: Minimal Assistance - Patient > 75%     Toileting Toileting    Toileting assist Assist for toileting: Contact Guard/Touching assist     Transfers Chair/bed transfer  Transfers assist     Chair/bed transfer assist level: Minimal Assistance - Patient > 75%     Locomotion Ambulation   Ambulation assist      Assist level: Contact Guard/Touching assist Assistive device: Walker-rolling Max distance: 273ft   Walk 10  feet activity   Assist     Assist level: Contact Guard/Touching assist Assistive device: Walker-rolling   Walk 50 feet activity   Assist Walk 50 feet with 2 turns activity did not occur: Safety/medical concerns  Assist level: Contact Guard/Touching assist Assistive device: Walker-rolling    Walk 150 feet activity   Assist Walk 150 feet activity did not occur: Safety/medical concerns  Assist level: Contact Guard/Touching assist Assistive device: Walker-rolling    Walk 10 feet on uneven surface  activity   Assist Walk 10 feet on uneven surfaces activity did not occur: Safety/medical concerns         Wheelchair     Assist Will patient use wheelchair at discharge?: No Type of Wheelchair: Manual     Wheelchair assist level: Supervision/Verbal cueing Max wheelchair distance: 119ft    Wheelchair 50 feet with 2 turns activity    Assist        Assist Level: Supervision/Verbal cueing   Wheelchair 150 feet activity     Assist      Assist Level: Supervision/Verbal cueing   Blood pressure 101/66, pulse 73, temperature 97.9 F (36.6 C), temperature source Oral, resp. rate 14, weight 83.9 kg, SpO2 99 %.  Medical Problem List and Plan: 1.  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  Status post stereotactic right temporal craniotomy resection of brain metastasis 09/03/2020.  Follow-up medical oncology as well as radiation oncology with radiation therapy as directed.  Decadron protocol             -patient may shower once staples out             -ELOS/Goals: 12-16 days supervision  -Continue PT, OT and SLP    -Discussed palliative care consult and patient defers- prefers FULL CODE status 2.  Antithrombotics: -DVT/anticoagulation: SCDs.             -antiplatelet therapy: N/A 3. Pain Management:  N/A. Discontinue Norco  4. Situational anxiety: Discontinue Ativan 0.25- no need for scans.              -antipsychotic agents: N/A 5. Neuropsych: This patient is capable of making decisions on his own behalf. 6. Skin/Wound Care: Routine skin checks. Staples d/ced on 6/14- nursing order placed. Site is c/d/i 7. Fluids/Electrolytes/Nutrition: Routine in and outs with follow-up chemistries 8.  Seizure prophylaxis.  d/c Keppra, completed course 9.  Hypertension.  Continue Lisinopril 10 mg daily, HCTZ 12.5 mg daily.  Monitor with increased mobility 10.  Hyperlipidemia.  Continue Lipitor 11.  Constipation.  Senokot twice daily, Colace twice daily, Dulcolax suppository daily as needed. Received sorbitol once.  12. Hemorrhoids: bleeding, painless, discussed hgb stable, repeat Monday 13. Increased weakness/slowed response: Head CT normal. Labs stable. UA/UC normal. Cr worse and Magnesium  low. 14. AKI: could be contributing to worsening function: d/c HCTZ. D/c HCTZ and lisinopril. Repeat creatinine tomorrow. Fluids w/ IV mag today. 15. Hypomagnesium: supplement with 2g IV today and repeat tomorrow. Will also help with BP with stopping two anti-hypertensives.  16. Severe vitamin D deficiency: Ergocalciferol 50,000U weekly x7 weeks  13. Disposition: HFU scheduled with me on 11/22/20 at 2pm. D/c 6/22. Called wife to update her.  14. CODE STATUS: FULL: Consulted palliative care to speak directly to wife.     >36 minutes spent in evaluation of patient, updating his wife, discussed his head CT, labwork, supplementation, fatigue, disposition, wife trying to hire help, hypotension, AKI, palliative care  LOS: 10 days A FACE TO FACE EVALUATION WAS PERFORMED  Clide Deutscher Tateanna Bach 09/20/2020, 9:33 AM

## 2020-09-20 NOTE — Progress Notes (Signed)
Physical Therapy Session Note  Patient Details  Name: Gabriel [name] MRN: [id number] Date of Birth: [birth year]  Today's Date: 09/20/2020 PT Individual Time: 0900-0953 PT Individual Time Calculation (min): 53 min   Short Term Goals: Week 1:  PT Short Term Goal 1 (Week 1): Pt will ambulate 115f with CGA PT Short Term Goal 1 - Progress (Week 1): Met PT Short Term Goal 2 (Week 1): Pt will perform bed mobility with supervision assist PT Short Term Goal 2 - Progress (Week 1): Met PT Short Term Goal 3 (Week 1): Pt will improve Berg balance score to >35 to indicate improved safety with functional mobility PT Short Term Goal 3 - Progress (Week 1): Progressing toward goal PT Short Term Goal 4 (Week 1): Pt will propell WC >1551fwith supervision assist PT Short Term Goal 4 - Progress (Week 1): Met Week 2:  PT Short Term Goal 1 (Week 2): STG=LTG due to LOS  Skilled Therapeutic Interventions/Progress Updates:   Received pt being assisted back to bed with RN and NT present. Per RN, pt going down for CT soon. Pt agreeable to PT treatment and denied any pain during session but reported feeling "weak". CT staff present but offered to come back to get pt after therapy session at 10am. Pt motivated to do as much as possible during session but did appear very fatigued throughout. Session with emphasis on functional mobility/transfers, generalized strengthening, dynamic standing balance/coordination, and improved activity tolerance. Pt transferred supine<>sitting EOB from flat bed with mod A for LE management and trunk control. Pt transferred bed<>WC stand<>pivot with RW and min A with cues for hand placement when standing. [facility] purposes and performed seated BLE strengthening on Kinetron at 20 cm/sec for 1 minute x 4 trials with BUE support with emphasis on glute/quad strengthening and cardiovascular endurance. Sit<>stand at table in dayroom with min A x 2  trials and worked on dynamic standing balance copying picture on pegboard with CGA for balance. Pt required min cues for spatial orientation and breathing heavily, requested to sit after ~45 seconds due to fatigue, LE weakness, and reports of feeling "weary". [facility] and reported urge to urinate. Sit<>stand with RW and min A and doffed pants/brief with mod A. Pt able to void in urinal while standing with CGA and total A to position urinal. Sit<>supine with min A for LE management. Concluded session with pt supine in bed, needs within reach, and bed alarm on. Pt's call bell not working; RNTherapist, sportsotified.   Therapy Documentation Precautions:  Precautions Precautions: Fall Precaution Comments: crani Restrictions Weight Bearing Restrictions: No  Therapy/Group: Individual Therapy AnAlfonse AlpersT, DPT   09/20/2020, 7:24 AM

## 2020-09-21 DIAGNOSIS — Z66 Do not resuscitate: Secondary | ICD-10-CM

## 2020-09-21 DIAGNOSIS — Z7189 Other specified counseling: Secondary | ICD-10-CM

## 2020-09-21 DIAGNOSIS — Z515 Encounter for palliative care: Secondary | ICD-10-CM

## 2020-09-21 LAB — BASIC METABOLIC PANEL
Anion gap: 7 (ref 5–15)
BUN: 35 mg/dL — ABNORMAL HIGH (ref 8–23)
CO2: 26 mmol/L (ref 22–32)
Calcium: 8.4 mg/dL — ABNORMAL LOW (ref 8.9–10.3)
Chloride: 99 mmol/L (ref 98–111)
Creatinine, Ser: 1.19 mg/dL (ref 0.61–1.24)
GFR, Estimated: 59 mL/min — ABNORMAL LOW (ref >60)
Glucose, Bld: 115 mg/dL — ABNORMAL HIGH (ref 70–99)
Potassium: 4.6 mmol/L (ref 3.5–5.1)
Sodium: 132 mmol/L — ABNORMAL LOW (ref 135–145)

## 2020-09-21 LAB — MAGNESIUM: Magnesium: 2.1 mg/dL (ref 1.7–2.4)

## 2020-09-21 MED ORDER — ENSURE ENLIVE PO LIQD
237.0000 mL | Freq: Two times a day (BID) | ORAL | Status: DC
  Administered 2020-09-21 – 2020-09-23 (×3): 237 mL via ORAL

## 2020-09-21 MED ORDER — TRAZODONE HCL 50 MG PO TABS
50.0000 mg | ORAL_TABLET | Freq: Every day | ORAL | Status: DC
  Administered 2020-09-21: 50 mg via ORAL
  Filled 2020-09-21: qty 1

## 2020-09-21 NOTE — Progress Notes (Signed)
Physical Therapy Session Note  Patient Details  Name: Gabriel [name] MRN: [id number] Date of Birth: 01/25/1931  Today's Date: 09/21/2020 PT Individual Time: 0930-1009 PT Individual Time Calculation (min): 39 min   Today's Date: 09/21/2020 PT Missed Time: 21 Minutes Missed Time Reason: Other (Comment) (palliative care)  Short Term Goals: Week 1:  PT Short Term Goal 1 (Week 1): Pt will ambulate 150ft with CGA PT Short Term Goal 1 - Progress (Week 1): Met PT Short Term Goal 2 (Week 1): Pt will perform bed mobility with supervision assist PT Short Term Goal 2 - Progress (Week 1): Met PT Short Term Goal 3 (Week 1): Pt will improve Berg balance score to >35 to indicate improved safety with functional mobility PT Short Term Goal 3 - Progress (Week 1): Progressing toward goal PT Short Term Goal 4 (Week 1): Pt will propell WC >150ft with supervision assist PT Short Term Goal 4 - Progress (Week 1): Met Week 2:  PT Short Term Goal 1 (Week 2): STG=LTG due to LOS  Skilled Therapeutic Interventions/Progress Updates:   Received pt semi-reclined in bed with wife present at bedside speaking with Palliative care and politely requesting therapist return shortly. Therapist returned 15 minutes later and pt finishing with Palliative care and agreeable to PT treatment, and denied any pain during session. MD present for morning rounds. Session with emphasis on functional mobility/transfers, generalized strengthening, dynamic standing balance/coordination, standing tolerance, and improved activity tolerance. Pt transferred semi-reclined<>sitting EOB with supervision and use of bedrails with increased time and donned socks and shoes with max A for time management purposes. Pt scooted to EOB with mod A and cues for reciprocal scooting technique. Sit<>stand with RW and mod A from elevated bed initially. Pt returned to sitting immediately due to legs feeling like "jelly". Sit<>stand trial 2 with min A and pt able to stand  for ~30 seconds prior to sitting. Pt then stood for 3rd time and transferred bed<>WC with RW and min A. Educated pt and wife that pt tends to require more assist in the morning before he is completly awake and potentially due to being in bed for prolonged amounts of time. Concluded session with pt sitting in WC, needs within reach, and seatbelt alarm on. Wife present at bedside. 21 minutes missed of skilled physical therapy.  Therapy Documentation Precautions:  Precautions Precautions: Fall Precaution Comments: crani Restrictions Weight Bearing Restrictions: No  Therapy/Group: Individual Therapy Anna M Johnson Anna Johnson PT, DPT   09/21/2020, 7:23 AM  

## 2020-09-21 NOTE — Progress Notes (Signed)
PROGRESS NOTE   Subjective/Complaints: Poor sleep per RN last noc, also not eating well Speaking with palliative nurse this am , decided for DNR,  Pt feels overall weak Wife at bedside , son on the phone ROS:  Pt denies SOB, abd pain, CP, N/V/C/D, and vision changes, pain, +diffuse weakness.    Objective:   CT HEAD WO CONTRAST  Result Date: 09/20/2020 CLINICAL DATA:  Mental status change, history of metastatic disease EXAM: CT HEAD WITHOUT CONTRAST TECHNIQUE: Contiguous axial images were obtained from the base of the skull through the vertex without intravenous contrast. COMPARISON:  Correlation made with MRI brain 09/04/2020 FINDINGS: Brain: There is no acute intracranial hemorrhage or significant mass effect. No acute appearing loss of gray-white differentiation. Right temporal resection cavity is noted. Thin extra-axial collection containing fluid and air remains present. Ventral extra-axial air as resolved. Surrounding hypoattenuation extending into the frontoparietal lobes is similar in extent to signal abnormality on MRI. Left inferior frontal and left cerebellar lesions on MRI are not visible by CT. Additional patchy low-attenuation in the supratentorial white matter is nonspecific but probably reflects chronic microvascular and/or treatment related changes. Prominence of the ventricles and sulci reflects cerebral and cerebellar parenchymal volume loss. No hydrocephalus. Vascular: There is atherosclerotic calcification at the skull base. Skull: Right craniotomy.  Otherwise unremarkable. Sinuses/Orbits: No acute finding. Other: None. IMPRESSION: No acute intracranial hemorrhage or evidence of acute infarction. No significant mass effect. Expected evolution of recent postoperative changes. Electronically Signed   By: Macy Mis M.D.   On: 09/20/2020 11:31   Recent Labs    09/20/20 0850  WBC 6.5  HGB 10.7*  HCT 33.0*  PLT 152       Recent Labs    09/20/20 0850 09/21/20 0619  NA 133* 132*  K 4.0 4.6  CL 99 99  CO2 23 26  GLUCOSE 121* 115*  BUN 38* 35*  CREATININE 1.43* 1.19  CALCIUM 8.8* 8.4*     Intake/Output Summary (Last 24 hours) at 09/21/2020 0932 Last data filed at 09/21/2020 0739 Gross per 24 hour  Intake 158.33 ml  Output 450 ml  Net -291.67 ml         Physical Exam: Vital Signs Blood pressure 100/64, pulse 71, temperature 97.7 F (36.5 C), temperature source Oral, resp. rate 16, weight 83.9 kg, SpO2 98 %.  General: No acute distress Mood and affect are appropriate Heart: Regular rate and rhythm no rubs murmurs or extra sounds Lungs: Clear to auscultation, breathing unlabored, no rales or wheezes Abdomen: Positive bowel sounds, soft nontender to palpation, nondistended Extremities: No clubbing, cyanosis, or edema Skin: No evidence of breakdown, no evidence of rash    Musculoskeletal:     Cervical back: Normal range of motion and neck supple.     Comments: RUE- 5/5 except finger abd 5-/5 LUE- 5/5 except finger abd 4+/5 RLE- HF 4+/5, KE 4/5, DF and PF 4-/5 LLE- HF 4-/5, KE 4-/5, DF 2-/5 and PF 2-/5 --no changes Skin:    General: Skin is warm and dry. Crani incision CDI with staples  Neurological:     Comments: alert. Follows basic commands. Fair insight and awareness. Can be tangential.  Delayed processing L inattention noted on exam Intact to light touch in all 4 extremities-   Assessment/Plan: 1. Functional deficits which require 3+ hours per day of interdisciplinary therapy in a comprehensive inpatient rehab setting. Physiatrist is providing close team supervision and 24 hour management of active medical problems listed below. Physiatrist and rehab team continue to assess barriers to discharge/monitor patient progress toward functional and medical goals  Care Tool:  Bathing    Body parts bathed by patient: Right arm, Left arm, Chest, Abdomen, Front perineal area,  Right upper leg, Left upper leg, Right lower leg, Left lower leg   Body parts bathed by helper: Buttocks     Bathing assist Assist Level: Minimal Assistance - Patient > 75%     Upper Body Dressing/Undressing Upper body dressing   What is the patient wearing?: Button up shirt    Upper body assist Assist Level: Supervision/Verbal cueing    Lower Body Dressing/Undressing Lower body dressing      What is the patient wearing?: Underwear/pull up, Pants     Lower body assist Assist for lower body dressing: Maximal Assistance - Patient 25 - 49%     Toileting Toileting    Toileting assist Assist for toileting: Minimal Assistance - Patient > 75%     Transfers Chair/bed transfer  Transfers assist     Chair/bed transfer assist level: Maximal Assistance - Patient 25 - 49%     Locomotion Ambulation   Ambulation assist      Assist level: Contact Guard/Touching assist Assistive device: Walker-rolling Max distance: 239ft   Walk 10 feet activity   Assist     Assist level: Contact Guard/Touching assist Assistive device: Walker-rolling   Walk 50 feet activity   Assist Walk 50 feet with 2 turns activity did not occur: Safety/medical concerns  Assist level: Contact Guard/Touching assist Assistive device: Walker-rolling    Walk 150 feet activity   Assist Walk 150 feet activity did not occur: Safety/medical concerns  Assist level: Contact Guard/Touching assist Assistive device: Walker-rolling    Walk 10 feet on uneven surface  activity   Assist Walk 10 feet on uneven surfaces activity did not occur: Safety/medical concerns         Wheelchair     Assist Will patient use wheelchair at discharge?: No Type of Wheelchair: Manual    Wheelchair assist level: Supervision/Verbal cueing Max wheelchair distance: 137ft    Wheelchair 50 feet with 2 turns activity    Assist        Assist Level: Supervision/Verbal cueing   Wheelchair 150 feet  activity     Assist      Assist Level: Supervision/Verbal cueing   Blood pressure 100/64, pulse 71, temperature 97.7 F (36.5 C), temperature source Oral, resp. rate 16, weight 83.9 kg, SpO2 98 %.  Medical Problem List and Plan: 1.  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  Status post stereotactic right temporal craniotomy resection of brain metastasis 09/03/2020. Has small 79mm Right frontal mass as well  Follow-up medical oncology as well as radiation oncology with radiation therapy as directed.  Decadron protocol             -patient may shower once staples out             -ELOS/Goals: 12-16 days supervision  -Continue PT, OT and SLP    -Discussed palliative care consult and patient defers- prefers FULL CODE status 2.  Antithrombotics: -DVT/anticoagulation: SCDs.             -  antiplatelet therapy: N/A 3. Pain Management:  N/A. Discontinue Norco  4. Situational anxiety: Discontinue Ativan 0.25- no need for scans.              -antipsychotic agents: N/A 5. Neuropsych: This patient is capable of making decisions on his own behalf. 6. Skin/Wound Care: Routine skin checks. Staples d/ced on 6/14- nursing order placed. Site is c/d/i 7. Fluids/Electrolytes/Nutrition: Routine in and outs with follow-up chemistries 8.  Seizure prophylaxis.  d/c Keppra, completed course 9.  Hypertension.  Continue Lisinopril 10 mg daily, HCTZ 12.5 mg daily.  Monitor with increased mobility 10.  Hyperlipidemia.  Continue Lipitor Vitals:   09/20/20 1953 09/21/20 0517  BP: (!) 84/57 100/64  Pulse: 82 71  Resp:    Temp: 98.1 F (36.7 C) 97.7 F (36.5 C)  SpO2: 100% 98%   Soft but BP meds jusstopped 11.  Constipation.  Senokot twice daily, Colace twice daily, Dulcolax suppository daily as needed. Received sorbitol once.  12. Hemorrhoids: bleeding, painless, discussed hgb stable, repeat Monday 13. Increased weakness/slowed response: Head CT normal. Labs stable. UA/UC normal. Cr better today Magnesium  normal 14. AKI: could be contributing to worsening function: d/c HCTZ. D/c HCTZ and lisinopril. Repeat creatinine improved 15. Hypomagnesium: supplement with 2g IV 6/18  Will also help with BP with stopping two anti-hypertensives.  16. Severe vitamin D deficiency: Ergocalciferol 50,000U weekly x7 weeks  13. Disposition: HFU scheduled with me on 11/22/20 at 2pm. D/c 6/22. Called wife to update her.  14. CODE STATUS: DNR: palliative care involved, recommend cont palliative care as OP   15.  Insomnia- trazodone trial  LOS: 11 days A FACE TO FACE EVALUATION WAS PERFORMED  Charlett Blake 09/21/2020, 9:32 AM

## 2020-09-21 NOTE — Consult Note (Signed)
Palliative Medicine Inpatient Consult Note  Reason for consult:  Goals of Care  HPI:  Per intake H&P -->  HPI: Gabriel [name] is a 87 year old right-handed male with history of hyperlipidemia, hypertension, CVA with unsteady gait maintained on Plavix.  Per chart review patient lives with spouse.  Independent with assistive device.  1 [name] home 2 steps to entry.  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  CT scan revealed left upper lobe mass 7.8 x 6.8 cm in size.  A small left hilar node was also identified.  Additionally there was renal masses concerning for primary renal disease versus metastatic disease.  MRI of the brain demonstrated 3 intracranial metastases including 2 small subcentimeter metastases and a larger right hemorrhagic lesion.  Patient was established with medical oncology Dr. Mohammed/radiation oncology Dr. Lisbeth Renshaw.  PET scan completed showing hypermetabolic lingular mass with central necrosis consistent with primary bronchogenic carcinoma.  No evidence of mediastinal nodal metastasis.  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  Patient was admitted 09/03/2020 and underwent stereotactic right temporal craniotomy for resection of brain metastasis per Dr. Kathyrn Sheriff.  Surgical pathology report consistent with metastatic melanoma.  Maintained on Keppra for seizure prophylaxis.  Decadron protocol as indicated.  Medical oncology as well as radiation oncology continue to follow with radiation therapy as directed.  Tolerating a regular consistency diet.  No current plan at this time for DVT prophylaxis due to some bleeding hemorrhoids.  Therapy evaluations completed due to patient decreased functional mobility patient was admitted for a comprehensive rehab program where he has been for the past ten days. Plan for discharge midweek. Palliative care has been requested to further  discuss goals of care in the setting of metastatic disease.   Clinical Assessment/Goals of Care:  *Please note that this is a verbal dictation therefore any spelling or grammatical errors are due to the "[place] [facility]" system interpretation.  I have reviewed medical records including EPIC notes, labs and imaging, received report from bedside RN, assessed the patient who was sitting up in bed in NAD.    I met with [name] and his spouse, Gabriel [name] to further discuss diagnosis prognosis, GOC, EOL wishes, disposition and options.   I introduced Palliative Medicine as specialized medical care for people living with serious illness. It focuses on providing relief from the symptoms and stress of a serious illness. The goal is to improve quality of life for both the patient and the family.  Trooper Olander" is from [place], [place] originally. He now lives in [place]. He has been married to his spouse, Gabriel [name] for the past fifty-four years. They share a son and a daughter together. 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 He is a man who loves the outdoors, sailing, animale, and travel. He is a man of faith and practices within the [facility] denomination.  Prior to hospitalization Gabriel [name] had been living at home with his wife and was able to participate in bADL.s.  A brief review of Gabriel [name]'s PMH was completed:  hypertension, TIA, and now active Stg IV melanoma. He has been in acute rehabilitation for the past ten days as per his wife they are hoping he can regain strength and live as long as his body will allow with quality in his life. He has lived a remarkable and fruitful life.  A detailed discussion was had today regarding advanced directives - these are  completed and Gabriel [name] will bring then in tomorrow to be scanned into our EMR.    Concepts specific to code status, artifical feeding and hydration, continued IV antibiotics and rehospitalization was had.  A MOST form was introduced  and completed as below:  Cardiopulmonary Resuscitation: Do Not Attempt Resuscitation (DNR/No CPR)  Medical Interventions: Limited Additional Interventions: Use medical treatment, IV fluids and cardiac monitoring as indicated, DO NOT USE intubation or mechanical ventilation. May consider use of less invasive airway support such as BiPAP or CPAP. Also provide comfort measures. Transfer to the hospital if indicated. Avoid intensive care.   Antibiotics: Antibiotics if needed  IV Fluids: IV fluids for a defined trial period  Feeding Tube: No feeding tube   The difference between a aggressive medical intervention path  and a palliative comfort care path for this patient at this time was had. We reviewed that if Gabriel [name] declines it is reasonable to request hospice. Discussed setting him up with an OP Palliative Care team to further aid in supporting his needs in the home.  Gabriel [name] would like to continue with the present course with the goal of remaining strong enough for immunotherapy.   Discussed the importance of continued conversation with family and their  medical providers regarding overall plan of care and treatment options, ensuring decisions are within the context of the patients values and GOCs.  Decision Maker: Gabriel [name] (spouse) [phone]  SUMMARY OF RECOMMENDATIONS   DNAR/DNI  MOST Completed, paper copy placed onto the chart electric copy can be found in [facility]  DNR Form Completed, paper copy placed onto the chart electric copy can be found in [facility]  Will Obtain living will to scan into [facility]  TOC OP Palliative Care  Ongoing support incrementally  Code Status/Advance Care Planning: DNAR/DNI   Palliative Prophylaxis:  Oral Care, Mobility  Additional Recommendations (Limitations, Scope, Preferences): Treat what is treatable   Psycho-social/Spiritual:  Desire for further Chaplaincy support: No Additional Recommendations: Education on chronic diseases   Prognosis:  Limited in the setting of metastatic disease, hypoalbuminemia, frailty. Has a high 12 month mortality risk.   Discharge Planning: Discharge to home with [facility] when medically optimized.  Vitals:   09/20/20 1953 09/21/20 0517  BP: (!) 84/57 100/64  Pulse: 82 71  Resp:    Temp: 98.1 F (36.7 C) 97.7 F (36.5 C)  SpO2: 100% 98%    Intake/Output Summary (Last 24 hours) at 09/21/2020 0646 Last data filed at 09/21/2020 0515 Gross per 24 hour  Intake 361.33 ml  Output 450 ml  Net -88.67 ml   Last Weight  Most recent update: 09/10/2020 11:12 PM    Weight  83.9 kg (184 lb 15.5 oz)            Gen:  Elderly M in NAD HEENT: moist mucous membranes CV: Regular rate and rhythm, no murmurs rubs or gallops PULM: clear to auscultation bilaterally  ABD: soft/nontender/  EXT: No edema  Neuro: Alert and oriented x3   PPS: 50%   This conversation/these recommendations were discussed with patient primary care team, Dr. Letta Pate  Time In: 1300 Time Out: 1410 Total Time: 70 Greater than 50%  of this time was spent counseling and coordinating care related to the above assessment and plan.  [place] Team Team Cell Phone: [phone] Please utilize secure chat with additional questions, if there is no response within 30 minutes please call the above phone number  Palliative Medicine Team providers are available by  phone from 7am to 7pm daily and can be reached through the team cell phone.  Should this patient require assistance outside of these hours, please call the patient's attending physician.

## 2020-09-22 MED ORDER — ZOLPIDEM TARTRATE 5 MG PO TABS
5.0000 mg | ORAL_TABLET | Freq: Every evening | ORAL | Status: DC | PRN
  Administered 2020-09-22: 5 mg via ORAL
  Filled 2020-09-22: qty 1

## 2020-09-22 MED ORDER — SIMETHICONE 80 MG PO CHEW
80.0000 mg | CHEWABLE_TABLET | ORAL | Status: DC | PRN
  Administered 2020-09-22: 80 mg via ORAL
  Filled 2020-09-22 (×3): qty 1

## 2020-09-22 NOTE — Progress Notes (Signed)
PROGRESS NOTE   Subjective/Complaints: Per nursing patient slept poorly last night did receive 50 mg of trazodone.  Patient did not know why he did not sleep well no issues with pain. ROS:  Pt denies SOB, abd pain, CP, N/V/C/D, and vision changes, pain, +diffuse weakness.    Objective:   CT HEAD WO CONTRAST  Result Date: 09/20/2020 CLINICAL DATA:  Mental status change, history of metastatic disease EXAM: CT HEAD WITHOUT CONTRAST TECHNIQUE: Contiguous axial images were obtained from the base of the skull through the vertex without intravenous contrast. COMPARISON:  Correlation made with MRI brain 09/04/2020 FINDINGS: Brain: There is no acute intracranial hemorrhage or significant mass effect. No acute appearing loss of gray-white differentiation. Right temporal resection cavity is noted. Thin extra-axial collection containing fluid and air remains present. Ventral extra-axial air as resolved. Surrounding hypoattenuation extending into the frontoparietal lobes is similar in extent to signal abnormality on MRI. Left inferior frontal and left cerebellar lesions on MRI are not visible by CT. Additional patchy low-attenuation in the supratentorial white matter is nonspecific but probably reflects chronic microvascular and/or treatment related changes. Prominence of the ventricles and sulci reflects cerebral and cerebellar parenchymal volume loss. No hydrocephalus. Vascular: There is atherosclerotic calcification at the skull base. Skull: Right craniotomy.  Otherwise unremarkable. Sinuses/Orbits: No acute finding. Other: None. IMPRESSION: No acute intracranial hemorrhage or evidence of acute infarction. No significant mass effect. Expected evolution of recent postoperative changes. Electronically Signed   By: Macy Mis M.D.   On: 09/20/2020 11:31   Recent Labs    09/20/20 0850  WBC 6.5  HGB 10.7*  HCT 33.0*  PLT 152      Recent Labs     09/20/20 0850 09/21/20 0619  NA 133* 132*  K 4.0 4.6  CL 99 99  CO2 23 26  GLUCOSE 121* 115*  BUN 38* 35*  CREATININE 1.43* 1.19  CALCIUM 8.8* 8.4*      Intake/Output Summary (Last 24 hours) at 09/22/2020 0858 Last data filed at 09/22/2020 0503 Gross per 24 hour  Intake 400 ml  Output 950 ml  Net -550 ml         Physical Exam: Vital Signs Blood pressure 111/71, pulse 70, temperature 97.6 F (36.4 C), resp. rate 17, weight 83.9 kg, SpO2 98 %.  General: No acute distress Mood and affect are appropriate Heart: Regular rate and rhythm no rubs murmurs or extra sounds Lungs: Clear to auscultation, breathing unlabored, no rales or wheezes Abdomen: Positive bowel sounds, soft nontender to palpation, nondistended Extremities: No clubbing, cyanosis, or edema Skin: No evidence of breakdown, no evidence of rash, right scalp incision clean dry and intact  Musculoskeletal:     Cervical back: Normal range of motion and neck supple.     Comments: RUE- 5/5 except finger abd 5-/5 LUE- 5/5 except finger abd 4+/5 RLE- HF 4+/5, KE 4/5, DF and PF 4-/5 LLE- HF 4-/5, KE 4-/5, DF 3-/5 and PF 3-/5 --mildly improved ankle motion Skin:    General: Skin is warm and dry. Crani incision CDI with staples  Neurological:     Comments: alert. Follows basic commands. Fair insight and awareness. Can be tangential.  Delayed processing L inattention noted on exam Intact to light touch in all 4 extremities-   Assessment/Plan: 1. Functional deficits which require 3+ hours per day of interdisciplinary therapy in a comprehensive inpatient rehab setting. Physiatrist is providing close team supervision and 24 hour management of active medical problems listed below. Physiatrist and rehab team continue to assess barriers to discharge/monitor patient progress toward functional and medical goals  Care Tool:  Bathing    Body parts bathed by patient: Right arm, Left arm, Chest, Abdomen, Front perineal area,  Right upper leg, Left upper leg, Right lower leg, Left lower leg   Body parts bathed by helper: Buttocks     Bathing assist Assist Level: Minimal Assistance - Patient > 75%     Upper Body Dressing/Undressing Upper body dressing   What is the patient wearing?: Button up shirt    Upper body assist Assist Level: Supervision/Verbal cueing    Lower Body Dressing/Undressing Lower body dressing      What is the patient wearing?: Underwear/pull up, Pants     Lower body assist Assist for lower body dressing: Maximal Assistance - Patient 25 - 49%     Toileting Toileting    Toileting assist Assist for toileting: Minimal Assistance - Patient > 75%     Transfers Chair/bed transfer  Transfers assist     Chair/bed transfer assist level: Maximal Assistance - Patient 25 - 49%     Locomotion Ambulation   Ambulation assist      Assist level: Contact Guard/Touching assist Assistive device: Walker-rolling Max distance: 262ft   Walk 10 feet activity   Assist     Assist level: Contact Guard/Touching assist Assistive device: Walker-rolling   Walk 50 feet activity   Assist Walk 50 feet with 2 turns activity did not occur: Safety/medical concerns  Assist level: Contact Guard/Touching assist Assistive device: Walker-rolling    Walk 150 feet activity   Assist Walk 150 feet activity did not occur: Safety/medical concerns  Assist level: Contact Guard/Touching assist Assistive device: Walker-rolling    Walk 10 feet on uneven surface  activity   Assist Walk 10 feet on uneven surfaces activity did not occur: Safety/medical concerns         Wheelchair     Assist Will patient use wheelchair at discharge?: No Type of Wheelchair: Manual    Wheelchair assist level: Supervision/Verbal cueing Max wheelchair distance: 158ft    Wheelchair 50 feet with 2 turns activity    Assist        Assist Level: Supervision/Verbal cueing   Wheelchair 150 feet  activity     Assist      Assist Level: Supervision/Verbal cueing   Blood pressure 111/71, pulse 70, temperature 97.6 F (36.4 C), resp. rate 17, weight 83.9 kg, SpO2 98 %.  Medical Problem List and Plan: 1.  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  Status post stereotactic right temporal craniotomy resection of brain metastasis 09/03/2020. Has small 34mm Right frontal mass as well  Follow-up medical oncology as well as radiation oncology with radiation therapy as directed.  Decadron protocol             -patient may shower once staples out             -ELOS/Goals: 12-16 days supervision  -Continue PT, OT and SLP    -After discussion with palliative care patient and wife have decided on DNR status 2.  Antithrombotics: -DVT/anticoagulation: SCDs.             -  antiplatelet therapy: N/A 3. Pain Management:  N/A. Discontinue Norco  4. Situational anxiety: Discontinue Ativan 0.25- no need for scans.              -antipsychotic agents: N/A 5. Neuropsych: This patient is capable of making decisions on his own behalf. 6. Skin/Wound Care: Routine skin checks. Staples d/ced on 6/14- nursing order placed. Site is c/d/i 7. Fluids/Electrolytes/Nutrition: Routine in and outs with follow-up chemistries 8.  Seizure prophylaxis.  d/c Keppra, completed course 9.  Hypertension.  Continue Lisinopril 10 mg daily, HCTZ 12.5 mg daily.  Monitor with increased mobility 10.  Hyperlipidemia.  Continue Lipitor Vitals:   09/21/20 1931 09/22/20 0500  BP: 110/69 111/71  Pulse: 75 70  Resp: 17 17  Temp: 98.5 F (36.9 C) 97.6 F (36.4 C)  SpO2: 96% 98%   Soft but BP meds jusstopped 11.  Constipation.  Senokot twice daily, Colace twice daily, Dulcolax suppository daily as needed. Received sorbitol once.  12. Hemorrhoids: bleeding, painless, discussed hgb stable, repeat Monday 13. Increased weakness/slowed response: Head CT normal. Labs stable. UA/UC normal. Cr better today Magnesium normal 14. AKI: could  be contributing to worsening function: d/c HCTZ. D/c HCTZ and lisinopril. Repeat creatinine improved 15. Hypomagnesium: supplement with 2g IV 6/18  Will also help with BP with stopping two anti-hypertensives.  16. Severe vitamin D deficiency: Ergocalciferol 50,000U weekly x7 weeks  13. Disposition: HFU scheduled with me on 11/22/20 at 2pm. D/c 6/22. Called wife to update her.  14. CODE STATUS: DNR: palliative care involved, recommend cont palliative care as OP   15.  Insomnia- trazodone trial was not helpful, will try 5 mg of Ambien nightly as needed  LOS: 12 days A FACE TO FACE EVALUATION WAS PERFORMED  Charlett Blake 09/22/2020, 8:58 AM

## 2020-09-22 NOTE — Progress Notes (Signed)
Palliative Medicine Inpatient Follow Up Note  Reason for consult:  Goals of Care   HPI:  Per intake H&P -->  HPI: Gabriel [name] is a 87 year old right-handed male with history of hyperlipidemia, hypertension, CVA with unsteady gait maintained on Plavix.  Per chart review patient lives with spouse.  Independent with assistive device.  1 level home 2 steps to entry.  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  CT scan revealed left upper lobe mass 7.8 x 6.8 cm in size.  A small left hilar node was also identified.  Additionally there was renal masses concerning for primary renal disease versus metastatic disease.  MRI of the brain demonstrated 3 intracranial metastases including 2 small subcentimeter metastases and a larger right hemorrhagic lesion.  Patient was established with medical oncology Dr. Mohammed/radiation oncology Dr. Lisbeth Renshaw.  PET scan completed showing hypermetabolic lingular mass with central necrosis consistent with primary bronchogenic carcinoma.  No evidence of mediastinal nodal metastasis.  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  Patient was admitted 09/03/2020 and underwent stereotactic right temporal craniotomy for resection of brain metastasis per Dr. Kathyrn Sheriff.  Surgical pathology report consistent with metastatic melanoma.  Maintained on Keppra for seizure prophylaxis.  Decadron protocol as indicated.  Medical oncology as well as radiation oncology continue to follow with radiation therapy as directed.  Tolerating a regular consistency diet.  No current plan at this time for DVT prophylaxis due to some bleeding hemorrhoids.  Therapy evaluations completed due to patient decreased functional mobility patient was admitted for a comprehensive rehab program where he has been for the past ten days. Plan for discharge midweek. Palliative care has been requested to  further discuss goals of care in the setting of metastatic disease.   Today's Discussion (09/22/2020):  *Please note that this is a verbal dictation therefore any spelling or grammatical errors are due to the "[place] One" system interpretation.  Chart reviewed. I met at bedside with Gabriel [name] and his spouse, [name]. We reviewed how his last twenty-four hours had been. Overall he endorses feeling well. He was eating a piece of cheesecake and endorses a good appetite.   [name] expresses some worries in regard to whether or now Gabriel [name] is ready to go home. She shares that Gabriel [name] remains terribly weak at this time discussed the plan for education from the PT/OT teams tomorrow as well as meeting with social work who can help arrange DME for in the home.   [name] plans to bring in patients advance directives tomorrow.   Questions and concerns addressed - reassurance provided.  Objective Assessment: Vital Signs Vitals:   09/21/20 1931 09/22/20 0500  BP: 110/69 111/71  Pulse: 75 70  Resp: 17 17  Temp: 98.5 F (36.9 C) 97.6 F (36.4 C)  SpO2: 96% 98%    Intake/Output Summary (Last 24 hours) at 09/22/2020 1334 Last data filed at 09/22/2020 0503 Gross per 24 hour  Intake 80 ml  Output 700 ml  Net -620 ml   Last Weight  Most recent update: 09/10/2020 11:12 PM    Weight  83.9 kg (184 lb 15.5 oz)            Gen:  Elderly M in NAD HEENT: moist mucous membranes CV: Regular rate and rhythm, no murmurs rubs or gallops PULM: clear to auscultation bilaterally ABD: soft/nontender/ EXT: No edema Neuro: Alert and oriented x3  SUMMARY OF RECOMMENDATIONS  DNAR/DNI   MOST Completed, paper copy placed onto the chart electric copy can be found in [place]   DNR Form Completed, paper copy placed onto the chart electric copy can be found in [place] living will to scan into [facility]   TOC OP Palliative Care   Ongoing support incrementally  Time Spent: 25 Greater than 50% of the  time was spent in counseling and coordination of care ______________________________________________________________________________________ [facility] Team Team Cell Phone: [phone] Please utilize secure chat with additional questions, if there is no response within 30 minutes please call the above phone number  Palliative Medicine Team providers are available by phone from 7am to 7pm daily and can be reached through the team cell phone.  Should this patient require assistance outside of these hours, please call the patient's attending physician.

## 2020-09-23 ENCOUNTER — Ambulatory Visit: Payer: Medicare Other | Admitting: Internal Medicine

## 2020-09-23 DIAGNOSIS — Z515 Encounter for palliative care: Secondary | ICD-10-CM

## 2020-09-23 MED ORDER — TRAZODONE HCL 100 MG PO TABS
100.0000 mg | ORAL_TABLET | Freq: Every evening | ORAL | Status: DC | PRN
  Administered 2020-09-24 – 2020-09-26 (×3): 100 mg via ORAL
  Filled 2020-09-23 (×3): qty 1

## 2020-09-23 NOTE — Progress Notes (Signed)
PROGRESS NOTE   Subjective/Complaints: Per nursing patient slept poorly last night did receive 50 mg of trazodone.  Patient did not know why he did not sleep well no issues with pain. Had gas last night- may have been from ensure- d/ced and feeling better  ROS:  Pt denies SOB, abd pain, CP, N/V/C/D, and vision changes, pain, +diffuse weakness. +gas   Objective:   No results found. No results for input(s): WBC, HGB, HCT, PLT in the last 72 hours.   Recent Labs    09/21/20 0619  NA 132*  K 4.6  CL 99  CO2 26  GLUCOSE 115*  BUN 35*  CREATININE 1.19  CALCIUM 8.4*     Intake/Output Summary (Last 24 hours) at 09/23/2020 1437 Last data filed at 09/23/2020 0730 Gross per 24 hour  Intake 220 ml  Output --  Net 220 ml        Physical Exam: Vital Signs Blood pressure 107/72, pulse 81, temperature 98.2 F (36.8 C), resp. rate 20, weight 83.9 kg, SpO2 100 %. Gen: no distress, normal appearing HEENT: oral mucosa pink and moist, NCAT Cardio: Reg rate Chest: normal effort, normal rate of breathing Abd: soft, non-distended Ext: no edema Psych: pleasant, normal affect  Musculoskeletal:     Cervical back: Normal range of motion and neck supple.     Comments: RUE- 5/5 except finger abd 5-/5 LUE- 5/5 except finger abd 4+/5 RLE- HF 4+/5, KE 4/5, DF and PF 4-/5 LLE- HF 4-/5, KE 4-/5, DF 3-/5 and PF 3-/5 --mildly improved ankle motion Skin:    General: Skin is warm and dry. Crani incision CDI with staples  Neurological:     Comments: alert. Follows basic commands. Fair insight and awareness. Can be tangential. Delayed processing L inattention noted on exam Intact to light touch in all 4 extremities-   Assessment/Plan: 1. Functional deficits which require 3+ hours per day of interdisciplinary therapy in a comprehensive inpatient rehab setting. Physiatrist is providing close team supervision and 24 hour management of  active medical problems listed below. Physiatrist and rehab team continue to assess barriers to discharge/monitor patient progress toward functional and medical goals  Care Tool:  Bathing    Body parts bathed by patient: Right arm, Left arm, Chest, Abdomen, Front perineal area   Body parts bathed by helper: Buttocks Body parts n/a: Right upper leg, Left upper leg, Right lower leg, Left lower leg (Did not complete due to time constraint)   Bathing assist Assist Level: Minimal Assistance - Patient > 75%     Upper Body Dressing/Undressing Upper body dressing   What is the patient wearing?: Pull over shirt    Upper body assist Assist Level: Contact Guard/Touching assist    Lower Body Dressing/Undressing Lower body dressing      What is the patient wearing?: Underwear/pull up, Pants     Lower body assist Assist for lower body dressing: Moderate Assistance - Patient 50 - 74%     Toileting Toileting    Toileting assist Assist for toileting: Moderate Assistance - Patient 50 - 74%     Transfers Chair/bed transfer  Transfers assist     Chair/bed transfer assist level:  Minimal Assistance - Patient > 75%     Locomotion Ambulation   Ambulation assist      Assist level: Contact Guard/Touching assist Assistive device: Walker-rolling Max distance: 45'   Walk 10 feet activity   Assist     Assist level: Contact Guard/Touching assist Assistive device: Walker-rolling   Walk 50 feet activity   Assist Walk 50 feet with 2 turns activity did not occur: Safety/medical concerns  Assist level: Contact Guard/Touching assist Assistive device: Walker-rolling    Walk 150 feet activity   Assist Walk 150 feet activity did not occur: Safety/medical concerns  Assist level: Contact Guard/Touching assist Assistive device: Walker-rolling    Walk 10 feet on uneven surface  activity   Assist Walk 10 feet on uneven surfaces activity did not occur: Safety/medical  concerns         Wheelchair     Assist Will patient use wheelchair at discharge?: No Type of Wheelchair: Manual    Wheelchair assist level: Supervision/Verbal cueing Max wheelchair distance: 162ft    Wheelchair 50 feet with 2 turns activity    Assist        Assist Level: Supervision/Verbal cueing   Wheelchair 150 feet activity     Assist      Assist Level: Supervision/Verbal cueing   Blood pressure 107/72, pulse 81, temperature 98.2 F (36.8 C), resp. rate 20, weight 83.9 kg, SpO2 100 %.  Medical Problem List and Plan: 1.  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  Status post stereotactic right temporal craniotomy resection of brain metastasis 09/03/2020. Has small 12mm Right frontal mass as well  Follow-up medical oncology as well as radiation oncology with radiation therapy as directed.  Continue Decadron protocol             -patient may shower once staples out             -ELOS/Goals: 12-16 days supervision  -Continue PT, OT and SLP    -After discussion with palliative care patient and wife have decided on DNR status 2.  Antithrombotics: -DVT/anticoagulation: SCDs.             -antiplatelet therapy: N/A 3. Pain Management:  N/A. Discontinue Norco  4. Situational anxiety: Discontinue Ativan 0.25- no need for scans.              -antipsychotic agents: N/A 5. Neuropsych: This patient is capable of making decisions on his own behalf. 6. Skin/Wound Care: Routine skin checks. Staples d/ced on 6/14- nursing order placed. Site is c/d/i 7. Fluids/Electrolytes/Nutrition: Routine in and outs with follow-up chemistries 8.  Seizure prophylaxis.  d/c Keppra, completed course 9.  Hypertension.  d/c anti-hypertensives. Resolved.  10.  Hyperlipidemia.  Continue Lipitor Vitals:   09/23/20 0635 09/23/20 1339  BP: 116/81 107/72  Pulse: 91 81  Resp: 18 20  Temp: (!) 97.5 F (36.4 C) 98.2 F (36.8 C)  SpO2: 95% 100%   Soft but BP meds jusstopped 11.   Constipation.  Continue Senokot twice daily, Colace twice daily, Dulcolax suppository daily as needed. Received sorbitol once.  12. Hemorrhoids: bleeding, painless, discussed hgb stable, repeat Monday 13. Increased weakness/slowed response: Head CT normal. Labs stable. UA/UC normal. Cr better today Magnesium normal 14. AKI: now resolved, could have been contributing to worsening function: D/c HCTZ and lisinopril.  15. Hypomagnesium: supplemented with 2g IV 6/18  Has also helped with BP after stopping two anti-hypertensives.  16. Severe vitamin D deficiency: Ergocalciferol 50,000U weekly x7 weeks 17. Insomnia: Trazodone 100mg  HS PRN  ordered  18. CODE STATUS: DNR: palliative care involved, recommend cont palliative care as OP 19. Disposition: HFU scheduled with me on 11/22/20 at 2pm. D/c 6/22. Called wife to update her.   LOS: 13 days A FACE TO FACE EVALUATION WAS PERFORMED  Izora Ribas 09/23/2020, 2:37 PM

## 2020-09-23 NOTE — Progress Notes (Signed)
Physical Therapy Session Note  Patient Details  Name: Gabriel [name] MRN: [id number] Date of Birth: [birth year]  Today's Date: 09/23/2020 PT Individual Time: 1415-1500 PT Individual Time Calculation (min): 45 min   Short Term Goals: Week 2:  PT Short Term Goal 1 (Week 2): STG=LTG due to LOS  Skilled Therapeutic Interventions/Progress Updates:     Pt received sitting in recliner, agreeable to PT tx - no reports of pain, flat affect throughout session. W/c transport for time management downstairs to 30M rehab gym. Completed stand<>pivot transfer with minA and no AD to mat table from w/c. Completed 1x5 sit<>stands with minA to RW from mat table height - cues for forward weight shift and hand placement. Family (wife and son) arriving during these transfer training and observed for remaining of session. Next, worked on functional gait training where he ambulated ~43ft + ~61ft with minA and RW with family providing w/c follow for safety. Gait deficits include short shuffling steps with decreased gait speed and poor foot clearance. Pt returned upstairs and requested to sit in the recliner. Completed stand<>pivot transfer, requiring modA to pivot to recliner - fatigue impacting technique and strength in BLE's. He remained seated in recliner with all needs in reach and chair alarm on, family at bedside, pleased to see his progress.  Therapy Documentation Precautions:  Precautions Precautions: Fall Precaution Comments: crani Restrictions Weight Bearing Restrictions: No General:    Therapy/Group: Individual Therapy  Alger Simons 09/23/2020, 7:50 AM

## 2020-09-23 NOTE — Progress Notes (Signed)
Occupational Therapy Session Note  Patient Details  Name: Gabriel [name] MRN: [id number] Date of Birth: 1930-11-27  Today's Date: 09/23/2020 OT Individual Time: 1103-1200 OT Individual Time Calculation (min): 57 min    Short Term Goals: Week 2:  OT Short Term Goal 1 (Week 2): STG = LTG due to LOS  Skilled Therapeutic Interventions/Progress Updates:   Met pt sitting in w/c, pt's wife and son present, pt agreed to session. Treatment session was focused on self-care retraining and family education. OTS educated wife and son on pt status during ADLs and due to his strength how his assistance needed from them will vary. OTS offered a shower, wife really wanted pt to shower. Pt requested to use bathroom before showering. Wife requested pt be brought to bathroom with w/c and transfer. Pt was Min A for stand pivot to commode. Wife was educated on transfers to commode but did not feel comfortable transferring him from commode to shower due to pt's strength and awkward set-up of bathroom. Family requested to see sink bath instead. Pt was Min A for bathing needing most assistance during peri-care. Pt completed grooming with set-up assist. OTS educated wife on importance of allowing pt to complete tasks as independently as possible. Pt was CGA for UB dressing and Mod A for LB dressing requiring assistance with pulling clothes up. Wife expressed concerns with d/c due to fear of his physical assistance and her not being able to safely take care of him, notified MD of fear. OT and OTS suggested use of beside commode, social worker notified. Left pt sitting in w/c, all needs met.   Therapy Documentation Precautions:  Precautions Precautions: Fall Precaution Comments: crani Restrictions Weight Bearing Restrictions: No Pain: Pain Assessment Pain Scale: Faces Pain Score: 1  Faces Pain Scale: No hurt Pain Type: Acute pain Pain Location: Neck Pain Orientation: Upper Pain Radiating Towards: back Pain  Intervention(s): Medication (See eMAR) ADL: ADL Eating: Not assessed Where Assessed-Eating: Edge of bed Grooming: Minimal assistance (OTS washed pt hair and combed hair, pt able to apply deodorant) Where Assessed-Grooming: Chair Upper Body Bathing: Supervision/safety Where Assessed-Upper Body Bathing: Chair Lower Body Bathing: Supervision/safety Where Assessed-Lower Body Bathing: Chair Upper Body Dressing: Supervision/safety Where Assessed-Upper Body Dressing: Chair Lower Body Dressing: Maximal assistance Where Assessed-Lower Body Dressing: Chair Toileting: Not assessed Where Assessed-Toileting: Glass blower/designer: Not assessed Armed forces technical officer Method: Counselling psychologist: Radiographer, therapeutic: Not assessed Social research officer, government: Not assessed Social research officer, government Method: Radiographer, therapeutic: Shower seat with back, Grab bars   Therapy/Group: Individual Therapy  Bayard Males 09/23/2020, 12:31 PM

## 2020-09-23 NOTE — Progress Notes (Signed)
Palliative Medicine Inpatient Follow Up Note  Reason for consult:  Goals of Care   HPI:  Per intake H&P -->  HPI: Gabriel Gabriel [name] Gabriel [name] is a 87 year old right-handed male with history of hyperlipidemia, hypertension, CVA with unsteady gait maintained on Plavix.  Per chart review patient lives with spouse.  Independent with assistive device.  1 Gabriel [name] home 2 steps to entry.  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  CT scan revealed left upper lobe mass 7.8 x 6.8 cm in size.  A small left hilar node was also identified.  Additionally there was renal masses concerning for primary renal disease versus metastatic disease.  MRI of the brain demonstrated 3 intracranial metastases including 2 small subcentimeter metastases and a larger right hemorrhagic lesion.  Patient was established with medical oncology Dr. Mohammed/radiation oncology Dr. Lisbeth Gabriel [name].  PET scan completed showing hypermetabolic lingular mass with central necrosis consistent with primary bronchogenic carcinoma.  No evidence of mediastinal nodal metastasis.  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  Patient was admitted 09/03/2020 and underwent stereotactic right temporal craniotomy for resection of brain metastasis per Dr. Kathyrn Gabriel [name].  Surgical pathology report consistent with metastatic melanoma.  Maintained on Keppra for seizure prophylaxis.  Decadron protocol as indicated.  Medical oncology as well as radiation oncology continue to follow with radiation therapy as directed.  Tolerating a regular consistency diet.  No current plan at this time for DVT prophylaxis due to some bleeding hemorrhoids.  Therapy evaluations completed due to patient decreased functional mobility patient was admitted for a comprehensive rehab program where he has been for the past ten days. Plan for discharge midweek. Palliative care has been requested to  further discuss goals of care in the setting of metastatic disease.   Today's Discussion (09/23/2020):  *Please note that this is a verbal dictation therefore any spelling or grammatical errors are due to the "[place] One" system interpretation.  Chart reviewed.   I was able to speak to patients bedside RN, Gabriel Gabriel [name] Gabriel [name] who shared with me concerns r/t patients lack of sleep and overall weakness. She shares that she has cared for Gabriel Gabriel [name] Gabriel [name] on prior occassions and he is far more debilitated now than previously.   I met at bedside with Gabriel Gabriel [name] Gabriel [name] this afternoon. He is sleeping in the chair, I gently awoke him. He reports feeling fair. We reviewed his lack of sleep overnight and the impact this was having on him. He denies any concerns.  Unfortunately, Gabriel Gabriel [name] Gabriel [name] was not present at bedside this afternoon. Though per MSW note review it is worrisome in terms of whether or not she will be able to accommodate Gabriel Gabriel [name] Gabriel [name]'s complex needs in the home due to the physical demand it will have on her.   I was able to secure chat Gabriel Gabriel [name] Gabriel [name], MSW for further conversation.  [facility] has been alerted to follow along as I suspect Gabriel Gabriel [name] Gabriel [name] may evolve into needing hospice sooner than later.  Objective Assessment: Vital Signs Vitals:   09/23/20 0635 09/23/20 1339  BP: 116/81 107/72  Pulse: 91 81  Resp: 18 20  Temp: (!) 97.5 F (36.4 C) 98.2 F (36.8 C)  SpO2: 95% 100%    Intake/Output Summary (Last 24 hours) at 09/23/2020 1643 Last data filed at 09/23/2020 1230 Gross per 24 hour  Intake 456 ml  Output --  Net 456 ml    Last Weight  Most recent update: 09/10/2020 11:12 PM  Weight  83.9 kg (184 lb 15.5 oz)            Gen:  Elderly M in NAD HEENT: moist mucous membranes CV: Regular rate and rhythm, no murmurs rubs or gallops PULM: clear to auscultation bilaterally ABD: soft/nontender/ EXT: No edema Neuro: Alert and oriented x3  SUMMARY OF RECOMMENDATIONS   DNAR/DNI   MOST Completed, paper copy placed onto  the chart electric copy can be found in [facility]   DNR Form Completed, paper copy placed onto the chart electric copy can be found in [facility]   TOC OP Palliative Care   Ongoing support incrementally  Time Spent: 25 Greater than 50% of the time was spent in counseling and coordination of care ______________________________________________________________________________________ [facility] Team Team Cell Phone: [phone] Please utilize secure chat with additional questions, if there is no response within 30 minutes please call the above phone number  Palliative Medicine Team providers are available by phone from 7am to 7pm daily and can be reached through the team cell phone.  Should this patient require assistance outside of these hours, please call the patient's attending physician.

## 2020-09-23 NOTE — Progress Notes (Signed)
Patient ID: Gabriel [name], male   DOB: 05-Jul-1930, 87 y.o.   MRN: [id number]  Met with pt and wife along with son who is here for education. MD was answering questions and addressing his decline in function. Discussed with his decline he is not at a level wife can manage him. MD is adjusting meds and addressing issues. Will discuss tomorrow in team conference extending stay and see if better tomorrow. Wife has a rolling walker at home and now wants a bedside commode and wheelchair, will make referral to [facility]. With pt's insurance he is not eligible to go SNF.

## 2020-09-23 NOTE — Progress Notes (Signed)
[facility] ([facility])  Hospital Liaison: RN note         This patient has been referred to our palliative care services in the community.  [facility] will continue to follow for any discharge planning needs and to coordinate continuation of palliative care in the outpatient setting.    If you have questions or need assistance, please call [phone] or contact the hospital Liaison listed on AMION.      Thank you for this referral.         Mary Anne Robertson, RN, CCM  [facility] Hospital Liaison   [phone] 

## 2020-09-23 NOTE — Plan of Care (Signed)
  Problem: RH Balance Goal: LTG Patient will maintain dynamic standing with ADLs (OT) Description: LTG:  Patient will maintain dynamic standing balance with assist during activities of daily living (OT)  Flowsheets (Taken 09/23/2020 1227) LTG: Pt will maintain dynamic standing balance during ADLs with: Minimal Assistance - Patient > 75% Note: Downgraded due to pt's fluctuation of strength.    Problem: Sit to Stand Goal: LTG:  Patient will perform sit to stand in prep for activites of daily living with assistance level (OT) Description: LTG:  Patient will perform sit to stand in prep for activites of daily living with assistance level (OT) Flowsheets (Taken 09/23/2020 1227) LTG: PT will perform sit to stand in prep for activites of daily living with assistance level: Minimal Assistance - Patient > 75% Note: Downgraded due to pt's fluctuation of strength.    Problem: RH Grooming Goal: LTG Patient will perform grooming w/assist,cues/equip (OT) Description: LTG: Patient will perform grooming with assist, with/without cues using equipment (OT) Flowsheets (Taken 09/23/2020 1227) LTG: Pt will perform grooming with assistance level of: Contact Guard/Touching assist Note: Downgraded due to pt's fluctuation of strength.    Problem: RH Bathing Goal: LTG Patient will bathe all body parts with assist levels (OT) Description: LTG: Patient will bathe all body parts with assist levels (OT) Flowsheets (Taken 09/23/2020 1227) LTG: Pt will perform bathing with assistance level/cueing: Contact Guard/Touching assist Note: Downgraded due to pt's fluctuation of strength.    Problem: RH Dressing Goal: LTG Patient will perform lower body dressing w/assist (OT) Description: LTG: Patient will perform lower body dressing with assist, with/without cues in positioning using equipment (OT) Flowsheets (Taken 09/23/2020 1227) LTG: Pt will perform lower body dressing with assistance level of: Minimal Assistance - Patient >  75% Note: Downgraded due to pt's fluctuation of strength.    Problem: RH Toileting Goal: LTG Patient will perform toileting task (3/3 steps) with assistance level (OT) Description: LTG: Patient will perform toileting task (3/3 steps) with assistance level (OT)  Flowsheets (Taken 09/23/2020 1227) LTG: Pt will perform toileting task (3/3 steps) with assistance level: Minimal Assistance - Patient > 75% Note: Downgraded due to pt's fluctuation of strength.    Problem: RH Toilet Transfers Goal: LTG Patient will perform toilet transfers w/assist (OT) Description: LTG: Patient will perform toilet transfers with assist, with/without cues using equipment (OT) Flowsheets (Taken 09/23/2020 1227) LTG: Pt will perform toilet transfers with assistance level of: Minimal Assistance - Patient > 75% Note: Downgraded due to pt's fluctuation of strength.    Problem: RH Tub/Shower Transfers Goal: LTG Patient will perform tub/shower transfers w/assist (OT) Description: LTG: Patient will perform tub/shower transfers with assist, with/without cues using equipment (OT) Flowsheets (Taken 09/23/2020 1227) LTG: Pt will perform tub/shower stall transfers with assistance level of: Minimal Assistance - Patient > 75% Note: Downgraded due to pt's fluctuation of strength.

## 2020-09-23 NOTE — Progress Notes (Signed)
Patient complain of gas/ bloated stomach. Denies constipation, last bm 6/19. Dr Ella Bodo notified and new or obtained for mylacon 80mg  q4hrs prn.

## 2020-09-23 NOTE — Progress Notes (Signed)
Speech Language Pathology Daily Session Note  Patient Details  Name: Gabriel [name] MRN: [id number] Date of Birth: 1930-06-23  Today's Date: 09/23/2020 SLP Individual Time: 1000-1100 SLP Individual Time Calculation (min): 60 min  Short Term Goals: Week 2: SLP Short Term Goal 1 (Week 2): Pt will demonstrate alternating attention in 5-7 minute intervals with supervision A verbal cues, SLP Short Term Goal 2 (Week 2): Pt will demonstrate mildyl complex problem solving skills in familiar tasks with mod A verbal cues. SLP Short Term Goal 3 (Week 2): Pt will demonstrate self-monitoring in basic problem solving tasks with min A verbal cues. SLP Short Term Goal 4 (Week 2): Pt will demonstrate recall of novel, functional information with mod A verbal cues for compensatory aids.  Skilled Therapeutic Interventions: Skilled SLP intervention focused on family education. Wife shared with SLP that patient did not sleep well last night however participated well In physical therapy session. Wife and son educated on functional tasks he can complete at home for problem solving , attention, and memory. Wife plans to purchase a calendar for patient to use to keep track of appointments. Educated wife and son that he will need assistance with tasks requiring multiple steps and higher level problem solving. Family is very supportive and in agreement. Cont with therapy per plan of care.       Pain Pain Assessment Pain Scale: Faces Pain Score: 1  Faces Pain Scale: No hurt Pain Type: Acute pain Pain Location: Neck Pain Orientation: Upper Pain Radiating Towards: back Pain Intervention(s): Medication (See eMAR)  Therapy/Group: Individual Therapy  Darrol Poke Makhia Vosler 09/23/2020, 10:40 AM

## 2020-09-23 NOTE — Progress Notes (Signed)
Physical Therapy Session Note  Patient Details  Name: Gabriel [name] MRN: [id number] Date of Birth: July 18, 1930  Today's Date: 09/23/2020 PT Individual Time: 0900-1000 PT Individual Time Calculation (min): 60 min   Short Term Goals: Week 2:  PT Short Term Goal 1 (Week 2): STG=LTG due to LOS  Skilled Therapeutic Interventions/Progress Updates: Pt presents sitting in w/c and agreeable to therapy.  Pt spouse and son in room for family ed.  Pt and family states taken to [facility] and relieved of gas.  Pt states decreased pain.  Pt performed multiple sit to stand transfers from elevated bed, recliner and w/c w/ min A and verbal cues and education to family for initiation, including hand and foot placement, and forward lean.  Pt amb multiple trials w/ RW and min A, verbal cues for posture and L foot clearance.  Spouse performed transfers sit to stand from recliner w/ RW stepping to bed w/ min A.  Then educated on squat pivot transfer bed > w/c.w/ min A and spouse assist.  Family seems encouraged by performance but may still need further days of therapy for safe transition to home w/ spouse only, [place].  PT states may have good and bad days.  Family looking into further assist at home.  Family also concerned w/ steps and car transfers, which were not addressed today.  Pt remained sitting in w/c w/ all needs in reach and family is remaining in room.     Therapy Documentation Precautions:  Precautions Precautions: Fall Precaution Comments: crani Restrictions Weight Bearing Restrictions: No General:   Vital Signs: Therapy Vitals Temp: (!) 97.5 F (36.4 C) Pulse Rate: 91 Resp: 18 BP: 116/81 Patient Position (if appropriate): Lying Oxygen Therapy SpO2: 95 % O2 Device: Room Air Pain: c/o abd pain from gas. Pain Assessment Pain Scale: Faces Pain Score: 1  Pain Type: Acute pain Pain Location: Neck Pain Orientation: Upper Pain Radiating Towards: back Pain Intervention(s):  Medication (See eMAR) Mobility:       Therapy/Group: Individual Therapy  [name] 09/23/2020, 10:31 AM

## 2020-09-23 NOTE — Discharge Summary (Signed)
Physician Discharge Summary  Patient ID: Gabriel [name] MRN: [id number] DOB/AGE: 04-19-1930 87 y.o.  Admit date: 09/10/2020 Discharge date: 10/02/2020  Discharge Diagnoses:  Principal Problem:   Metastatic melanoma [facility]) Active Problems:   Palliative care by specialist Hypertension Hyperlipidemia Constipation AKI  Discharged Condition: Stable  Significant Diagnostic Studies: DG Thoracic Spine W/Swimmers  Result Date: 09/24/2020 CLINICAL DATA:  Upper back pain for 1 day EXAM: THORACIC SPINE - 3 VIEWS COMPARISON:  PET-CT from 08/22/2020 FINDINGS: There are changes in the lingula consistent with the known lingular mass seen on prior PET-CT. Vertebral body height is well maintained. No paraspinal mass is noted. Very mild osteophytic changes are seen. Pedicles are within normal limits. IMPRESSION: Changes consistent with the known lingular mass. Mild degenerative change of the thoracic spine without acute abnormality. Electronically Signed   By: Inez Catalina M.D.   On: 09/24/2020 19:06   CT HEAD WO CONTRAST  Result Date: 09/20/2020 CLINICAL DATA:  Mental status change, history of metastatic disease EXAM: CT HEAD WITHOUT CONTRAST TECHNIQUE: Contiguous axial images were obtained from the base of the skull through the vertex without intravenous contrast. COMPARISON:  Correlation made with MRI brain 09/04/2020 FINDINGS: Brain: There is no acute intracranial hemorrhage or significant mass effect. No acute appearing loss of gray-white differentiation. Right temporal resection cavity is noted. Thin extra-axial collection containing fluid and air remains present. Ventral extra-axial air as resolved. Surrounding hypoattenuation extending into the frontoparietal lobes is similar in extent to signal abnormality on MRI. Left inferior frontal and left cerebellar lesions on MRI are not visible by CT. Additional patchy low-attenuation in the supratentorial white matter is nonspecific but probably reflects  chronic microvascular and/or treatment related changes. Prominence of the ventricles and sulci reflects cerebral and cerebellar parenchymal volume loss. No hydrocephalus. Vascular: There is atherosclerotic calcification at the skull base. Skull: Right craniotomy.  Otherwise unremarkable. Sinuses/Orbits: No acute finding. Other: None. IMPRESSION: No acute intracranial hemorrhage or evidence of acute infarction. No significant mass effect. Expected evolution of recent postoperative changes. Electronically Signed   By: Macy Mis M.D.   On: 09/20/2020 11:31   MR BRAIN W WO CONTRAST  Result Date: 09/04/2020 CLINICAL DATA:  Status post craniotomy for tumor resection. EXAM: MRI HEAD WITHOUT AND WITH CONTRAST TECHNIQUE: Multiplanar, multiecho pulse sequences of the brain and surrounding structures were obtained without and with intravenous contrast. CONTRAST:  60mL GADAVIST GADOBUTROL 1 MMOL/ML IV SOLN COMPARISON:  Brain MRI 08/23/2020 FINDINGS: Brain: Status post resection of lesion centered in the right temporal lobe. Contrast enhancement at the posterior aspect of the resection cavity appears to be vascular. No nodular enhancement. There is moderate pneumocephalus. 7 mm lesion at the superior right frontal lobe is unchanged. There is mild diffusion restriction at the posterior margin of the resection cavity. Mild surrounding edema. There is generalized volume loss with ex vacuo dilatation of the ventricles. There is multifocal periventricular white matter hyperintensity, most often a result of chronic microvascular ischemia. Expected blood products surrounding the resection cavity. Vascular: Normal flow voids. Skull and upper cervical spine: Status post right craniotomy. Sinuses/Orbits: Negative Other: None IMPRESSION: 1. Status post resection of right temporal lobe mass without evidence of residual tumor. This will serve as a baseline for future studies. 2. Small amount of diffusion restriction at the posterior  aspect of the resection cavity, likely a small area of ischemia. This site may enhance on future studies. 3. Moderate pneumocephalus. 4. Unchanged 7 mm lesion at the superior right frontal lobe. Electronically  Signed   By: Ulyses Jarred M.D.   On: 09/04/2020 04:01    Labs:  Basic Metabolic Panel: Recent Labs  Lab 09/30/20 0556  NA 136  K 4.0  CL 101  CO2 25  GLUCOSE 95  BUN 25*  CREATININE 0.96  CALCIUM 8.3*     CBC: Recent Labs  Lab 09/30/20 0556 09/30/20 1119  WBC 7.4  --   HGB 8.1* 9.3*  HCT 26.0* 29.8*  MCV 87.2  --   PLT 118*  --      CBG: Recent Labs  Lab 10/01/20 1215 10/01/20 1622 10/01/20 2051  GLUCAP 105* 121* 160*    Family history.  Mother with myocardial infarction.  Father with CAD.  Father with prostate cancer.  Brother with renal cancer.  Denies any colon cancer esophageal cancer or rectal cancer  Brief HPI:   Gabriel [name] is a 87 y.o. right-handed male with history of hyperlipidemia hypertension CVA with unsteady gait maintained on Plavix.  Lives with spouse independent with assistive device.  Recently diagnosed stage IV melanoma findings of large left lung mass with CT scan completed 07/23/2020.  CT scan revealed left upper lobe mass 7.8 x 6.8 cm in size.  A small left hilar node was also identified.  Additionally there was renal masses concerning for primary renal disease versus metastatic disease.  MRI of the brain demonstrated 3 cm intracranial metastasis including 2 small subcentimeter mets and a larger right hemorrhagic lesion.  Patient was established with medical oncology Gabriel [name]/radiation oncology Gabriel [name].  PET scan completed showing hypermetabolic lingular mass with central necrosis consistent with primary bronchogenic carcinoma.  No evidence of mediastinal nodal metastasis.  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  Patient was admitted 09/03/2020 and underwent stereotactic right  temporal craniotomy resection of brain metastasis per Gabriel [name].  Surgical pathology report consistent with metastatic melanoma.  Maintained on Keppra for seizure prophylaxis.  Decadron protocol as indicated.  Medical oncology as well as radiation oncology continue to follow with radiation therapy as directed.  Tolerating a regular consistency diet.  No current plan at this time for DVT prophylaxis due to some bleeding hemorrhoids.  Therapy evaluations completed due to patient's decreased functional mobility was admitted for a comprehensive rehab program.   Hospital Course: [facility] was admitted to rehab 09/10/2020 for inpatient therapies to consist of PT, ST and OT at least three hours five days a week. Past admission physiatrist, therapy team and rehab RN have worked together to provide customized collaborative inpatient rehab.  Pertaining to patient's debility secondary to metastatic melanoma status post stereotactic right temporal craniotomy resection of brain metastasis 09/03/2020.  Patient also had a small 7 mm right frontal mass as well and would follow-up neurosurgery.  Latest CT imaging 09/20/2020 showed no acute intracranial hemorrhage or evidence of acute infarction.  No significant mass-effect.  Expected evolution of recent postoperative changes.  Outpatient follow-up medical oncology as well as radiation oncology with radiation therapy as directed.  Decadron protocol as indicated.  Lidoderm patch was added for pain control.  Palliative care was consulted to establish goals of care and DNR status.  Seizure prophylaxis course of Keppra completed no seizure activity.  Mood stabilization with Remeron and emotional support provided.  Blood pressure controlled on lisinopril as well as HCTZ follow-up outpatient.  Lipitor ongoing for hyperlipidemia.  Bouts of constipation resolved with laxative assistance.   Blood pressures were monitored on TID basis  and soft and monitored     Rehab course:  During patient's stay in rehab weekly team conferences were held to monitor patient's progress, set goals and discuss barriers to discharge. At admission, patient required min mod assist 75 feet 1 person hand-held assist minimal guard sit to stand set up for eating set up supervision upper body bathing minimal assist lower body bathing minimal assist upper body dressing mod assist lower body dressing  Physical exam.  Blood pressure 108/70 pulse 69 temperature 97.4 respiration 20 oxygen saturation 99% room air Constitutional.  No acute distress HEENT Head.  Right temporal craniotomy site clean and dry Eyes.  Pupils round and reactive to light no discharge without nystagmus Neck.  Supple nontender no JVD without thyromegaly Cardiac regular rate rhythm not extra sounds or murmur heard Abdomen.  Soft nontender positive bowel sounds without rebound Respiratory effort normal no respiratory distress without wheeze Musculoskeletal. Comments.  Right upper extremity 5/5 except finger abduction 5 -/5 Left upper extremity 5/5 except finger abduction 4+/5 Right lower extremity hip flexion 4+/5 knee extension 4/5 dorsi plantarflexion 4 -/5 Left lower extremity hip flexion 4 -/5 knee extension 4 -/5 dorsiflexion to minus/5 in plantarflexion 2 -/5 Neurologic.  Alert sitting up in bed makes eye contact with examiner follows simple commands.  Left inattention noted.  Intact to light touch all 4 extremities.  Patient was slightly tangential in topics also with some delay in processing.  He/She  has had improvement in activity tolerance, balance, postural control as well as ability to compensate for deficits. He/She has had improvement in functional use RUE/LUE  and RLE/LLE as well as improvement in awareness.  Sessions with emphasis on functional mobility/transfers generalized strengthening.  Patient transferred semireclined to sitting edge of bed with supervision use of bed rails needing some assist to don his  socks.  Patient scooted to edge of bed moderate assist and cues.  Sit to stand rolling walker moderate assist.  Patient can stand transfer bed to wheelchair rolling walker minimal assist.  Patient was able to ambulate short distances from bathroom to recliner rolling walker.  Needing some assist for balance.  Patient was supervision for upper body bathing minimal assist for lower body bathing quired assistance for bathing his bottom.  Patient donned upper body dressing with supervision lower body dressing max assist.  Full family teaching completed plan discharged to home       Disposition: Discharged to home    Diet: Carb modified  Special Instructions: No driving smoking or alcohol  Medications at discharge 1.  Tylenol as needed 2.  Lipitor 40 mg p.o. daily 3.  Decadron 2 mg every 8 hours and continue until follow-up with Gabriel [name] as well as oncology 4.  Protonix 40 mg nightly 5.  Senokot 1 tablet twice daily hold for loose stools 6.  Vitamin D 50,000 units every 7 days 7.  Lidoderm patch change as directed 8.  Remeron 7.5 mg nightly  30-35 minutes were spent completing discharge summary and discharge planning  Discharge Instructions     Ambulatory referral to Physical Medicine Rehab   Complete by: As directed    Moderate complexity follow up 1-2 weeks metastatic melanoma        Follow-up Information     Raulkar, Clide Deutscher, MD Follow up.   Specialty: Physical Medicine and Rehabilitation Why: 11/22/20 at 2pm Contact information: [address] [place] [address] [phone]         [name], MD  Follow up.   Specialty: Neurosurgery Why: Call for appointment Contact information: [address] [place] [address] [phone]         Curt Bears, MD Follow up.   Specialty: Oncology Why: Call for appointment Contact information: [place] [address] [phone]          Kyung Rudd, MD Follow up.   Specialty: Radiation Oncology Why: call for appointment Contact information: [place] [place] [address] [phone]                 Signed: Lavon Paganini [place] 10/02/2020, 5:10 AM

## 2020-09-24 ENCOUNTER — Inpatient Hospital Stay (HOSPITAL_COMMUNITY): Payer: Medicare Other

## 2020-09-24 IMAGING — CR DG THORACIC SPINE 3V
3 series · 3 of 3 positions shown · non-contrast
Comparison: PET-CT from [DATE]

CLINICAL DATA: Upper back pain for 1 day

EXAM:
THORACIC SPINE - 3 VIEWS

[t-spine ap]
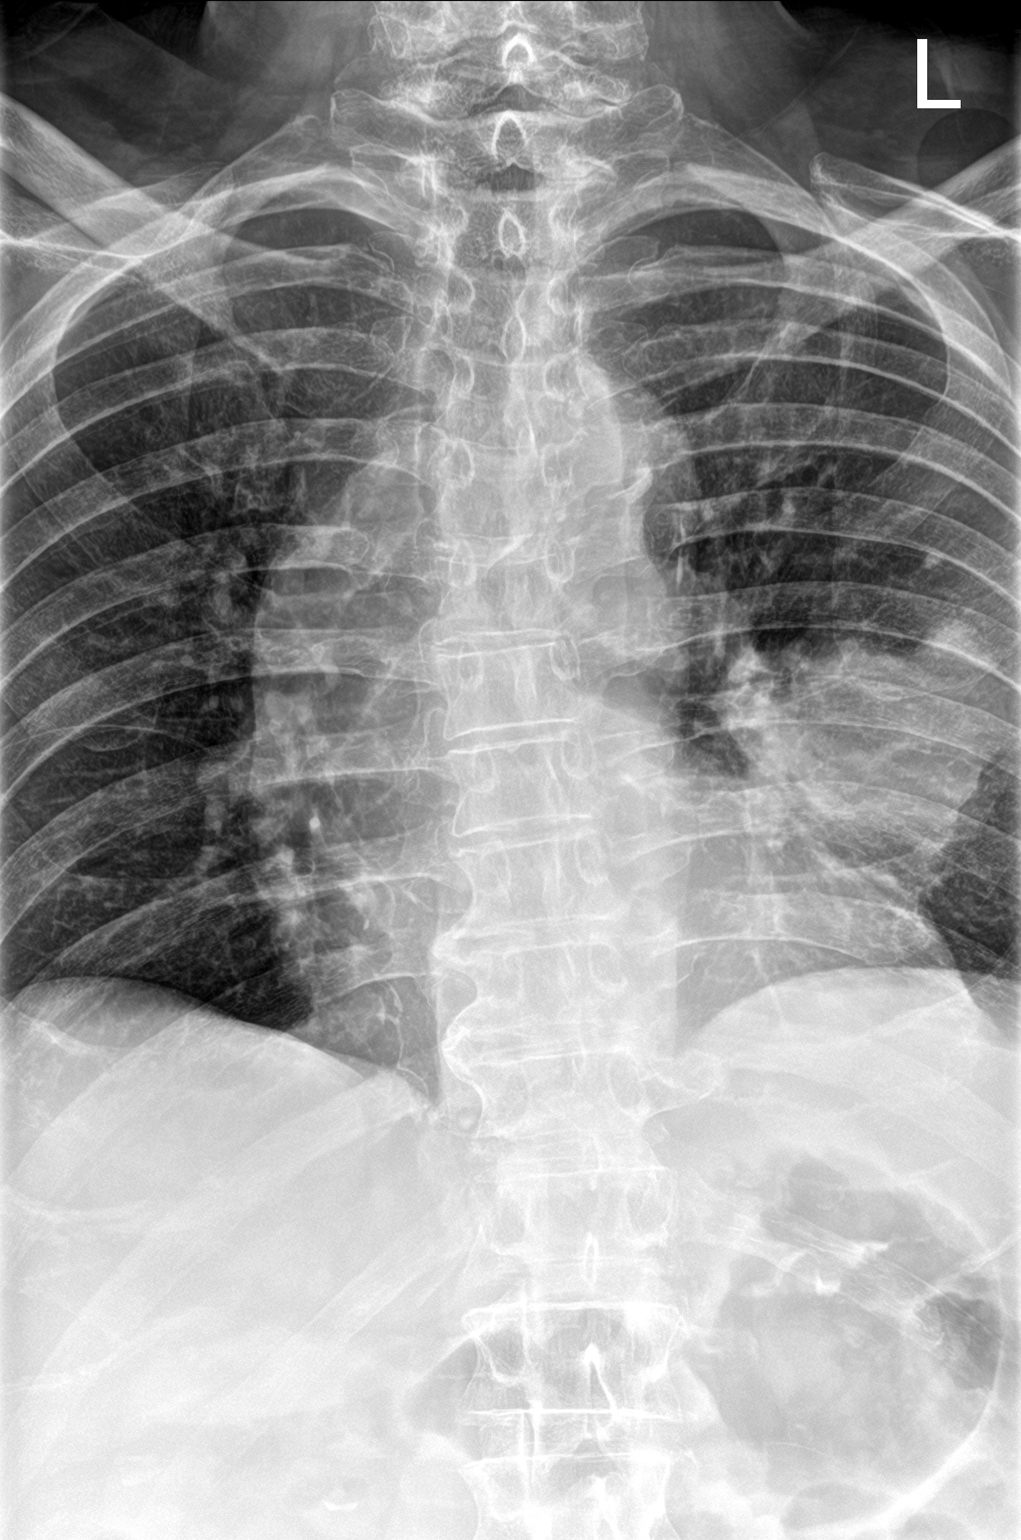

[t-spine lat (1 of 2)]
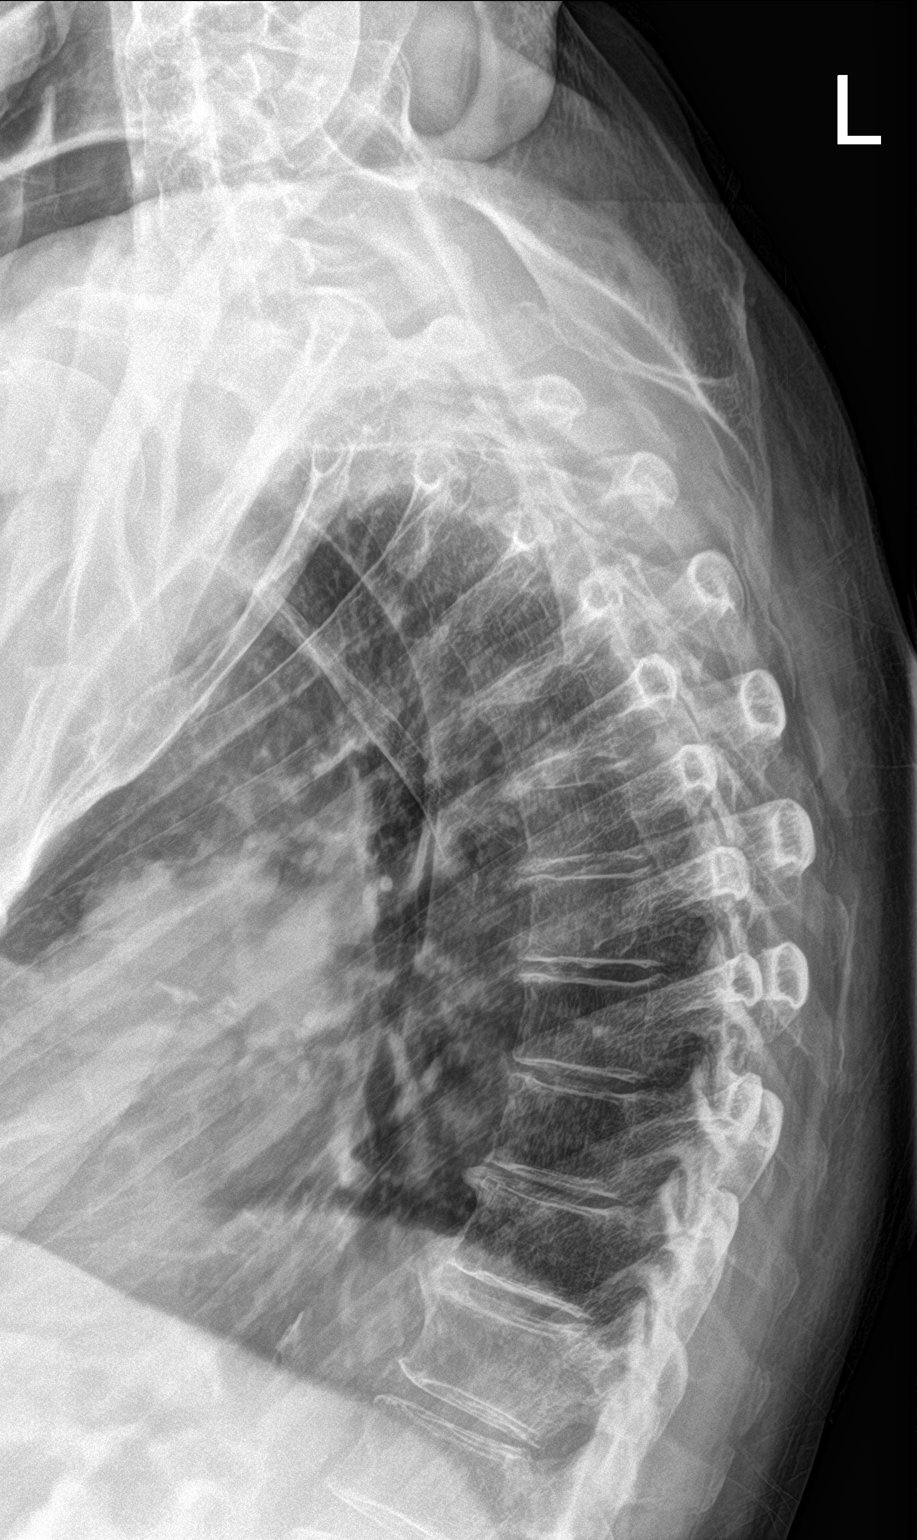

[t-spine lat (2 of 2)]
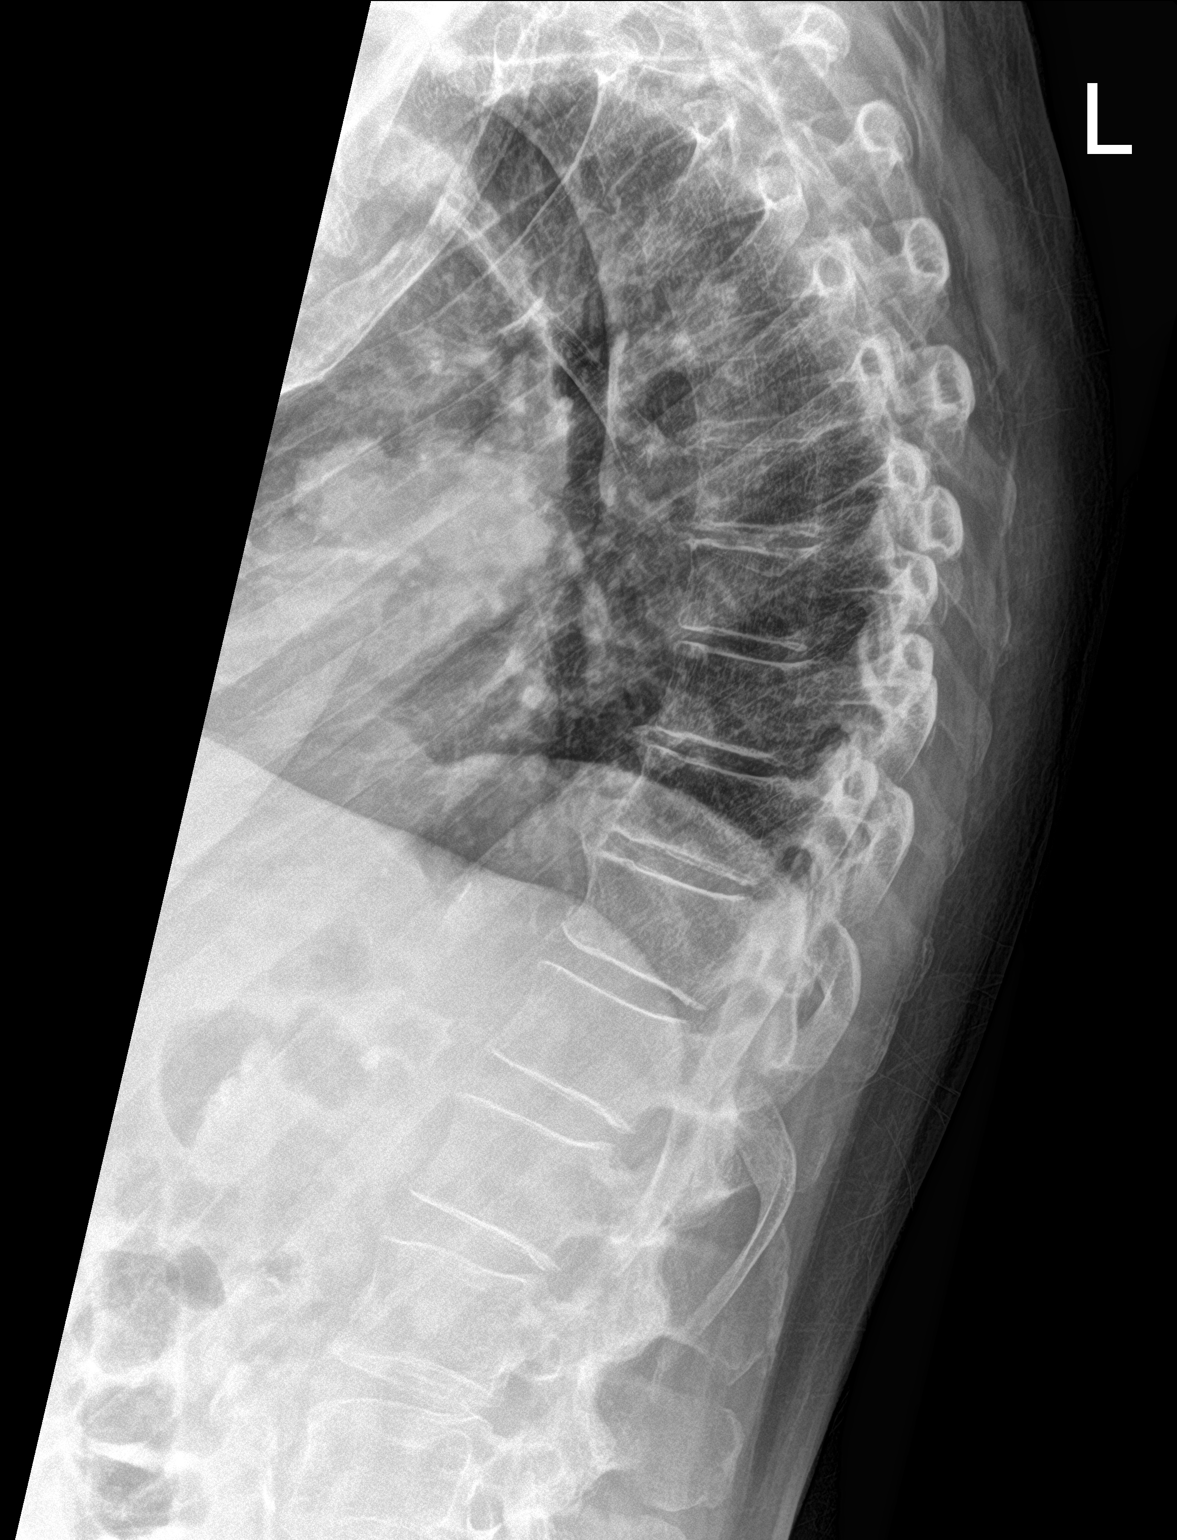

[3 of 3 positions shown; findings below may reference images not displayed]

FINDINGS: There are changes in the lingula consistent with the known lingular
mass seen on prior PET-CT. Vertebral body height is well maintained.
No paraspinal mass is noted. Very mild osteophytic changes are seen.
Pedicles are within normal limits.
IMPRESSION: Changes consistent with the known lingular mass.

Mild degenerative change of the thoracic spine without acute
abnormality.

## 2020-09-24 MED ORDER — LIDOCAINE 5 % EX PTCH
1.0000 | MEDICATED_PATCH | CUTANEOUS | Status: DC
  Administered 2020-09-24 – 2020-10-01 (×5): 1 via TRANSDERMAL
  Filled 2020-09-24 (×8): qty 1

## 2020-09-24 MED ORDER — MIRTAZAPINE 15 MG PO TABS
7.5000 mg | ORAL_TABLET | Freq: Every day | ORAL | Status: DC
  Administered 2020-09-24 – 2020-10-01 (×8): 7.5 mg via ORAL
  Filled 2020-09-24 (×8): qty 1

## 2020-09-24 MED ORDER — DOCUSATE SODIUM 50 MG/5ML PO LIQD
100.0000 mg | Freq: Two times a day (BID) | ORAL | Status: DC
  Administered 2020-09-25 – 2020-10-02 (×9): 100 mg via ORAL
  Filled 2020-09-24 (×17): qty 10

## 2020-09-24 MED ORDER — HYDROCODONE-ACETAMINOPHEN 5-325 MG PO TABS: 1.0000 | ORAL_TABLET | Freq: Four times a day (QID) | ORAL | Status: DC | PRN

## 2020-09-24 NOTE — Patient Care Conference (Signed)
Inpatient RehabilitationTeam Conference and Plan of Care Update Date: 09/24/2020   Time: 13:36 PM    Patient Name: Gabriel [name]      Medical Record Number: [id number]  Date of Birth: Jun 04, 1930 Sex: Male         Room/Bed: 5J88C/1Y60Y-30 Payor Info: Payor: [employer] MEDICARE / Plan: [facility] MEDICARE / Product Type: *No Product type* /    Admit Date/Time:  09/10/2020  6:54 PM  Primary Diagnosis:  Metastatic melanoma [facility])  Hospital Problems: Principal Problem:   Metastatic melanoma [facility]) Active Problems:   Palliative care by specialist    Expected Discharge Date: Expected Discharge Date: 10/02/20  Team Members Present: Physician leading conference: Dr. Leeroy Cha Care Coodinator Present: Dorien Chihuahua, RN, BSN, CRRN;Becky Dupree, LCSW Nurse Present: Dorien Chihuahua, RN PT Present: Becky Sax, PT OT Present: Other (comment) Paulette Blanch) SLP Present: Charolett Bumpers, SLP PPS Coordinator present : Ileana Ladd, PT     Current Status/Progress Goal Weekly Team Focus  Bowel/Bladder   Pt is currently cont of B/B. LBM 09/22/20  Patient will remain continent  Assist PRN and qshift   Swallow/Nutrition/ Hydration             ADL's   min-mod A with ADLs 2/2 fatigue levels  supervision  ADL retraining, transfer training, activity tolerance, balance, pt/ family education, standing tolerance   Mobility   Pt recently experiencing functional decline. Bed mobility supervision/mod A depending on fatigue, transfers with RW min A, gait 61ft with RW min A, need to reassess stairs  supervision  functional mobility/transfers, generalized strengthening, dynamic sitting/standing balance/coordination, gait, family educatoin, D/C planning, and improved endurance to activity.   Communication             Safety/Cognition/ Behavioral Observations  Min A  supervision-min A      Pain   No pain  Pt remains pain free  Assess qshift and prn   Skin   Surgical incision to R- side of Head clean  and intact  Skin will reain intact  Assess and treat qs/ prn     Discharge Planning:  wife and son were here yesterday for education-pt has had a functional decline and needs to be extended. Pallitive care involved   Team Discussion: Patient with abd, back pain interfering with sleep and function. AKI addressed; labs look better per MD. Insomnia issues addressed. MD added pain medication and lidocaine patch at HS. Staff acknowledged concern regarding amount of care needed and ability of wife to manage care at discharge. Patient declined hospice services at present.  Patient on target to meet rehab goals: Currently min - mod assist with supervision goals set.   *See Care Plan and progress notes for long and short-term goals.   Revisions to Treatment Plan:  Working on steps to enter home Addressing sequencing and leaning to right side (alleviate pain)  Working on mildly complex problem solving,  and recall   Teaching Needs: Safety, medications, steps, etc.   Current Barriers to Discharge: Decreased caregiver support and Home enviroment access/layout  Possible Resolutions to Barriers: Family education Recommend hired assistance to help wife provide for care needs       Medical Summary Current Status: generalized weakness, AKI, low back pain, insomnia, vitamin D deficiency, hypotensive  Barriers to Discharge: Medical stability  Barriers to Discharge Comments: generalized weakness, AKI, low back pain, insomnia, vitamin D deficiency, hypotensive Possible Resolutions to [employer] Focus: discontinue antihypertensive mediations, add lidocaine patch for lower back, start ergocalciferol 50,000U once  per week for 7 weeks, educated wife regarding use of TEDs at home if hypotensive   Continued Need for Acute Rehabilitation Level of Care: The patient requires daily medical management by a physician with specialized training in physical medicine and rehabilitation for the following  reasons: Direction of a multidisciplinary physical rehabilitation program to maximize functional independence : Yes Medical management of patient stability for increased activity during participation in an intensive rehabilitation regime.: Yes Analysis of laboratory values and/or radiology reports with any subsequent need for medication adjustment and/or medical intervention. : Yes   I attest that I was present, lead the team conference, and concur with the assessment and plan of the team.   Dorien Chihuahua B 09/24/2020, 4:03 PM

## 2020-09-24 NOTE — Progress Notes (Addendum)
Wife concerned about PO intake. Not eating much and would like to see if can get an appetite stimulant. Will discuss with PA/ MD. Hulen Skains PA will see if wife can bring foods from home that he likes.  Talked to wife, they have been bringing in foods pt usually likes when at home. Today they brought in some tacos. According to daughter, he really didn't eat. I did inform of new medication for appetite and side effect may also help with sleep. Discussed bowel movements also and that he really needs to have a BM.  Wife has dates in room. I informed her that will have him eat a few with dinner to see if helps.  Wants to try and avoid PRN BM meds d/t gas pains that may create.

## 2020-09-24 NOTE — Progress Notes (Signed)
Patient ID: Gabriel [name], male   DOB: [birth year], 87 y.o.   MRN: [id number]  Spoke with wife via telephone to discuss team conference and extension to 6/29. Wife very pleased with this plan and this will allow time to determine other equipment needs and allow pt to get back to the level he was at prior to decline-hopefully. Plan for pt is to go home and not to another facility. Discussed private duty list and she would like this she has some names of her own also to pursue. Work toward discharge of 6/29.

## 2020-09-24 NOTE — Plan of Care (Signed)
  Problem: Consults Goal: RH BRAIN INJURY PATIENT EDUCATION Description: Description: See Patient Education module for eduction specifics Outcome: Progressing   Problem: RH BOWEL ELIMINATION Goal: RH STG MANAGE BOWEL WITH ASSISTANCE Description: STG Manage Bowel with mod I Assistance. Outcome: Progressing Goal: RH STG MANAGE BOWEL W/MEDICATION W/ASSISTANCE Description: STG Manage Bowel with Medication with mod I Assistance. Outcome: Progressing   Problem: RH BLADDER ELIMINATION Goal: RH STG MANAGE BLADDER WITH ASSISTANCE Description: STG Manage Bladder With  mod I Assistance Outcome: Progressing   Problem: RH SAFETY Goal: RH STG ADHERE TO SAFETY PRECAUTIONS W/ASSISTANCE/DEVICE Description: STG Adhere to Safety Precautions With cues/reminders  Assistance/Device. Outcome: Progressing   Problem: RH PAIN MANAGEMENT Goal: RH STG PAIN MANAGED AT OR BELOW PT'S PAIN GOAL Description: At or below level 4  Outcome: Progressing   Problem: RH KNOWLEDGE DEFICIT BRAIN INJURY Goal: RH STG INCREASE KNOWLEDGE OF SELF CARE AFTER BRAIN INJURY Description: Patient will be able to manage care at discharge using handouts and educational tools with cues/reminders Outcome: Progressing

## 2020-09-24 NOTE — Progress Notes (Signed)
PROGRESS NOTE   Subjective/Complaints: Gas pains have resolved after stopping protein supplement Still having a hard time sleeping at night due to pain- discussed adding lidocaine patch for tonight.  Continues to have low BP- discussed with wife strategies to manage this while he is at home  ROS:  Pt denies SOB, abd pain, CP, N/V/C/D, and vision changes, pain, +diffuse weakness, gas pains have resolved   Objective:   No results found. No results for input(s): WBC, HGB, HCT, PLT in the last 72 hours.   No results for input(s): NA, K, CL, CO2, GLUCOSE, BUN, CREATININE, CALCIUM in the last 72 hours.    Intake/Output Summary (Last 24 hours) at 09/24/2020 1321 Last data filed at 09/23/2020 1720 Gross per 24 hour  Intake 20 ml  Output 200 ml  Net -180 ml        Physical Exam: Vital Signs Blood pressure (!) 95/59, pulse 85, temperature 98 F (36.7 C), resp. rate 16, weight 83.9 kg, SpO2 98 %. Gen: no distress, normal appearing HEENT: oral mucosa pink and moist, NCAT Cardio: Reg rate Chest: normal effort, normal rate of breathing Abd: soft, non-distended Ext: no edema Psych: pleasant, normal affect  Musculoskeletal:     Cervical back: Normal range of motion and neck supple.     Comments: RUE- 5/5 except finger abd 5-/5 LUE- 5/5 except finger abd 4+/5 RLE- HF 4+/5, KE 4/5, DF and PF 4-/5 LLE- HF 4-/5, KE 4-/5, DF 3-/5 and PF 3-/5 --mildly improved ankle motion Skin:    General: Skin is warm and dry. Crani incision CDI with staples  Neurological:     Comments: alert. Follows basic commands. Fair insight and awareness. Can be tangential. Delayed processing L inattention noted on exam Intact to light touch in all 4 extremities-   Assessment/Plan: 1. Functional deficits which require 3+ hours per day of interdisciplinary therapy in a comprehensive inpatient rehab setting. Physiatrist is providing close team  supervision and 24 hour management of active medical problems listed below. Physiatrist and rehab team continue to assess barriers to discharge/monitor patient progress toward functional and medical goals  Care Tool:  Bathing    Body parts bathed by patient: Right arm, Left arm, Chest, Abdomen, Front perineal area, Right upper leg, Left upper leg, Face   Body parts bathed by helper: Buttocks, Left lower leg, Right lower leg Body parts n/a: Right upper leg, Left upper leg, Right lower leg, Left lower leg (Did not complete due to time constraint)   Bathing assist Assist Level: Minimal Assistance - Patient > 75%     Upper Body Dressing/Undressing Upper body dressing   What is the patient wearing?: Button up shirt    Upper body assist Assist Level: Set up assist    Lower Body Dressing/Undressing Lower body dressing      What is the patient wearing?: Underwear/pull up, Pants     Lower body assist Assist for lower body dressing: Moderate Assistance - Patient 50 - 74%     Toileting Toileting    Toileting assist Assist for toileting: Moderate Assistance - Patient 50 - 74%     Transfers Chair/bed transfer  Transfers assist  Chair/bed transfer assist level: Minimal Assistance - Patient > 75%     Locomotion Ambulation   Ambulation assist      Assist level: Contact Guard/Touching assist Assistive device: Walker-rolling Max distance: 45'   Walk 10 feet activity   Assist     Assist level: Contact Guard/Touching assist Assistive device: Walker-rolling   Walk 50 feet activity   Assist Walk 50 feet with 2 turns activity did not occur: Safety/medical concerns  Assist level: Contact Guard/Touching assist Assistive device: Walker-rolling    Walk 150 feet activity   Assist Walk 150 feet activity did not occur: Safety/medical concerns  Assist level: Contact Guard/Touching assist Assistive device: Walker-rolling    Walk 10 feet on uneven surface   activity   Assist Walk 10 feet on uneven surfaces activity did not occur: Safety/medical concerns         Wheelchair     Assist Will patient use wheelchair at discharge?: No Type of Wheelchair: Manual    Wheelchair assist level: Supervision/Verbal cueing Max wheelchair distance: 160ft    Wheelchair 50 feet with 2 turns activity    Assist        Assist Level: Supervision/Verbal cueing   Wheelchair 150 feet activity     Assist      Assist Level: Supervision/Verbal cueing   Blood pressure (!) 95/59, pulse 85, temperature 98 F (36.7 C), resp. rate 16, weight 83.9 kg, SpO2 98 %.  Medical Problem List and Plan: 1.  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  Status post stereotactic right temporal craniotomy resection of brain metastasis 09/03/2020. Has small 63mm Right frontal mass as well  Follow-up medical oncology as well as radiation oncology with radiation therapy as directed.  Continue Decadron protocol             -patient may shower once staples out             -ELOS/Goals: 12-16 days supervision  -Continue PT, OT and SLP    -After discussion with palliative care patient and wife have decided on DNR status  -Interdisciplinary Team Conference today   2.  Antithrombotics: -DVT/anticoagulation: SCDs.             -antiplatelet therapy: N/A 3. Low back pain: Lidocaine patch ordered. Discontinue Norco  4. Situational anxiety: Discontinue Ativan 0.25- no need for scans.              -antipsychotic agents: N/A 5. Neuropsych: This patient is capable of making decisions on his own behalf. 6. Skin/Wound Care: Routine skin checks. Staples d/ced on 6/14- nursing order placed. Site is c/d/i 7. Fluids/Electrolytes/Nutrition: Routine in and outs with follow-up chemistries 8.  Seizure prophylaxis.  d/c Keppra, completed course 9.  Hypertension.  d/c anti-hypertensives. Resolved. Now hypotensive off medications: discussed with wife use of TEDs at home if hypotensive.   10.  Hyperlipidemia.  Continue Lipitor Vitals:   09/23/20 1917 09/24/20 0511  BP: 120/79 (!) 95/59  Pulse: 77 85  Resp: 18 16  Temp: 98.2 F (36.8 C) 98 F (36.7 C)  SpO2: 100% 98%   11.  Constipation.  Continue Senokot twice daily, Colace twice daily, Dulcolax suppository daily as needed. Received sorbitol once.  12. Hemorrhoids: bleeding, painless, discussed hgb stable, repeat Monday 13. Increased weakness/slowed response: Head CT normal. Labs stable. UA/UC normal. Cr better today Magnesium normal 14. AKI: now resolved, could have been contributing to worsening function: D/c HCTZ and lisinopril.  15. Hypomagnesium: supplemented with 2g IV 6/18  Has also helped  with BP after stopping two anti-hypertensives.  16. Severe vitamin D deficiency: Ergocalciferol 50,000U weekly x7 weeks 17. Insomnia: Trazodone 100mg  HS PRN ordered  18. CODE STATUS: DNR: palliative care involved, recommend cont palliative care as OP 19. Disposition: HFU scheduled with me on 11/22/20 at 2pm. D/c 6/22. Called wife to update her.   LOS: 14 days A FACE TO FACE EVALUATION WAS PERFORMED  Tanija Germani P Colleen Donahoe 09/24/2020, 1:21 PM

## 2020-09-24 NOTE — Progress Notes (Signed)
Physical Therapy Session Note  Patient Details  Name: Gabriel [name] MRN: [id number] Date of Birth: February 27, 1931  Today's Date: 09/24/2020 PT Individual Time: 8592-9244 PT Individual Time Calculation (min): 19 min   Today's Date: 09/24/2020 PT Missed Time: 11 Minutes Missed Time Reason: Nursing care  Short Term Goals: Week 1:  PT Short Term Goal 1 (Week 1): Pt will ambulate 149ft with CGA PT Short Term Goal 1 - Progress (Week 1): Met PT Short Term Goal 2 (Week 1): Pt will perform bed mobility with supervision assist PT Short Term Goal 2 - Progress (Week 1): Met PT Short Term Goal 3 (Week 1): Pt will improve Berg balance score to >35 to indicate improved safety with functional mobility PT Short Term Goal 3 - Progress (Week 1): Progressing toward goal PT Short Term Goal 4 (Week 1): Pt will propell WC >155ft with supervision assist PT Short Term Goal 4 - Progress (Week 1): Met Week 2:  PT Short Term Goal 1 (Week 2): STG=LTG due to LOS  Skilled Therapeutic Interventions/Progress Updates:   Received pt sitting in [facility] with family present at bedside and RN present attending to care. Upon therapist's entry pt's wife requested therapist return in a few minutes. Therapist returned 10 minutes later and pt agreeable to therapy and denied any pain. Per pt's family pt has not being eating or sleeping and his BP has been low; MD aware. Pt agreed to attempt to ambulate and transferred sit<>stand with RW and CGA and ambulated 80ft prior to requesting to sit due to "weakness" in LEs. After extensive rest break pt ambulated additional 28ft with RW and CGA prior to requesting to sit. Pt ambulates with narrow BOS, decreased bilateral foot clearance, and flexed trunk/downward gaze. Discussed options for getting a temporary ramp installed for safety and energy conservation purposes and pt and family all in agreement. Concluded session with pt sitting in WC, needs within reach, and seatbelt alarm on. Family and CSW  present at bedside. 11 minutes missed of skilled physical therapy due to nursing care.   Therapy Documentation Precautions:  Precautions Precautions: Fall Precaution Comments: crani Restrictions Weight Bearing Restrictions: No  Therapy/Group: Individual Therapy Alfonse Alpers PT, DPT   09/24/2020, 7:34 AM

## 2020-09-24 NOTE — Progress Notes (Signed)
Speech Language Pathology Note  Patient Details  Name: Gabriel [name] MRN: [id number] Date of Birth: [birth year]  Today's Date: 09/24/2020 SLP Individual Time: 0375-4360 SLP Individual Time Calculation (min): 45 min   Skilled Therapeutic Interventions:  Skilled SLP intervention focused on cognition. Pt completed thought organization task and explained steps to a familiar card game. He required clues from wife to direct him with including relevant details. Wife expressed concerns with dc date being tomorrow. SLP explained plan to discuss pt progress in conference and will determine if pt will benefit from an extention. MD also present at end of session to discuss possible dc plans with wife.    [name] Mangum 09/24/2020, 9:47 AM

## 2020-09-24 NOTE — Progress Notes (Signed)
Physical Therapy Session Note  Patient Details  Name: Gabriel [name] MRN: [id number] Date of Birth: 04-25-1930  Today's Date: 09/24/2020 PT Individual Time: 0277-4128 PT Individual Time Calculation (min): 59 min   Short Term Goals: Week 2:  PT Short Term Goal 1 (Week 2): STG=LTG due to LOS  Skilled Therapeutic Interventions/Progress Updates:    Pt received sitting in w/c with his wife and daughter exiting upon therapist arrival. Pt agreeable to therapy session. Donned TED hose, socks, and tennis shoes max assist for time management. Pt reports need to use bathroom. Sit>stand w/c>RW with CGA for steadying - demos good recall and motor planning of scooting hips forward and pushing up from armrests without cuing. Gait training ~24ft 2x in/out of bathroom using RW with CGA for steadying, demos good safety awareness and good AD management - standing CGA for safety while pt managed LB clothing without assist. Requires increased time to attempt toileting, but pt was unsuccessful for either, only having gas. Hand hygiene at sink with CGA for balance safety.  Transported to/from gym in w/c for time management and energy conservation. Sit<>stands w/c<>RW with CGA during session - cuing to reach back when going to sit. Gait training 45ft 2x using RW with CGA/min assist for steadying - demos improving step length and foot clearance though has kind of a "stomping" gait pattern with heavy foot landing  as well as slightly wider BOS (possibly due to LE bony alignment?) - cuing throughout for improvement. After 1st walk pt reports he has "no energy" - reinforced education on importance of increased food consumption - pt agreeable to continue session. B LE strengthening via repeated step up/down on/off 1st 6" step using B UE support on HRs - min assist for balance - pt with increased difficulty stepping up leading with L LE compared to R - x5 reps each. Transported back to room. Stand pivot w/c>recliner using RW with  CGA for steadying. Pt left seated in recliner with needs in reach and chair alarm on.  Therapy Documentation Precautions:  Precautions Precautions: Fall Precaution Comments: crani Restrictions Weight Bearing Restrictions: No  Pain:  No reports of pain throughout session.    Therapy/Group: Individual Therapy  Tawana Scale , PT, DPT, CSRS 09/24/2020, 3:09 PM

## 2020-09-24 NOTE — Progress Notes (Signed)
Wife interested in another [facility] I believe. Aware insurance will not cover. Suggested she talk to [facility] about additional equipment. Also interested in hospital bed. Aware they are expensive. Suggested adjustable platform and use current mattress or try used medical equipment.  Asked if she is going to be able to transfer pt at home. Aware NT used stedy to transfer but wife feels that was easier for her to transfer him. I informed that was not able to transfer him either last week and had to get assistance. Suggested plan for care at home as may not be able to transfer. She said that son can be there the first week but has to go back to work and daughter's work is demanding. Michela Pitcher that is going to hire help. Suggested that she go ahead and start talking to companies and get something going. She has questions about what insurance will cover. Informed that will probably have home health but the rest she may have to cover. Suggested to talk to SW also about this that has more information than I.

## 2020-09-24 NOTE — Progress Notes (Signed)
Pt ate approx 10% of dinner and drank 165ml.  Was able to get pt to eat 3 dates for constipation.

## 2020-09-24 NOTE — Progress Notes (Signed)
Occupational Therapy Session Note  Patient Details  Name: Gabriel [name] MRN: [id number] Date of Birth: 1931/03/17  Today's Date: 09/24/2020 OT Individual Time: 1003-1103 OT Individual Time Calculation (min): 60 min    Short Term Goals: Week 1:  OT Short Term Goal 1 (Week 1): Pt will complete bathing at shower level with CGA OT Short Term Goal 1 - Progress (Week 1): Met OT Short Term Goal 2 (Week 1): Pt will complete LB dressing (Including footwear) with CGA OT Short Term Goal 2 - Progress (Week 1): Partly met OT Short Term Goal 3 (Week 1): Pt will complete UB dressing with min assist OT Short Term Goal 3 - Progress (Week 1): Met OT Short Term Goal 4 (Week 1): Pt will complete 2 grooming tasks in standing at sink to demonstrate increased standing balance and tolerance OT Short Term Goal 4 - Progress (Week 1): Met Week 2:  OT Short Term Goal 1 (Week 2): STG = LTG due to LOS   Skilled Therapeutic Interventions/Progress Updates:    Pt received in room with wife at bedside and consented to OT tx. Pt seen for ADL and functional transfer retraining this session. Initially, pt required increased time and assist to sit EOB, demo's heavy R side lean during sitting and initial STS. Pt sat back down EOB to regroup, take rest break, then instructed in SPT with RW with mod A. Pt then instructed in toilet transfers and toileting tasks with mod A for sequencing and hand placement during transitions. Pt able to manage clothes with min A. After toileting, pt instructed in walk in shower transfer, req mod A and cuing for proper hand placement and foot placement throughout transition, pt notably has trouble backing up/stepping backwards during all tasks req max cuing for each step. Pt bathed with min A and CGA while standing with grab bars to wash buttocks and peri area. Pt then instructed to dry off while seated in shower, donned short with buttons with setup and increased time, and completed shower transfer  with mod A out of shower to wheelchair. Pt then taken out to room to don LB clothes, req mod A to thread feet, but was able to stand in RW with SUP to hike with min A. Pt's wife watched entire ADL, expresses that she is very thankful we did the shower and that she is more confident about how to do it at home. Pt tolerated tx well today, just required a lot of cuing for all sequencing, initiation, and steps of tasks. After tx, pt left up in w/c with all needs met and wife at bedside.   Therapy Documentation Precautions:  Precautions Precautions: Fall Precaution Comments: crani Restrictions Weight Bearing Restrictions: No   Pain: Pain Assessment Pain Scale: Faces Pain Score: 4  Faces Pain Scale: No hurt Pain Type: Chronic pain Pain Location: Back Pain Orientation: Lower;Left Pain Descriptors / Indicators: Aching;Throbbing Pain Frequency: Intermittent Pain Intervention(s): Medication (See eMAR)   Therapy/Group: Individual Therapy  Gabriel [name] 09/24/2020, 11:27 AM

## 2020-09-25 ENCOUNTER — Telehealth: Payer: Self-pay | Admitting: Internal Medicine

## 2020-09-25 MED ORDER — BOOST / RESOURCE BREEZE PO LIQD CUSTOM
1.0000 | Freq: Two times a day (BID) | ORAL | Status: DC
  Administered 2020-09-25 – 2020-09-28 (×3): 1 via ORAL

## 2020-09-25 NOTE — Progress Notes (Signed)
PROGRESS NOTE   Subjective/Complaints: Doing much better.  Denies any more gas pains.  Back pain much improved and he slept very well last night Did well with therapy this morning  ROS:  Pt denies SOB, abd pain, CP, N/V/C/D, and vision changes, pain, +diffuse weakness, gas pains have resolved, back pain improved   Objective:   DG Thoracic Spine W/Swimmers  Result Date: 09/24/2020 CLINICAL DATA:  Upper back pain for 1 day EXAM: THORACIC SPINE - 3 VIEWS COMPARISON:  PET-CT from 08/22/2020 FINDINGS: There are changes in the lingula consistent with the known lingular mass seen on prior PET-CT. Vertebral body height is well maintained. No paraspinal mass is noted. Very mild osteophytic changes are seen. Pedicles are within normal limits. IMPRESSION: Changes consistent with the known lingular mass. Mild degenerative change of the thoracic spine without acute abnormality. Electronically Signed   By: Inez Catalina M.D.   On: 09/24/2020 19:06   No results for input(s): WBC, HGB, HCT, PLT in the last 72 hours.   No results for input(s): NA, K, CL, CO2, GLUCOSE, BUN, CREATININE, CALCIUM in the last 72 hours.    Intake/Output Summary (Last 24 hours) at 09/25/2020 1155 Last data filed at 09/25/2020 0445 Gross per 24 hour  Intake 245 ml  Output 475 ml  Net -230 ml        Physical Exam: Vital Signs Blood pressure 117/80, pulse 66, temperature 97.6 F (36.4 C), temperature source Oral, resp. rate 16, weight 83.9 kg, SpO2 99 %. Gen: no distress, normal appearing HEENT: oral mucosa pink and moist, NCAT Cardio: Reg rate Chest: normal effort, normal rate of breathing Abd: soft, non-distended Ext: no edema Psych: pleasant, normal affect Skin: intact  Musculoskeletal:     Cervical back: Normal range of motion and neck supple.     Comments: RUE- 5/5 except finger abd 5-/5 LUE- 5/5 except finger abd 4+/5 RLE- HF 4+/5, KE 4/5, DF and PF  4-/5 LLE- HF 4-/5, KE 4-/5, DF 3-/5 and PF 4/5 --mildly improved ankle motion +tenderness to palpation over thoracic paraspinal Skin:    General: Skin is warm and dry. Crani incision CDI with staples  Neurological:     Comments: alert. Follows basic commands. Fair insight and awareness. Can be tangential. Delayed processing L inattention noted on exam Intact to light touch in all 4 extremities-   Assessment/Plan: 1. Functional deficits which require 3+ hours per day of interdisciplinary therapy in a comprehensive inpatient rehab setting. Physiatrist is providing close team supervision and 24 hour management of active medical problems listed below. Physiatrist and rehab team continue to assess barriers to discharge/monitor patient progress toward functional and medical goals  Care Tool:  Bathing    Body parts bathed by patient: Right arm, Left arm, Chest, Abdomen, Front perineal area, Right upper leg, Left upper leg, Face   Body parts bathed by helper: Buttocks, Left lower leg, Right lower leg Body parts n/a: Right upper leg, Left upper leg, Right lower leg, Left lower leg (Did not complete due to time constraint)   Bathing assist Assist Level: Minimal Assistance - Patient > 75%     Upper Body Dressing/Undressing Upper body dressing  What is the patient wearing?: Button up shirt    Upper body assist Assist Level: Set up assist    Lower Body Dressing/Undressing Lower body dressing      What is the patient wearing?: Underwear/pull up, Pants     Lower body assist Assist for lower body dressing: Moderate Assistance - Patient 50 - 74%     Toileting Toileting    Toileting assist Assist for toileting: Moderate Assistance - Patient 50 - 74%     Transfers Chair/bed transfer  Transfers assist     Chair/bed transfer assist level: Contact Guard/Touching assist Chair/bed transfer assistive device: Walker, Clinical biochemist   Ambulation assist       Assist level: Contact Guard/Touching assist Assistive device: Walker-rolling Max distance: 43ft   Walk 10 feet activity   Assist     Assist level: Contact Guard/Touching assist Assistive device: Walker-rolling   Walk 50 feet activity   Assist Walk 50 feet with 2 turns activity did not occur: Safety/medical concerns  Assist level: Contact Guard/Touching assist Assistive device: Walker-rolling    Walk 150 feet activity   Assist Walk 150 feet activity did not occur: Safety/medical concerns  Assist level: Contact Guard/Touching assist Assistive device: Walker-rolling    Walk 10 feet on uneven surface  activity   Assist Walk 10 feet on uneven surfaces activity did not occur: Safety/medical concerns         Wheelchair     Assist Will patient use wheelchair at discharge?: No Type of Wheelchair: Manual    Wheelchair assist level: Supervision/Verbal cueing Max wheelchair distance: 134ft    Wheelchair 50 feet with 2 turns activity    Assist        Assist Level: Supervision/Verbal cueing   Wheelchair 150 feet activity     Assist      Assist Level: Supervision/Verbal cueing   Blood pressure 117/80, pulse 66, temperature 97.6 F (36.4 C), temperature source Oral, resp. rate 16, weight 83.9 kg, SpO2 99 %.  Medical Problem List and Plan: 1.  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  Status post stereotactic right temporal craniotomy resection of brain metastasis 09/03/2020. Has small 71mm Right frontal mass as well  Follow-up medical oncology as well as radiation oncology with radiation therapy as directed.  Continue Decadron protocol             -patient may shower once staples out             -ELOS/Goals: 12-16 days supervision  -Continue PT, OT and SLP    -After discussion with palliative care patient and wife have decided on DNR status  -provided dietary education to maximize benefits from outpatient immunotherapy.  2.   Antithrombotics: -DVT/anticoagulation: SCDs.             -antiplatelet therapy: N/A 3. Thoracic spinal pain: Lidocaine patch ordered. Added Norco PRN. Improved.  4. Situational anxiety: Discontinue Ativan 0.25- no need for scans.              -antipsychotic agents: N/A 5. Neuropsych: This patient is capable of making decisions on his own behalf. 6. Skin/Wound Care: Routine skin checks. Staples d/ced on 6/14- nursing order placed. Site is c/d/i 7. Fluids/Electrolytes/Nutrition: Routine in and outs with follow-up chemistries 8.  Seizure prophylaxis.  d/c Keppra, completed course 9.  Hypertension.  d/c anti-hypertensives. Resolved. Now well controlled off medications: discussed with wife use of TEDs at home if hypotensive, but appears to be stable.  10.  Hyperlipidemia.  Continue Lipitor Vitals:   09/24/20 1916 09/25/20 0444  BP: 106/73 117/80  Pulse: 88 66  Resp: 18 16  Temp: 98.2 F (36.8 C) 97.6 F (36.4 C)  SpO2: 99% 99%   11.  Constipation.  Continue Senokot twice daily, Colace twice daily, Dulcolax suppository daily as needed. Received sorbitol once.  12. Hemorrhoids: bleeding, painless, discussed hgb stable, repeat Monday 13. Increased weakness/slowed response: Head CT normal. Labs stable. UA/UC normal. Cr better today Magnesium normal 14. AKI: now resolved, could have been contributing to worsening function: D/c HCTZ and lisinopril.  15. Hypomagnesium: supplemented with 2g IV 6/18  Has also helped with BP after stopping two anti-hypertensives.  16. Severe vitamin D deficiency: Ergocalciferol 50,000U weekly x7 weeks 17. Insomnia: Trazodone 100mg  HS PRN ordered  18. Gas: resolved with cessation of Ensure.  19. CODE STATUS: DNR: palliative care involved, recommend cont palliative care as OP 20. Disposition: HFU scheduled with me on 11/22/20 at 2pm. D/c extended 1 week. Called wife to update her.   LOS: 15 days A FACE TO FACE EVALUATION WAS PERFORMED  Clide Deutscher  Shay Jhaveri 09/25/2020, 11:55 AM

## 2020-09-25 NOTE — Telephone Encounter (Signed)
Called pt to r/s appt per 6/22 sch msg. No answer and vm was full. Will try to contact again tomorrow.

## 2020-09-25 NOTE — Progress Notes (Signed)
Occupational Therapy Session Note  Patient Details  Name: Gabriel [name] MRN: [id number] Date of Birth: [birth year]  Today's Date: 09/25/2020 OT Individual Time: 0902-1000 OT Individual Time Calculation (min): 58 min    Short Term Goals: Week 1:  OT Short Term Goal 1 (Week 1): Pt will complete bathing at shower level with CGA OT Short Term Goal 1 - Progress (Week 1): Met OT Short Term Goal 2 (Week 1): Pt will complete LB dressing (Including footwear) with CGA OT Short Term Goal 2 - Progress (Week 1): Partly met OT Short Term Goal 3 (Week 1): Pt will complete UB dressing with min assist OT Short Term Goal 3 - Progress (Week 1): Met OT Short Term Goal 4 (Week 1): Pt will complete 2 grooming tasks in standing at sink to demonstrate increased standing balance and tolerance OT Short Term Goal 4 - Progress (Week 1): Met Week 2:  OT Short Term Goal 1 (Week 2): STG = LTG due to LOS  Skilled Therapeutic Interventions/Progress Updates:    Pt received in room in recliner with son present and consented to OT tx. Therapist discussed with son pt's home environment and shower setup, made recommendations for grab bar placements in shower as well as suggestions for a tub transfer bench if walk in shower step becomes too difficult for pt to step up over. Pt wheeled down to therapy gym for WIS step training, with cuing for proper RW placement. According to son, pt will have plenty of grab bars in the shower, practiced transfer with RW to practice clearing foot over step. Pt able to complete with CGA-min A. Pt then instructed in standing tolerance activity at table with matching card game. Pt able to stand for ~3.5 minutes x3 before requiring extensive seated rest breaks due to fatigue. Pt required cuing to reach back for w/c before sitting, as pt becomes too fatigued and plops back in w/c each time. Educated pt to sit before be becomes too tired to safely sit down, pt verbalized understanding. Also educated in  head-hips ratio and to lean forward when standing with pt demo'ing good carryover. Remainder of session utilized to instruct in 2# dowel rod BUE strengthening HEP to increase strength and activity tolerance for ADLs and functional transfers. Instructed pt in elbow flexion, shoulder press, and chest press for 3x15 with min cuing for proper technique with good carryover. After tx, pt handed off to PT in ortho gym.   Therapy Documentation Precautions:  Precautions Precautions: Fall Precaution Comments: crani Restrictions Weight Bearing Restrictions: No  Vital Signs: Therapy Vitals Temp: 97.6 F (36.4 C) Temp Source: Oral Pulse Rate: 66 Resp: 16 BP: 117/80 Patient Position (if appropriate): Lying Oxygen Therapy SpO2: 99 % O2 Device: Room Air Pain: none      Therapy/Group: Individual Therapy  Katleen Carraway 09/25/2020, 7:34 AM

## 2020-09-25 NOTE — Progress Notes (Signed)
Physical Therapy Weekly Progress Note  Patient Details  Name: Gabriel [name] MRN: [id number] Date of Birth: 1930/10/08  Beginning of progress report period: September 11, 2020 End of progress report period: September 25, 2020  Today's Date: 09/25/2020 PT Individual Time: 1000-1056 PT Individual Time Calculation (min): 56 min   Patient has met 1 of 10 long term goals. Pt's LOS has recently been extended due to functional decline resulting in pt transitioning from CGA to min A depending on fatigue. Pt is having difficulty sleeping, c/o increased back pain, and demonstrates decreased appetite all impacting energy/strength levels for therapy sessions. Pt is currently able to perform bed mobility with supervision/mod A depending on fatigue, transfer with RW and CGA/min A, and ambulate up to 77f at once with RW and min A.   Patient continues to demonstrate the following deficits muscle weakness, decreased cardiorespiratoy endurance, decreased coordination, and decreased standing balance, decreased postural control, and decreased balance strategies and therefore will continue to benefit from skilled PT intervention to increase functional independence with mobility.  Patient progressing toward long term goals..  Continue plan of care.  PT Short Term Goals Week 2:  PT Short Term Goal 1 (Week 2): STG=LTG due to LOS Week 3:  PT Short Term Goal 1 (Week 3): STG=LTG due to extended LOS  Skilled Therapeutic Interventions/Progress Updates:  Ambulation/gait training;Community reintegration;DME/adaptive equipment instruction;Neuromuscular re-education;Psychosocial support;Stair training;UE/LE Strength taining/ROM;Wheelchair propulsion/positioning;Balance/vestibular training;Discharge planning;Functional electrical stimulation;Pain management;Skin care/wound management;Therapeutic Activities;UE/LE Coordination activities;Cognitive remediation/compensation;Disease management/prevention;Functional mobility  training;Splinting/orthotics;Patient/family education;Therapeutic Exercise;Visual/perceptual remediation/compensation   Today's Interventions: Received pt sitting in WC in ortho gym; handoff from OT. Pt agreeable to PT treatment and denied any pain during session. Session with emphasis on functional mobility/transfers, generalized strengthening, simulated car transfers, dynamic standing balance/coordination, ambulation, and improved activity tolerance. Pt performed simulated car transfer with RW and min A with assist to get LEs into car. Pt initially with L lateral lean and required mod cues for scooting and anterior weight shifting to reposition in car. Pt with SOB afterwards and required extensive rest break prior to getting out. Pt transported to dayroom and worked on side stepping with RW to simulate bathroom entry at home. Pt able to take 5 steps to R and 2 steps to L with CGA prior to requesting to sit. Attempted again and pt side stepped L along length of table in dayroom but with increased difficulty stepping to R, getting RW stuck under table. Pt required min cues to step backwards then reported sudden urge to sit resulting in controlled posterior descent into WC with mod A despite cues to reach back for WC armrests. Discussed recognizing signs/symptoms of fatigue and importance of sitting down before feeling like his "legs are giving out". [facility] performed the following [facility] and [facility] for technique and for counting: -hip flexion 2x10 bilaterally -LAQ 2x10 bilaterally -hip adduction ball squeezes 2x10 -bicep curls with 1lb 2x12 -horizontal chest press with 1lb dowel 2x10 -overhead shoulder press with 1lb dowel 2x8 Pt transported [facility] and ambulated 196fwith RW and CGA to recliner. Concluded session with pt sitting in recliner, needs within reach, and chair pad alarm on.   Therapy Documentation Precautions:  Precautions Precautions:  Fall Precaution Comments: crani Restrictions Weight Bearing Restrictions: No  Therapy/Group: Individual Therapy AnAlfonse AlpersT, DPT  09/25/2020, 7:22 AM

## 2020-09-25 NOTE — Progress Notes (Signed)
Occupational Therapy Weekly Progress Note  Patient Details  Name: Gabriel [name] MRN: [id number] Date of Birth: 13-Jun-1930  Beginning of progress report period: September 18, 2020 End of progress report period: September 25, 2020    Patient continues to demonstrate the following deficits: muscle weakness, decreased cardiorespiratoy endurance, and decreased standing balance and therefore will continue to benefit from skilled OT intervention to enhance overall performance with BADL, iADL, and Reduce care partner burden.  Patient progressing toward long term goals..  Continue plan of care.  OT Short Term Goals Week 1:  OT Short Term Goal 1 (Week 1): Pt will complete bathing at shower level with CGA OT Short Term Goal 1 - Progress (Week 1): Met OT Short Term Goal 2 (Week 1): Pt will complete LB dressing (Including footwear) with CGA OT Short Term Goal 2 - Progress (Week 1): Partly met OT Short Term Goal 3 (Week 1): Pt will complete UB dressing with min assist OT Short Term Goal 3 - Progress (Week 1): Met OT Short Term Goal 4 (Week 1): Pt will complete 2 grooming tasks in standing at sink to demonstrate increased standing balance and tolerance OT Short Term Goal 4 - Progress (Week 1): Met Week 2:  OT Short Term Goal 1 (Week 2): STG = LTG due to LOS Week 3:  OT Short Term Goal 1 (Week 3): STGs=LTGs due to extended LOS  Skilled Therapeutic Interventions/Progress Updates:    Pt initially met STGs and is progressing towards LTGs. Pt's LOS has been extended as pt began to decline in progress, but is now showing more improvement during functional tasks. Pt's family has been present for family training and wife present for ADL shower to understand pt's overall CLOF. Pt will continue to benefit from skilled OT to maximize safety and independence with ADLs and functional transfers to prep for safe discharge home. Pt has very supportive family, son reports grab bars are being put in bathroom and pt will have all  bathroom equipment needed upon return home. Continue POC.  Therapy Documentation Precautions:  Precautions Precautions: Fall Precaution Comments: crani Restrictions Weight Bearing Restrictions: No  Pain: none     Therapy/Group: Individual Therapy  Myiah Petkus 09/25/2020, 9:03 AM

## 2020-09-25 NOTE — Progress Notes (Signed)
Speech Language Pathology Daily Session Note  Patient Details  Name: Gabriel [name] MRN: [id number] Date of Birth: 1931/02/06  Today's Date: 09/25/2020 SLP Individual Time: 1430-1500 SLP Individual Time Calculation (min): 30 min  Short Term Goals: Week 1: SLP Short Term Goal 1 (Week 1): Pt will demonstrate sustained attention in 5-7 minute intervals with supervision A verbal cues, SLP Short Term Goal 1 - Progress (Week 1): Met SLP Short Term Goal 2 (Week 1): Pt will demonstrate basic problem solving skills in familiar tasks with min A verbal cues. SLP Short Term Goal 2 - Progress (Week 1): Met SLP Short Term Goal 3 (Week 1): Pt will demonstrate self-monitoring in basic problem solving tasks with max A verbal cues. SLP Short Term Goal 3 - Progress (Week 1): Met SLP Short Term Goal 4 (Week 1): Pt will demonstrate recall of novel, functional information with mod A verbal cues for compensatory aids. SLP Short Term Goal 4 - Progress (Week 1): Progressing toward goal  Skilled Therapeutic Interventions:   Patient seen for skilled ST session focusing on cognitive function goals. He was able to recall names of therapists who worked with him today without looking at schedule and able to recall and describe exercises/therapy activities completed. He sustained attention during conversation and novel task with supervision to intermittent A and demonstrated good problem solving with supervision to minA cues. Patient continues to benefit from skilled SLP intervention to maximize cognitive function goals prior to discharge.  Pain Pain Assessment Pain Scale: 0-10 Pain Score: 0-No pain  Therapy/Group: Individual Therapy  Sonia Baller, MA, CCC-SLP Speech Therapy

## 2020-09-25 NOTE — Progress Notes (Signed)
Physical Therapy Session Note  Patient Details  Name: Gabriel [name] MRN: [id number] Date of Birth: 06-07-1930  Today's Date: 09/25/2020 PT Individual Time: 800-830 PT minutes :  30 min   Short Term Goals: Week 2:  PT Short Term Goal 1 (Week 2): STG=LTG due to LOS  Skilled Therapeutic Interventions/Progress Updates: Pt presents supine in bed and agreeable to therapy.  Pt transfers supine to sit w/ min A and verbal cues for sequencing.  Pt sat EOB and lifted LEs to don shoes.  Pt transferred sit to stand w/ CGA from bed w/o further cueing.  Pt performed step-pivot to recliner w/ RW and CGA.  Pt then perfmed multiple sit to stand transfers w/ CGA to RW.  Pt initiating well w/o any cues from PT, other than "Stand up".  Son present during session.  Pt performed 2 trials of amb forward x 6' and then backward to seat.  Pt initially steps back w/ appropriate steps then begins marching in place, but then resumes w/ cueing.  Pt requires verbal cues for increased step length and foot clearance on L.  Pt performed stepping forward w/ HHA of PT in front and then attempted stepping backwards, but unable.  Recliner brought up for pt to sit.  Pt fatigued.  Seat alarm on and all needs in reach w/ son present.     Therapy Documentation Precautions:  Precautions Precautions: Fall Precaution Comments: crani Restrictions Weight Bearing Restrictions: No General:   Vital Signs:   Pain:0/10 Pain Assessment Pain Scale: 0-10 Pain Score: 0-No pain     Therapy/Group: Individual Therapy  [name] 09/25/2020, 12:23 PM

## 2020-09-25 NOTE — Progress Notes (Signed)
Initial Nutrition Assessment  DOCUMENTATION CODES:   Not applicable  INTERVENTION:  Provide Boost Breeze po BID, each supplement provides 250 kcal and 9 grams of protein.  Encourage adequate PO intake.   NUTRITION DIAGNOSIS:   Increased nutrient needs related to cancer and cancer related treatments as evidenced by estimated needs.  GOAL:   Patient will meet greater than or equal to 90% of their needs  MONITOR:   PO intake, Supplement acceptance, Skin, Weight trends, Labs, I & O's  REASON FOR ASSESSMENT:   Malnutrition Screening Tool    ASSESSMENT:   87 year old male with history of hyperlipidemia, hypertension, CVA. Patient recently diagnosed stage IV melanoma findings of large lung mass. CT scan revealed left upper lobe mass. Renal masses concerning for primary renal disease versus metastatic disease.  MRI of the brain demonstrated 3 intracranial metastases including 2 small subcentimeter metastases and a larger right hemorrhagic lesion. PET scan completed showing hypermetabolic lingular mass with central necrosis consistent with primary bronchogenic carcinoma. Patient underwent stereotactic right temporal craniotomy for resection of brain metastasis. Surgical pathology report consistent with metastatic melanoma. Pt with decreased functional mobility admitted to CIR.  Family at bedside report pt with decreased po and appetite over the past 1 week. Meal completion 10%. Family has been encouraging pt po intake and has been bringing in food from outside/home to aid in intake. Family reports prior to the past 1 week, pt appetite and intake was good with most po of 80-100%. RD to order nutritional supplements to aid in caloric and protein needs. Pt reports Ensure causes abdominal discomfort and gas. RD to order Boost Breeze instead. Pt with no significant weight loss per weight records. Pt encouraged to eat his food at meals and to drink his supplements.   NUTRITION - FOCUSED PHYSICAL  EXAM:  Flowsheet Row Most Recent Value  Orbital Region Unable to assess  Upper Arm Region No depletion  Thoracic and Lumbar Region No depletion  Buccal Region Unable to assess  Temple Region Unable to assess  Clavicle Bone Region Mild depletion  Clavicle and Acromion Bone Region Mild depletion  Scapular Bone Region Unable to assess  Dorsal Hand Unable to assess  Patellar Region Moderate depletion  Anterior Thigh Region Moderate depletion  Posterior Calf Region Moderate depletion  Edema (RD Assessment) Mild  Hair Reviewed  Eyes Reviewed  Mouth Reviewed  Skin Reviewed  Nails Reviewed      Labs and medications reviewed.   Diet Order:   Diet Order             Diet Carb Modified Fluid consistency: Thin; Room service appropriate? Yes  Diet effective now                   EDUCATION NEEDS:   Not appropriate for education at this time  Skin:  Skin Assessment: Skin Integrity Issues: Skin Integrity Issues:: Incisions Incisions: head  Last BM:  6/19  Height:   Ht Readings from Last 1 Encounters:  09/03/20 5\' 10"  (1.778 m)    Weight:   Wt Readings from Last 1 Encounters:  09/10/20 83.9 kg   BMI:  Body mass index is 26.54 kg/m.  Estimated Nutritional Needs:   Kcal:  7124-5809  Protein:  90-105 grams  Fluid:  >/= 1.9 L/day  Corrin Parker, MS, RD, LDN RD pager number/after hours weekend pager number on Am[employer].

## 2020-09-26 ENCOUNTER — Telehealth: Payer: Self-pay | Admitting: Internal Medicine

## 2020-09-26 NOTE — Progress Notes (Signed)
Recreational Therapy Session Note  Patient Details  Name: Gabriel [name] MRN: [id number] Date of Birth: December 16, 1930 Today's Date: 09/26/2020   Pt with low activity tolerance and is will no longer be followed by TR.  Pt does have leisure supplies in his room from home for use as needed.  Will continue to monitor through team. Eutha Cude 09/26/2020, 3:39 PM

## 2020-09-26 NOTE — Progress Notes (Signed)
Speech Language Pathology Weekly Progress and Session Note  Patient Details  Name: Gabriel [name] MRN: [id number] Date of Birth: [birth year]  Beginning of progress report period: September 17, 2020 End of progress report period: September 26, 2020  Today's Date: 09/26/2020 SLP Individual Time: 1300-1330 SLP Individual Time Calculation (min): 30 min  Short Term Goals: Week 2: SLP Short Term Goal 1 (Week 2): Pt will demonstrate alternating attention in 5-7 minute intervals with supervision A verbal cues, SLP Short Term Goal 1 - Progress (Week 2): Met SLP Short Term Goal 2 (Week 2): Pt will demonstrate mildyl complex problem solving skills in familiar tasks with mod A verbal cues. SLP Short Term Goal 2 - Progress (Week 2): Met SLP Short Term Goal 3 (Week 2): Pt will demonstrate self-monitoring in basic problem solving tasks with min A verbal cues. SLP Short Term Goal 3 - Progress (Week 2): Met SLP Short Term Goal 4 (Week 2): Pt will demonstrate recall of novel, functional information with mod A verbal cues for compensatory aids. SLP Short Term Goal 4 - Progress (Week 2): Met   New Short Term Goals: Week 3: SLP Short Term Goal 1 (Week 3): Pt will demonstrate mildy complex problem solving skills in familiar tasks with min A verbal cues. SLP Short Term Goal 2 (Week 3): Pt will demonstrate self-monitoring in basic problem solving tasks with Sup A verbal cues SLP Short Term Goal 3 (Week 3): Pt will demonstrate recall of novel, functional information with min A verbal cues for compensatory aids.  Weekly Progress Updates: Patient has made consistent gains and has met 4/4 STG's this reporting period due to improved attention, problem solving, and short-term recall. Currently, patient continues to require min-to-mod assist for problem solving tasks, self monitoring, and recall. He demonstrates good carry over of skill during session and is receptive to feedback. Education ongoing with family who plans to provide  24/7 supervision at discharge. Patient would benefit from continued SLP intervention to maximize cognitive function and independence prior to discharge.   Intensity: Minumum of 1-2 x/day, 30 to 90 minutes Frequency: 3 to 5 out of 7 days Duration/Length of Stay: 6/29 Treatment/Interventions: Cognitive remediation/compensation;Cueing hierarchy;Functional tasks;Internal/external aids;Patient/family education  Daily Session  Skilled Therapeutic Interventions: Skilled ST intervention performed with focus on cognitive goals. Patient sustained attention during functional task with min A verbal cues. SLP facilitated medication management task with with Mod A verbal and visual cues. Patient identified organization errors within weekly am/pillbox with 50% accuracy progressing to 80% accuracy with Mod verbal cues. Generated appropriate solutions with 75% accuracy and Min A verbal cues. Patient appeared sleepy today which may have impacted performance. Patient was left in chair with alarm activated and needs within reach. Continue per ST POC.  General    Pain Pain Assessment Pain Scale: 0-10 Pain Score: 0-No pain  Therapy/Group: Individual Therapy  Patty Sermons 09/26/2020, 5:16 PM

## 2020-09-26 NOTE — Progress Notes (Signed)
Physical Therapy Session Note  Patient Details  Name: Gabriel [name] MRN: [id number] Date of Birth: Sep 22, 1930  Today's Date: 09/26/2020 PT Individual Time: 4193-7902 and 4097-3532 and 9924-2683 PT Individual Time Calculation (min): 54 min and 38 min and 24 min  Short Term Goals: Week 2:  PT Short Term Goal 1 (Week 2): STG=LTG due to LOS Week 3:  PT Short Term Goal 1 (Week 3): STG=LTG due to extended LOS  Skilled Therapeutic Interventions/Progress Updates:   Treatment Session 1 Received pt semi-reclined in bed, pt agreeable to PT treatment, and denied any pain during session and reported having a "good night" last night. Session with focus on functional mobility/transfers, generalized strengthening, dynamic sitting and standing balance/coordination, gait training, and improved activity tolerance. Pt requested to sit up and eat breakfast and transferred semi-reclined<>sitting EOB with HOB elevated and use of bedrails with min A. Donned shoes sitting EOB with max A and pt able to maintain static sitting balance with heavy BUE support fading to no UE support and supervision for 11 minutes while eating breakfast. Pt with increased R lateral lean with fatigue but able to correct with cues. Scooted to EOB with supervision and increased time and required 4 attempts and mod A to stand from elevated bed and transfer into WC using RW. Cued pt for anterior weight shifting and hand placement on bed and on RW. Pt sat in [facility] and brushed teeth and combed hair at sink with set up assist; cues to use mirror to correct R lateral lean. Sit<>stand with RW from [facility] with CGA and ambulated 56ft x 1 and 27ft x 1 with RW and CGA. Pt demonstrated decreased cadence, flexed trunk, narrow BOS, decreased step/stride length, and decreased bilateral foot clearance. Concluded session with pt sitting in recliner, needs within reach, and chair pad alarm on. RN present at bedside administering medications.   Treatment Session  2 Received pt sitting in WC asleep, pt easily woken and agreeable to PT treatment, and denied any pain during session but reported feeling fatigued. Session with focus on functional mobility/transfers, generalized strengthening, dynamic standing balance/coordination, and improved activity tolerance. Pt transported to 4W dayroom in [facility] total A for time management and energy conservation purposes. Pt performed x 2 stand<>pivot transfers throughout session with RW and CGA with no cues from therapist. Worked on dynamic standing balance tossing horseshoes with RUE x 4 trials with CGA/min A for balance with emphasis on standing tolerance and upright posture/gaze. Pt performed BUE strengthening on UBE at level 0 for 2 minutes forward and 2 minutes backwards with emphasis on endurance and UE strengthening. Pt transported back to room in [facility] total A and ambulated 69ft with RW and CGA to recliner. Concluded session with pt sitting in recliner, needs within reach, and chair pad alarm on.  Treatment Session 3  Received pt sitting in recliner asleep, pt easily woken and agreeable to PT treatment, and denied any pain during session. Session with focus on functional mobility/transfers, generalized strengthening, dynamic standing balance/coordination, and improved activity tolerance. Goal to work on gait training during session however pt required max A and max cues with increased time to sequencing just scooting to edge of recliner due to fatigue. Pt required 2 attempts and heavy mod A to stand with RW. Pt reported "having no energy" and being up since 8am. Educated pt on importance of resting in between therapies to sustain energy to make it all day and encouraged pt to return to bed to rest prior to his  wife returning for dinner. Stand<>pivot recliner<>bed with CGA and sit<>supine with min A and max cues to achieve midline position in bed. Concluded session with pt supine in bed, needs within reach, and bed alarm on.    Therapy Documentation Precautions:  Precautions Precautions: Fall Precaution Comments: crani Restrictions Weight Bearing Restrictions: No  Therapy/Group: Individual Therapy Alfonse Alpers PT, DPT   09/26/2020, 7:15 AM

## 2020-09-26 NOTE — Telephone Encounter (Signed)
Scheduled appointment per 06/23 sch msg. Patient is aware.

## 2020-09-26 NOTE — Progress Notes (Signed)
PROGRESS NOTE   Subjective/Complaints: No complaints this morning Denies gas Sleeping better Denies back pain  ROS:  Pt denies SOB, abd pain, CP, N/V/C/D, and vision changes, pain, +diffuse weakness, gas pains have resolved, back pain improved, insomnia has improved   Objective:   DG Thoracic Spine W/Swimmers  Result Date: 09/24/2020 CLINICAL DATA:  Upper back pain for 1 day EXAM: THORACIC SPINE - 3 VIEWS COMPARISON:  PET-CT from 08/22/2020 FINDINGS: There are changes in the lingula consistent with the known lingular mass seen on prior PET-CT. Vertebral body height is well maintained. No paraspinal mass is noted. Very mild osteophytic changes are seen. Pedicles are within normal limits. IMPRESSION: Changes consistent with the known lingular mass. Mild degenerative change of the thoracic spine without acute abnormality. Electronically Signed   By: Inez Catalina M.D.   On: 09/24/2020 19:06   No results for input(s): WBC, HGB, HCT, PLT in the last 72 hours.   No results for input(s): NA, K, CL, CO2, GLUCOSE, BUN, CREATININE, CALCIUM in the last 72 hours.    Intake/Output Summary (Last 24 hours) at 09/26/2020 1339 Last data filed at 09/26/2020 0700 Gross per 24 hour  Intake 720 ml  Output 625 ml  Net 95 ml        Physical Exam: Vital Signs Blood pressure 117/69, pulse 73, temperature 98.4 F (36.9 C), resp. rate 16, weight 83.9 kg, SpO2 98 %. Gen: no distress, normal appearing HEENT: oral mucosa pink and moist, NCAT Cardio: Reg rate Chest: normal effort, normal rate of breathing Abd: soft, non-distended Ext: no edema Psych: pleasant, normal affect Musculoskeletal:     Cervical back: Normal range of motion and neck supple.     Comments: RUE- 5/5 except finger abd 5-/5 LUE- 5/5 except finger abd 4+/5 RLE- HF 4+/5, KE 4/5, DF and PF 4-/5 LLE- HF 4-/5, KE 4-/5, DF 3-/5 and PF 4/5 --mildly improved ankle  motion +tenderness to palpation over thoracic paraspinal Skin:    General: Skin is warm and dry. Crani incision CDI with staples  Neurological:     Comments: alert. Follows basic commands. Fair insight and awareness. Can be tangential. Delayed processing L inattention noted on exam Intact to light touch in all 4 extremities-   Assessment/Plan: 1. Functional deficits which require 3+ hours per day of interdisciplinary therapy in a comprehensive inpatient rehab setting. Physiatrist is providing close team supervision and 24 hour management of active medical problems listed below. Physiatrist and rehab team continue to assess barriers to discharge/monitor patient progress toward functional and medical goals  Care Tool:  Bathing    Body parts bathed by patient: Right arm, Left arm, Chest, Abdomen, Front perineal area, Right upper leg, Left upper leg, Face   Body parts bathed by helper: Buttocks, Left lower leg, Right lower leg Body parts n/a: Right upper leg, Left upper leg, Right lower leg, Left lower leg (Did not complete due to time constraint)   Bathing assist Assist Level: Minimal Assistance - Patient > 75%     Upper Body Dressing/Undressing Upper body dressing   What is the patient wearing?: Button up shirt    Upper body assist Assist Level: Set up  assist    Lower Body Dressing/Undressing Lower body dressing      What is the patient wearing?: Underwear/pull up, Pants     Lower body assist Assist for lower body dressing: Moderate Assistance - Patient 50 - 74%     Toileting Toileting    Toileting assist Assist for toileting: Moderate Assistance - Patient 50 - 74%     Transfers Chair/bed transfer  Transfers assist     Chair/bed transfer assist level: Contact Guard/Touching assist Chair/bed transfer assistive device: Walker, Clinical biochemist   Ambulation assist      Assist level: Contact Guard/Touching assist Assistive device:  Walker-rolling Max distance: 79ft   Walk 10 feet activity   Assist     Assist level: Contact Guard/Touching assist Assistive device: Walker-rolling   Walk 50 feet activity   Assist Walk 50 feet with 2 turns activity did not occur: Safety/medical concerns  Assist level: Contact Guard/Touching assist Assistive device: Walker-rolling    Walk 150 feet activity   Assist Walk 150 feet activity did not occur: Safety/medical concerns  Assist level: Contact Guard/Touching assist Assistive device: Walker-rolling    Walk 10 feet on uneven surface  activity   Assist Walk 10 feet on uneven surfaces activity did not occur: Safety/medical concerns         Wheelchair     Assist Will patient use wheelchair at discharge?: No Type of Wheelchair: Manual    Wheelchair assist level: Supervision/Verbal cueing Max wheelchair distance: 156ft    Wheelchair 50 feet with 2 turns activity    Assist        Assist Level: Supervision/Verbal cueing   Wheelchair 150 feet activity     Assist      Assist Level: Supervision/Verbal cueing   Blood pressure 117/69, pulse 73, temperature 98.4 F (36.9 C), resp. rate 16, weight 83.9 kg, SpO2 98 %.  Medical Problem List and Plan: 1.  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  Status post stereotactic right temporal craniotomy resection of brain metastasis 09/03/2020. Has small 67mm Right frontal mass as well  Follow-up medical oncology as well as radiation oncology with radiation therapy as directed.  Continue Decadron protocol             -patient may shower once staples out             -ELOS/Goals: 12-16 days supervision  -Continue PT, OT and SLP    -After discussion with palliative care patient and wife have decided on DNR status  -provided dietary education to maximize benefits from outpatient immunotherapy.  2.  Antithrombotics: -DVT/anticoagulation: SCDs.             -antiplatelet therapy: N/A 3. Thoracic spinal  pain: Lidocaine patch ordered. Added Norco PRN. Improved.  4. Situational anxiety: Discontinue Ativan 0.25- no need for scans.              -antipsychotic agents: N/A 5. Neuropsych: This patient is capable of making decisions on his own behalf. 6. Skin/Wound Care: Routine skin checks. Staples d/ced on 6/14- nursing order placed. Site is c/d/i 7. Fluids/Electrolytes/Nutrition: Routine in and outs with follow-up chemistries 8.  Seizure prophylaxis.  d/c Keppra, completed course 9.  Hypertension.  d/c anti-hypertensives. Resolved. Now well controlled off medications: discussed with wife use of TEDs at home if hypotensive, but appears to be stable.  10.  Hyperlipidemia.  Continue Lipitor Vitals:   09/25/20 1954 09/26/20 0543  BP: 115/70 117/69  Pulse: 73 73  Resp: 16 16  Temp: 98 F (36.7 C) 98.4 F (36.9 C)  SpO2: 98% 98%   11.  Constipation.  Continue Senokot twice daily, Colace twice daily, Dulcolax suppository daily as needed. Received sorbitol once.  12. Hemorrhoids: bleeding, painless, discussed hgb stable, repeat Monday 13. Increased weakness/slowed response: Head CT normal. Labs stable. UA/UC normal. Cr better. Magnesium normalized 14. AKI: now resolved, could have been contributing to worsening function: D/c HCTZ and lisinopril.  15. Hypomagnesium: supplemented with 2g IV 6/18  Has also helped with BP after stopping two anti-hypertensives.  16. Severe vitamin D deficiency: Continue Ergocalciferol 50,000U weekly x7 weeks 17. Insomnia: Continue Trazodone 100mg  HS PRN  18. Gas: resolved with cessation of Ensure.  19. CODE STATUS: DNR: palliative care involved, recommend cont palliative care as OP 20. Disposition: HFU scheduled with me on 11/22/20 at 2pm. D/c extended 1 week. Updated wife and son.  LOS: 16 days A FACE TO FACE EVALUATION WAS PERFORMED  Clide Deutscher Joran Kallal 09/26/2020, 1:39 PM

## 2020-09-26 NOTE — Progress Notes (Deleted)
Speech Language Pathology Daily Session Note  Patient Details  Name: Gabriel [name] MRN: [id number] Date of Birth: [birth year]  Today's Date: 09/26/2020 SLP Individual Time: 1300-1330 SLP Individual Time Calculation (min): 30 min  Short Term Goals: Week 2: SLP Short Term Goal 1 (Week 2): Pt will demonstrate alternating attention in 5-7 minute intervals with supervision A verbal cues, SLP Short Term Goal 2 (Week 2): Pt will demonstrate mildyl complex problem solving skills in familiar tasks with mod A verbal cues. SLP Short Term Goal 3 (Week 2): Pt will demonstrate self-monitoring in basic problem solving tasks with min A verbal cues. SLP Short Term Goal 4 (Week 2): Pt will demonstrate recall of novel, functional information with mod A verbal cues for compensatory aids.  Skilled Therapeutic Interventions: Skilled ST intervention performed with focus on cognitive goals. Patient sustained attention during functional task with min A verbal cues. SLP facilitated medication management task with with Mod A verbal and visual cues. Patient identified organization errors within weekly am/pillbox with 50% accuracy progressing to 80% accuracy with Mod verbal cues. Generated appropriate solutions with 75% accuracy and Min A verbal cues. Patient appeared sleepy today which may have impacted performance. Patient was left in chair with alarm activated and needs within reach. Continue per ST POC.    Pain Pain Assessment Pain Scale: 0-10 Pain Score: 0-No pain  Therapy/Group: Individual Therapy  Patty Sermons 09/26/2020, 1:26 PM

## 2020-09-26 NOTE — Progress Notes (Signed)
Occupational Therapy Session Note  Patient Details  Name: Gabriel [name] MRN: [id number] Date of Birth: 18-Aug-1930  Today's Date: 09/26/2020 OT Individual Time: 1000-1058 OT Individual Time Calculation (min): 58 min    Short Term Goals: Week 1:  OT Short Term Goal 1 (Week 1): Pt will complete bathing at shower level with CGA OT Short Term Goal 1 - Progress (Week 1): Met OT Short Term Goal 2 (Week 1): Pt will complete LB dressing (Including footwear) with CGA OT Short Term Goal 2 - Progress (Week 1): Partly met OT Short Term Goal 3 (Week 1): Pt will complete UB dressing with min assist OT Short Term Goal 3 - Progress (Week 1): Met OT Short Term Goal 4 (Week 1): Pt will complete 2 grooming tasks in standing at sink to demonstrate increased standing balance and tolerance OT Short Term Goal 4 - Progress (Week 1): Met Week 2:  OT Short Term Goal 1 (Week 2): STG = LTG due to LOS Week 3:  OT Short Term Goal 1 (Week 3): STGs=LTGs due to extended LOS   Skilled Therapeutic Interventions/Progress Updates:    Pt greeted at time of session sitting up in recliner agreeable to OT session, no pain reported. Declined all ADL, did not need to toilet/bathe/dress. Walked short distance recliner > wheelchair Min A. Focus at beginning of session on body mechanics for scooting to edge, feet placement, and pushing up to stand prior to ambulating. Wheelchair transport > gym and walked short distance to mat same manner with RW. Focused on posture and body alignment, mirror for feedback and Max cues throughout session. 2x10 scap retractions, 1x10 shoulder raises, and attempted shoulder rolls with max cues and demonstration but unable to sequence. Partial squats 1x10 and second set too tired and switched activity. Standing with BUE support, pt tapping various colored dots on floor alternating LE with Min A for standing balance, able to stand each round 1-2 minutes. Transported back to room, up in wheelchair alarm on  call bell in reach for nexxt session.  Therapy Documentation Precautions:  Precautions Precautions: Fall Precaution Comments: crani Restrictions Weight Bearing Restrictions: No     Therapy/Group: Individual Therapy  Viona Gilmore 09/26/2020, 7:17 AM

## 2020-09-27 MED ORDER — HYDROCODONE-ACETAMINOPHEN 5-325 MG PO TABS: 1.0000 | ORAL_TABLET | Freq: Three times a day (TID) | ORAL | Status: DC | PRN

## 2020-09-27 NOTE — Progress Notes (Signed)
Occupational Therapy Session Note  Patient Details  Name: Gabriel [name] MRN: [id number] Date of Birth: [birth year]  Today's Date: 09/27/2020 OT Individual Time: 6286-3817 OT Individual Time Calculation (min): 27 min   Short Term Goals: Week 1:  OT Short Term Goal 1 (Week 1): Pt will complete bathing at shower level with CGA OT Short Term Goal 1 - Progress (Week 1): Met OT Short Term Goal 2 (Week 1): Pt will complete LB dressing (Including footwear) with CGA OT Short Term Goal 2 - Progress (Week 1): Partly met OT Short Term Goal 3 (Week 1): Pt will complete UB dressing with min assist OT Short Term Goal 3 - Progress (Week 1): Met OT Short Term Goal 4 (Week 1): Pt will complete 2 grooming tasks in standing at sink to demonstrate increased standing balance and tolerance OT Short Term Goal 4 - Progress (Week 1): Met  Skilled Therapeutic Interventions/Progress Updates:    Pt greeted while sitting on the toilet, RN supervising. Pt had just had a successful BM, able to complete some perihygiene while seated but ultimately needed assistance in standing for thoroughness. CGA for sit<stand using RW with heavy reliance on grab bar for power up. After OT assisted with hygiene, pt able to elevate pull ups + pants with CGA. CGA for ambulation to the sink where he washed his hands with supervision/cues. He wanted to transfer to bed vs sit up in the recliner. While EOB, educated pt on UB stretches to relieve muscular tension, emphasis placed on addressing deficits with posture, pt with forward head carriage, kyphosis, and Rt lateral lean. Transitioned to core exercises via activity involving anterior reaches to target while crossing midline. Note that pt had a few posterior LOBs and needed assistance to correct balance. He could not flex shoulders above 90 degrees bilaterally so activity was modified to increase his therapeutic success. Max A to return to supine position, noted great difficultly with motor  planning this, even when given step by step cues. He remained in bed at close of session, all needs within reach and bed alarm set.  Therapy Documentation Precautions:  Precautions Precautions: Fall Precaution Comments: crani Restrictions Weight Bearing Restrictions: No  Vital Signs: Therapy Vitals Temp: 98 F (36.7 C) Pulse Rate: 94 Resp: 17 BP: 103/77 Patient Position (if appropriate): Sitting Oxygen Therapy SpO2: 98 % O2 Device: Room Air Pain: pt denied having pain   ADL: ADL Eating: Not assessed Where Assessed-Eating: Edge of bed Grooming: Minimal assistance (OTS washed pt hair and combed hair, pt able to apply deodorant) Where Assessed-Grooming: Chair Upper Body Bathing: Supervision/safety Where Assessed-Upper Body Bathing: Chair Lower Body Bathing: Supervision/safety Where Assessed-Lower Body Bathing: Chair Upper Body Dressing: Supervision/safety Where Assessed-Upper Body Dressing: Chair Lower Body Dressing: Maximal assistance Where Assessed-Lower Body Dressing: Chair Toileting: Not assessed Where Assessed-Toileting: Glass blower/designer: Not assessed Armed forces technical officer Method: Counselling psychologist: Radiographer, therapeutic: Not assessed Social research officer, government: Not assessed Social research officer, government Method: Radiographer, therapeutic: Shower seat with back, Grab bars      Therapy/Group: Individual Therapy  Carren Blakley A Kaveon Blatz 09/27/2020, 4:08 PM

## 2020-09-27 NOTE — Progress Notes (Signed)
Inpatient Rehabilitation Care Coordinator Discharge Note  The overall goal for the admission was met for:   Discharge location: Yes-HOME WITH WIFE AND HIRED ASSIST AWARE 24/7 CARE NEEDED  Length of Stay: Yes-22 days  Discharge activity level: Yes-SUPERVISION-MIN ASSIST  Home/community participation: Yes  Services provided included: MD, RD, PT, OT, SLP, RN, CM, TR, Pharmacy, and SW  Financial Services: Private Insurance: [employer]  Choices offered to/list presented to:PT AND WIFE  Follow-up services arranged: Home Health: [facility]-PT,OT, SP, DME: [facility]-WHEELCHAIR, [place], Other: WIFE HIRING PRIVATE DUTY TO ASSIST WITH HIS CARE, and Patient/Family has no preference for HH/DME agencies WILL HAVE OP PALLIATIVE CARE ALSO. [facility] AND FAMILY PLANS TO GET A LIFT CHAIR  Comments (or additional information):WIFE AND SON WERE HERE FOR EDUCATION-DAUGHTER ALSO INVOLVED.  Patient/Family verbalized understanding of follow-up arrangements: Yes  Individual responsible for coordination of the follow-up plan: SUZANNE-WIFE [phone]  Confirmed correct DME delivered: Elease Hashimoto 09/27/2020    Mikaylah Libbey, Gardiner Rhyme

## 2020-09-27 NOTE — Progress Notes (Signed)
Speech Language Pathology Daily Session Note  Patient Details  Name: Gabriel [name] MRN: [id number] Date of Birth: 10-17-1930  Today's Date: 09/27/2020 SLP Individual Time: 1100-1200 SLP Individual Time Calculation (min): 60 min  Short Term Goals: Week 3: SLP Short Term Goal 1 (Week 3): Pt will demonstrate mildy complex problem solving skills in familiar tasks with min A verbal cues. SLP Short Term Goal 2 (Week 3): Pt will demonstrate self-monitoring in basic problem solving tasks with Sup A verbal cues SLP Short Term Goal 3 (Week 3): Pt will demonstrate recall of novel, functional information with min A verbal cues for compensatory aids.  Skilled Therapeutic Interventions: Skilled ST intervention performed with focus on cognitive goals. ST facilitated problem solving and abstract reasoning task with Min A verbal cues and 80% accuracy. Patient recalled directions of familiar card game with intermittent cues for clarity (would occasionally trail off in thought), and participated in game with Min A verbal cues for initiation and re-direction to alternate attention. Patient was left in recliner chair with alarm activated and needs within reach. Continue per ST POC.    Pain Pain Assessment Pain Scale: 0-10 Pain Score: 0-No pain  Therapy/Group: Individual Therapy  Patty Sermons 09/27/2020, 4:37 PM

## 2020-09-27 NOTE — Progress Notes (Signed)
PROGRESS NOTE   Subjective/Complaints: Sleepy. Participated well with OT today Denies pain Able to walk to toiled MinA  ROS:  Pt denies SOB, abd pain, CP, N/V/C/D, and vision changes, pain, +diffuse weakness, gas pains have resolved, back pain improved, insomnia has improved   Objective:   No results found. No results for input(s): WBC, HGB, HCT, PLT in the last 72 hours.   No results for input(s): NA, K, CL, CO2, GLUCOSE, BUN, CREATININE, CALCIUM in the last 72 hours.    Intake/Output Summary (Last 24 hours) at 09/27/2020 1211 Last data filed at 09/27/2020 1112 Gross per 24 hour  Intake 598 ml  Output 300 ml  Net 298 ml        Physical Exam: Vital Signs Blood pressure 127/82, pulse 62, temperature 98.3 F (36.8 C), resp. rate 16, weight 83.9 kg, SpO2 97 %. Gen: no distress, normal appearing HEENT: oral mucosa pink and moist, NCAT Cardio: Reg rate Chest: normal effort, normal rate of breathing Abd: soft, non-distended Ext: no edema Psych: pleasant, normal affect Musculoskeletal:     Cervical back: Normal range of motion and neck supple.     Comments: RUE- 5/5 except finger abd 5-/5 LUE- 5/5 except finger abd 4+/5 RLE- HF 4+/5, KE 4/5, DF and PF 4-/5 LLE- HF 4-/5, KE 4-/5, DF 3-/5 and PF 4/5 --mildly improved ankle motion +tenderness to palpation over thoracic paraspinal Skin:    General: Skin is warm and dry. Crani incision CDI with staples  Neurological:     Comments: alert. Follows basic commands. Fair insight and awareness. Can be tangential. Delayed processing L inattention noted on exam Intact to light touch in all 4 extremities-   Assessment/Plan: 1. Functional deficits which require 3+ hours per day of interdisciplinary therapy in a comprehensive inpatient rehab setting. Physiatrist is providing close team supervision and 24 hour management of active medical problems listed below. Physiatrist  and rehab team continue to assess barriers to discharge/monitor patient progress toward functional and medical goals  Care Tool:  Bathing    Body parts bathed by patient: Right arm, Left arm, Chest, Abdomen, Front perineal area, Right upper leg, Left upper leg, Face   Body parts bathed by helper: Buttocks, Left lower leg, Right lower leg Body parts n/a: Right upper leg, Left upper leg, Right lower leg, Left lower leg (Did not complete due to time constraint)   Bathing assist Assist Level: Minimal Assistance - Patient > 75%     Upper Body Dressing/Undressing Upper body dressing   What is the patient wearing?: Button up shirt    Upper body assist Assist Level: Set up assist    Lower Body Dressing/Undressing Lower body dressing      What is the patient wearing?: Underwear/pull up, Pants     Lower body assist Assist for lower body dressing: Moderate Assistance - Patient 50 - 74%     Toileting Toileting    Toileting assist Assist for toileting: Contact Guard/Touching assist     Transfers Chair/bed transfer  Transfers assist     Chair/bed transfer assist level: Contact Guard/Touching assist Chair/bed transfer assistive device: Walker, Clinical biochemist   Ambulation assist  Assist level: Contact Guard/Touching assist Assistive device: Walker-rolling Max distance: 39ft   Walk 10 feet activity   Assist     Assist level: Contact Guard/Touching assist Assistive device: Walker-rolling   Walk 50 feet activity   Assist Walk 50 feet with 2 turns activity did not occur: Safety/medical concerns  Assist level: Contact Guard/Touching assist Assistive device: Walker-rolling    Walk 150 feet activity   Assist Walk 150 feet activity did not occur: Safety/medical concerns  Assist level: Contact Guard/Touching assist Assistive device: Walker-rolling    Walk 10 feet on uneven surface  activity   Assist Walk 10 feet on uneven surfaces  activity did not occur: Safety/medical concerns         Wheelchair     Assist Will patient use wheelchair at discharge?: No Type of Wheelchair: Manual    Wheelchair assist level: Supervision/Verbal cueing Max wheelchair distance: 111ft    Wheelchair 50 feet with 2 turns activity    Assist        Assist Level: Supervision/Verbal cueing   Wheelchair 150 feet activity     Assist      Assist Level: Supervision/Verbal cueing   Blood pressure 127/82, pulse 62, temperature 98.3 F (36.8 C), resp. rate 16, weight 83.9 kg, SpO2 97 %.  Medical Problem List and Plan: 1.  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  Status post stereotactic right temporal craniotomy resection of brain metastasis 09/03/2020. Has small 5mm Right frontal mass as well  Follow-up medical oncology as well as radiation oncology with radiation therapy as directed.  Continue Decadron protocol             -patient may shower once staples out             -ELOS/Goals: 12-16 days supervision  -Continue PT, OT and SLP    -After discussion with palliative care patient and wife have decided on DNR status  -provided dietary education to maximize benefits from outpatient immunotherapy.  2.  Antithrombotics: -DVT/anticoagulation: SCDs.             -antiplatelet therapy: N/A 3. Thoracic spinal pain: Lidocaine patch ordered. Added Norco PRN. Improved. Not requiring Norco. Decrease to q8H PRN 4. Situational anxiety: Discontinue Ativan 0.25- no need for scans.              -antipsychotic agents: N/A 5. Neuropsych: This patient is capable of making decisions on his own behalf. 6. Skin/Wound Care: Routine skin checks. Staples d/ced on 6/14- nursing order placed. Site is c/d/i 7. Fluids/Electrolytes/Nutrition: Routine in and outs with follow-up chemistries 8.  Seizure prophylaxis.  d/c Keppra, completed course 9.  Hypertension.  d/c anti-hypertensives. Resolved. Now well controlled off medications: discussed  with wife use of TEDs at home if hypotensive, but appears to be stable.  10.  Hyperlipidemia.  Continue Lipitor Vitals:   09/26/20 1924 09/27/20 0416  BP: 106/84 127/82  Pulse: 88 62  Resp: 16 16  Temp: 98.5 F (36.9 C) 98.3 F (36.8 C)  SpO2: 97% 97%   11.  Constipation.  Continue Senokot twice daily, Colace twice daily, Dulcolax suppository daily as needed. Received sorbitol once.  12. Hemorrhoids: bleeding, painless, discussed hgb stable, repeat Monday 13. Increased weakness/slowed response: Head CT normal. Labs stable. UA/UC normal. Cr better. Magnesium normalized 14. AKI: now resolved, could have been contributing to worsening function: D/c HCTZ and lisinopril. Repeat Monday 15. Hypomagnesium: supplemented with 2g IV 6/18  Has also helped with BP after stopping two anti-hypertensives.  16. Severe  vitamin D deficiency: Continue Ergocalciferol 50,000U weekly x7 weeks 17. Insomnia: Continue Trazodone 100mg  HS PRN  18. Gas: resolved with cessation of Ensure.  19. CODE STATUS: DNR: palliative care involved, recommend cont palliative care as OP 20. Disposition: HFU scheduled with me on 11/22/20 at 2pm. D/c extended 1 week. Updated wife and son.  LOS: 17 days A FACE TO FACE EVALUATION WAS PERFORMED  Gabriel [name] 09/27/2020, 12:11 PM

## 2020-09-27 NOTE — Progress Notes (Signed)
Occupational Therapy Session Note  Patient Details  Name: Gabriel [name] MRN: [id number] Date of Birth: 01-13-1931  Today's Date: 09/27/2020 OT Individual Time: 1002-1058 OT Individual Time Calculation (min): 56 min    Short Term Goals: Week 3:  OT Short Term Goal 1 (Week 3): STGs=LTGs due to extended LOS  Skilled Therapeutic Interventions/Progress Updates:    Pt received in bed with bed and pants soaked in urine. Pt req mod A to transition to sit EOB, min A to stand and walk to toilet with RW with min A. Pt completed toilet task and transfer with CGA and instructed to take off clothes to wash up and get changed. Pt is very slow moving and requires increased time for all tasks. Pt completed UB bathing and dressing with setup and increased time, LB bathing and dressing with mod A and increased time all while sitting on commode. Pt able to stand and hike pants with CGA, walk out to sit in recliner with CGA. Pt "plops" down in recliner, educated him on the need to reach back and slowly lower self down to sitting for safety. Pt then instructed in BUE strengthening HEP with orange theraband to increase strength and activity tolerance for ADLs and functional transfers. Pt instructed in tricep extension, shoulder internal/external rotation, elbow flexion and chest press for 3x15 with min cuing for proper technique with good carryover. After tx, pt left up in recliner with chair alarm on and all needs met.   Therapy Documentation Precautions:  Precautions Precautions: Fall Precaution Comments: crani Restrictions Weight Bearing Restrictions: No    Pain: Pain Assessment Pain Scale: 0-10 Pain Score: 0-No pain    Therapy/Group: Individual Therapy  Melda Mermelstein 09/27/2020, 10:16 AM

## 2020-09-27 NOTE — Progress Notes (Signed)
Palliative Medicine Inpatient Follow Up Note  Reason for consult:  Goals of Care   HPI:  Per intake H&P -->  HPI: Gabriel Gabriel [name] is a 87 year old right-handed male with history of hyperlipidemia, hypertension, CVA with unsteady gait maintained on Plavix.  Per chart review patient lives with spouse.  Independent with assistive device.  1 Gabriel [name] home 2 steps to entry.  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  CT scan revealed left upper lobe mass 7.8 x 6.8 cm in size.  A small left hilar node was also identified.  Additionally there was renal masses concerning for primary renal disease versus metastatic disease.  MRI of the brain demonstrated 3 intracranial metastases including 2 small subcentimeter metastases and a larger right hemorrhagic lesion.  Patient was established with medical oncology Dr. Mohammed/radiation oncology Dr. Lisbeth Renshaw.  PET scan completed showing hypermetabolic lingular mass with central necrosis consistent with primary bronchogenic carcinoma.  No evidence of mediastinal nodal metastasis.  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  Patient was admitted 09/03/2020 and underwent stereotactic right temporal craniotomy for resection of brain metastasis per Dr. Kathyrn Sheriff.  Surgical pathology report consistent with metastatic melanoma.  Maintained on Keppra for seizure prophylaxis.  Decadron protocol as indicated.  Medical oncology as well as radiation oncology continue to follow with radiation therapy as directed.  Tolerating a regular consistency diet.  No current plan at this time for DVT prophylaxis due to some bleeding hemorrhoids.  Therapy evaluations completed due to patient decreased functional mobility patient was admitted for a comprehensive rehab program where he has been for the past ten days. Plan for discharge midweek. Palliative care has been requested to  further discuss goals of care in the setting of metastatic disease.   Today's Discussion (09/27/2020):  *Please note that this is a verbal dictation therefore any spelling or grammatical errors are due to the "[employer] [place] [employer]" system interpretation.  Chart reviewed.   I met with Gabriel Gabriel [name] and his wife, Gabriel Gabriel [name] this afternoon. They share that this week has been "better". Gabriel Gabriel [name] states that through the help of her son and daughter their home is in a better place for [name] to come home to. Reviewed the importance of home health on [name]'s strength upon discharge.  A copy of [name]'s advance directives has been obtained and will be scanned into [employer].  Questions and Concerns Answered  Objective Assessment: Vital Signs Vitals:   09/27/20 0416 09/27/20 1309  BP: 127/82 103/77  Pulse: 62 94  Resp: 16 17  Temp: 98.3 F (36.8 C) 98 F (36.7 C)  SpO2: 97% 98%    Intake/Output Summary (Last 24 hours) at 09/27/2020 1425 Last data filed at 09/27/2020 1237 Gross per 24 hour  Intake 478 ml  Output 300 ml  Net 178 ml    Last Weight  Most recent update: 09/10/2020 11:12 PM    Weight  83.9 kg (184 lb 15.5 oz)            Gen:  Elderly M in NAD HEENT: moist mucous membranes CV: Regular rate and rhythm, no murmurs rubs or gallops PULM: clear to auscultation bilaterally ABD: soft/nontender/ EXT: No edema Neuro: Alert and oriented x3  SUMMARY OF RECOMMENDATIONS   DNAR/DNI   MOST / DNR Form Completed, paper copy placed onto the chart electric copy can be found in [employer] obtained - will be scanned into [employer]  TOC OP Palliative Care   Ongoing support incrementally  Time Spent: 25 Greater than 50% of the time was spent in counseling and coordination of care ______________________________________________________________________________________ [facility] Team Team Cell Phone: [phone] Please utilize secure chat with  additional questions, if there is no response within 30 minutes please call the above phone number  Palliative Medicine Team providers are available by phone from 7am to 7pm daily and can be reached through the team cell phone.  Should this patient require assistance outside of these hours, please call the patient's attending physician.

## 2020-09-27 NOTE — Progress Notes (Signed)
Physical Therapy Session Note  Patient Details  Name: Gabriel [name] MRN: [id number] Date of Birth: 1930-04-21  Today's Date: 09/27/2020 PT Individual Time: 1415-1500 PT Individual Time Calculation (min): 45 min   Short Term Goals: Week 2:  PT Short Term Goal 1 (Week 2): STG=LTG due to LOS  Skilled Therapeutic Interventions/Progress Updates: Pt presents sitting in recliner asleep and listing to right.  Pt easily awakens and is agreeable to therapy.  Pt required mod A for sit to stand transfer from recliner 2/2 fatigue, but then only requires CGA from w/c and bed and stand to sit on toilet.  Pt amb x 10' to w/c w/ verbal cues for flexed posture and for increased foot clearance.  Pt wheeled to dayroom for time conservation.  Pt performed multiple standing trials for throwing beanbags for cornhole.  Pt performed reaching across midline w/ R hand, L hand and then standing using BUEs simultaneously.  Pt had 1 episode of LOB requiring min A to regain as well as verbal cues.  Pt amb x 20' w/ RW and CGA, verbal cues for safetyt.  Pt returned to room and amb x 12' from doorway to bed.  Pt assisted w/ doffing shoes and socks w/ retropulsion noted.  Pt then states need to use BR.  Pt transferred sit to stand w/ CGA and amb x 7' w/ RW and CGA into BR.  Pt required increased cueing for sequencing turning and then able to manage clothing w/ CGA for balance.  Pt then transferred stand to commode w/ CGA.  Pt will pull cord when finished.  Nsg aware and will remain in room w/ pt.     Therapy Documentation Precautions:  Precautions Precautions: Fall Precaution Comments: crani Restrictions Weight Bearing Restrictions: No General:   Vital Signs: Therapy Vitals Temp: 98 F (36.7 C) Pulse Rate: 94 Resp: 17 BP: 103/77 Patient Position (if appropriate): Sitting Oxygen Therapy SpO2: 98 % O2 Device: Room Air Pain:0/10   Mobility:   Locomotion :    Trunk/Postural Assessment :    Balance:    Exercises:   Other Treatments:      Therapy/Group: Individual Therapy  [name] 09/27/2020, 3:00 PM

## 2020-09-28 MED ORDER — HYDROCODONE-ACETAMINOPHEN 5-325 MG PO TABS: 1.0000 | ORAL_TABLET | Freq: Two times a day (BID) | ORAL | Status: DC | PRN

## 2020-09-28 NOTE — Progress Notes (Signed)
PROGRESS NOTE   Subjective/Complaints: Alert and eating ice pop Did well with therapy this morning- MinA  ROS:  Pt denies SOB, abd pain, CP, N/V/C/D, and vision changes, pain, +diffuse weakness, gas pains have resolved, back pain improved, insomnia has improved   Objective:   No results found. No results for input(s): WBC, HGB, HCT, PLT in the last 72 hours.   No results for input(s): NA, K, CL, CO2, GLUCOSE, BUN, CREATININE, CALCIUM in the last 72 hours.    Intake/Output Summary (Last 24 hours) at 09/28/2020 1211 Last data filed at 09/28/2020 0745 Gross per 24 hour  Intake 417 ml  Output 450 ml  Net -33 ml        Physical Exam: Vital Signs Blood pressure 118/63, pulse 75, temperature 98.8 F (37.1 C), resp. rate 14, weight 83.9 kg, SpO2 96 %. Gen: no distress, normal appearing HEENT: oral mucosa pink and moist, NCAT Cardio: Reg rate Chest: normal effort, normal rate of breathing Abd: soft, non-distended Ext: no edema Psych: pleasant, normal affect Skin: intact Neuro Musculoskeletal:     Cervical back: Normal range of motion and neck supple.     Comments: RUE- 5/5 except finger abd 5-/5 LUE- 5/5 except finger abd 4+/5 RLE- HF 4+/5, KE 4/5, DF and PF 4-/5 LLE- HF 4-/5, KE 4-/5, DF 3-/5 and PF 4/5 --mildly improved ankle motion +tenderness to palpation over thoracic paraspinal Skin:    General: Skin is warm and dry. Crani incision CDI with staples  Neurological:     Comments: alert. Follows basic commands. Fair insight and awareness. Can be tangential. Delayed processing L inattention noted on exam Intact to light touch in all 4 extremities-   Assessment/Plan: 1. Functional deficits which require 3+ hours per day of interdisciplinary therapy in a comprehensive inpatient rehab setting. Physiatrist is providing close team supervision and 24 hour management of active medical problems listed  below. Physiatrist and rehab team continue to assess barriers to discharge/monitor patient progress toward functional and medical goals  Care Tool:  Bathing    Body parts bathed by patient: Right arm, Left arm, Chest, Abdomen, Front perineal area, Right upper leg, Left upper leg, Face   Body parts bathed by helper: Buttocks, Left lower leg, Right lower leg Body parts n/a: Right upper leg, Left upper leg, Right lower leg, Left lower leg (Did not complete due to time constraint)   Bathing assist Assist Level: Minimal Assistance - Patient > 75%     Upper Body Dressing/Undressing Upper body dressing   What is the patient wearing?: Button up shirt    Upper body assist Assist Level: Set up assist    Lower Body Dressing/Undressing Lower body dressing      What is the patient wearing?: Underwear/pull up, Pants     Lower body assist Assist for lower body dressing: Moderate Assistance - Patient 50 - 74%     Toileting Toileting    Toileting assist Assist for toileting: Contact Guard/Touching assist     Transfers Chair/bed transfer  Transfers assist     Chair/bed transfer assist level: Contact Guard/Touching assist Chair/bed transfer assistive device: Walker, Clinical biochemist   Ambulation assist  Assist level: Contact Guard/Touching assist Assistive device: Walker-rolling Max distance: 20   Walk 10 feet activity   Assist     Assist level: Contact Guard/Touching assist Assistive device: Walker-rolling   Walk 50 feet activity   Assist Walk 50 feet with 2 turns activity did not occur: Safety/medical concerns  Assist level: Contact Guard/Touching assist Assistive device: Walker-rolling    Walk 150 feet activity   Assist Walk 150 feet activity did not occur: Safety/medical concerns  Assist level: Contact Guard/Touching assist Assistive device: Walker-rolling    Walk 10 feet on uneven surface  activity   Assist Walk 10 feet on  uneven surfaces activity did not occur: Safety/medical concerns         Wheelchair     Assist Will patient use wheelchair at discharge?: No Type of Wheelchair: Manual    Wheelchair assist level: Supervision/Verbal cueing Max wheelchair distance: 150ft    Wheelchair 50 feet with 2 turns activity    Assist        Assist Level: Supervision/Verbal cueing   Wheelchair 150 feet activity     Assist      Assist Level: Supervision/Verbal cueing   Blood pressure 118/63, pulse 75, temperature 98.8 F (37.1 C), resp. rate 14, weight 83.9 kg, SpO2 96 %.  Medical Problem List and Plan: 1.  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  Status post stereotactic right temporal craniotomy resection of brain metastasis 09/03/2020. Has small 84mm Right frontal mass as well  Follow-up medical oncology as well as radiation oncology with radiation therapy as directed.  Continue Decadron protocol             -patient may shower once staples out             -ELOS/Goals: 12-16 days supervision  -Continue PT, OT and SLP    -After discussion with palliative care patient and wife have decided on DNR status  -provided dietary education to maximize benefits from outpatient immunotherapy.  2.  Antithrombotics: -DVT/anticoagulation: SCDs.             -antiplatelet therapy: N/A 3. Thoracic spinal pain: Lidocaine patch ordered. Added Norco PRN. Improved. Not requiring Norco. Decrease Norco to q12H PRN 4. Situational anxiety: Discontinue Ativan 0.25- no need for scans.              -antipsychotic agents: N/A 5. Neuropsych: This patient is capable of making decisions on his own behalf. 6. Skin/Wound Care: Routine skin checks. Staples d/ced on 6/14- nursing order placed. Site is c/d/i 7. Fluids/Electrolytes/Nutrition: Routine in and outs with follow-up chemistries 8.  Seizure prophylaxis.  d/c Keppra, completed course 9.  Hypertension.  d/c anti-hypertensives. Resolved. Now well controlled off  medications: discussed with wife use of TEDs at home if hypotensive, but appears to be stable.  10.  Hyperlipidemia.  Continue Lipitor Vitals:   09/27/20 1949 09/28/20 0506  BP: 104/76 118/63  Pulse: 82 75  Resp: 16 14  Temp: 98.7 F (37.1 C) 98.8 F (37.1 C)  SpO2: 99% 96%   11.  Constipation.  Continue Senokot twice daily, Colace twice daily, Dulcolax suppository daily as needed. Received sorbitol once.  12. Hemorrhoids: bleeding, painless, discussed hgb stable, repeat Monday 13. Increased weakness/slowed response: Head CT normal. Labs stable. UA/UC normal. Cr better. Magnesium normalized 14. AKI: now resolved, could have been contributing to worsening function: D/c HCTZ and lisinopril. Repeat Monday 15. Hypomagnesium: supplemented with 2g IV 6/18  Has also helped with BP after stopping two anti-hypertensives.  16.  Severe vitamin D deficiency: Continue Ergocalciferol 50,000U weekly x7 weeks 17. Insomnia: Continue Trazodone 100mg  HS PRN  18. Gas: resolved with cessation of Ensure.  19. CODE STATUS: DNR: palliative care involved, recommend cont palliative care as OP 20. Disposition: HFU scheduled with me on 11/22/20 at 2pm. D/c extended 1 week. Updated wife and son.  LOS: 18 days A FACE TO FACE EVALUATION WAS PERFORMED  Gabriel [name] Teletha Petrea 09/28/2020, 12:11 PM

## 2020-09-28 NOTE — Plan of Care (Signed)
  Problem: Consults Goal: RH BRAIN INJURY PATIENT EDUCATION Description: Description: See Patient Education module for eduction specifics Outcome: Progressing   Problem: RH BOWEL ELIMINATION Goal: RH STG MANAGE BOWEL WITH ASSISTANCE Description: STG Manage Bowel with mod I Assistance. Outcome: Progressing Goal: RH STG MANAGE BOWEL W/MEDICATION W/ASSISTANCE Description: STG Manage Bowel with Medication with mod I Assistance. Outcome: Progressing   Problem: RH BLADDER ELIMINATION Goal: RH STG MANAGE BLADDER WITH ASSISTANCE Description: STG Manage Bladder With  mod I Assistance Outcome: Progressing   Problem: RH SAFETY Goal: RH STG ADHERE TO SAFETY PRECAUTIONS W/ASSISTANCE/DEVICE Description: STG Adhere to Safety Precautions With cues/reminders  Assistance/Device. Outcome: Progressing   Problem: RH PAIN MANAGEMENT Goal: RH STG PAIN MANAGED AT OR BELOW PT'S PAIN GOAL Description: At or below level 4  Outcome: Progressing   Problem: RH KNOWLEDGE DEFICIT BRAIN INJURY Goal: RH STG INCREASE KNOWLEDGE OF SELF CARE AFTER BRAIN INJURY Description: Patient will be able to manage care at discharge using handouts and educational tools with cues/reminders Outcome: Progressing

## 2020-09-28 NOTE — Progress Notes (Signed)
Occupational Therapy Session Note  Patient Details  Name: Gabriel [name] MRN: [id number] Date of Birth: 09/03/1930  Today's Date: 09/28/2020 OT Individual Time: 3300-7622 OT Individual Time Calculation (min): 59 min    Short Term Goals: Week 2:  OT Short Term Goal 1 (Week 2): STG = LTG due to LOS Week 3:  OT Short Term Goal 1 (Week 3): STGs=LTGs due to extended LOS  Skilled Therapeutic Interventions/Progress Updates:     Pt received in bed with 0 out of 10 pain .  ADL:  Pt completes footwear with total A for teds and MIN A for using OT finger in back of shoe and max cuing for pushing LLE into shoe.  Pt completes grooming with CGA standing at sink with no cuing needed for sequencing. MIN A for bed mobility and increased time to process cues. MOD A initially to stand for SPT to RW but CGA once up onto feet.   Therapeutic activity Throughotu session if >20 seconds given to initiate pt able to sit>stand with CGA-MIN A for anterior weight shift. Pt requires min facilitation of step through of LLE during short distance functional mobility. Stnading tolerance activity focusing also on L attention and [place] placing colored push pins onto figure on cork board with 2 colors on L for visual scanning and 2 colors on R. Pt initially filling out R only of picture until cued for L half.  Pt left at end of session in recliner with exit alarm on, call light in reach and all needs met   Therapy Documentation Precautions:  Precautions Precautions: Fall Precaution Comments: crani Restrictions Weight Bearing Restrictions: No General:   Vital Signs: Therapy Vitals Temp: 98.8 F (37.1 C) Pulse Rate: 75 Resp: 14 BP: 118/63 Patient Position (if appropriate): Lying Oxygen Therapy SpO2: 96 % O2 Device: Room Air Pain:   ADL: ADL Eating: Not assessed Where Assessed-Eating: Edge of bed Grooming: Minimal assistance (OTS washed pt hair and combed hair, pt able to apply deodorant) Where  Assessed-Grooming: Chair Upper Body Bathing: Supervision/safety Where Assessed-Upper Body Bathing: Chair Lower Body Bathing: Supervision/safety Where Assessed-Lower Body Bathing: Chair Upper Body Dressing: Supervision/safety Where Assessed-Upper Body Dressing: Chair Lower Body Dressing: Maximal assistance Where Assessed-Lower Body Dressing: Chair Toileting: Not assessed Where Assessed-Toileting: Glass blower/designer: Not assessed Armed forces technical officer Method: Counselling psychologist: Radiographer, therapeutic: Not assessed Social research officer, government: Not assessed Social research officer, government Method: Radiographer, therapeutic: Civil engineer, contracting with back, Systems analyst    Praxis   Exercises:   Other Treatments:     Therapy/Group: Individual Therapy  Tonny Branch 09/28/2020, 6:55 AM

## 2020-09-28 NOTE — Progress Notes (Signed)
   Palliative Medicine Inpatient Follow Up Note  Palliative care has reviewed the chart. At this time goals are clear for rehabilitation and improvement.   Patient and his wife remains hopeful for immunotherapy as an outpatient.   [facility] will follow Gabriel [name] upon discharge for ongoing Palliative Support.  All documents MOST/DNR/Living Will have been scanned into [facility].   We will sign off at this time but please do not hesitate to re-consult if additional support is needed.   No Charge ______________________________________________________________________________________ [facility] Team Team Cell Phone: [phone] Please utilize secure chat with additional questions, if there is no response within 30 minutes please call the above phone number  Palliative Medicine Team providers are available by phone from 7am to 7pm daily and can be reached through the team cell phone.  Should this patient require assistance outside of these hours, please call the patient's attending physician.

## 2020-09-29 MED ORDER — HYDROCODONE-ACETAMINOPHEN 5-325 MG PO TABS
1.0000 | ORAL_TABLET | Freq: Every day | ORAL | Status: DC | PRN
  Administered 2020-10-01 – 2020-10-02 (×2): 1 via ORAL
  Filled 2020-09-29 (×2): qty 1

## 2020-09-29 NOTE — Progress Notes (Signed)
PROGRESS NOTE   Subjective/Complaints: No complaints this morning Watching news, eating nuts, book at bedside Very pleasant Discussed his vitals have been stable  ROS:  Pt denies SOB, abd pain, CP, N/V/C/D, and vision changes, pain, +diffuse weakness, gas pains have resolved, back pain improved, insomnia has improved   Objective:   No results found. No results for input(s): WBC, HGB, HCT, PLT in the last 72 hours.   No results for input(s): NA, K, CL, CO2, GLUCOSE, BUN, CREATININE, CALCIUM in the last 72 hours.    Intake/Output Summary (Last 24 hours) at 09/29/2020 1129 Last data filed at 09/29/2020 0748 Gross per 24 hour  Intake 540 ml  Output 425 ml  Net 115 ml        Physical Exam: Vital Signs Blood pressure 136/77, pulse 63, temperature 97.6 F (36.4 C), temperature source Oral, resp. rate 16, weight 83.9 kg, SpO2 99 %. Gen: no distress, normal appearing HEENT: oral mucosa pink and moist, NCAT Cardio: Reg rate Chest: normal effort, normal rate of breathing Abd: soft, non-distended Ext: no edema Psych: pleasant, normal affect Neuro Musculoskeletal:     Cervical back: Normal range of motion and neck supple.     Comments: RUE- 5/5 except finger abd 5-/5 LUE- 5/5 except finger abd 4+/5 RLE- HF 4+/5, KE 4/5, DF and PF 4-/5 LLE- HF 4-/5, KE 4-/5, DF 3-/5 and PF 4/5 --mildly improved ankle motion +tenderness to palpation over thoracic paraspinal Skin:    General: Skin is warm and dry. Crani incision CDI with staples  Neurological:     Comments: alert. Follows basic commands. Fair insight and awareness. Can be tangential. Delayed processing L inattention noted on exam Intact to light touch in all 4 extremities-   Assessment/Plan: 1. Functional deficits which require 3+ hours per day of interdisciplinary therapy in a comprehensive inpatient rehab setting. Physiatrist is providing close team supervision and  24 hour management of active medical problems listed below. Physiatrist and rehab team continue to assess barriers to discharge/monitor patient progress toward functional and medical goals  Care Tool:  Bathing    Body parts bathed by patient: Right arm, Left arm, Chest, Abdomen, Front perineal area, Right upper leg, Left upper leg, Face   Body parts bathed by helper: Buttocks, Left lower leg, Right lower leg Body parts n/a: Right upper leg, Left upper leg, Right lower leg, Left lower leg (Did not complete due to time constraint)   Bathing assist Assist Level: Minimal Assistance - Patient > 75%     Upper Body Dressing/Undressing Upper body dressing   What is the patient wearing?: Button up shirt    Upper body assist Assist Level: Set up assist    Lower Body Dressing/Undressing Lower body dressing      What is the patient wearing?: Underwear/pull up, Pants     Lower body assist Assist for lower body dressing: Moderate Assistance - Patient 50 - 74%     Toileting Toileting    Toileting assist Assist for toileting: Contact Guard/Touching assist     Transfers Chair/bed transfer  Transfers assist     Chair/bed transfer assist level: Contact Guard/Touching assist Chair/bed transfer assistive device: Walker, Armrests  Locomotion Ambulation   Ambulation assist      Assist level: Contact Guard/Touching assist Assistive device: Walker-rolling Max distance: 20   Walk 10 feet activity   Assist     Assist level: Contact Guard/Touching assist Assistive device: Walker-rolling   Walk 50 feet activity   Assist Walk 50 feet with 2 turns activity did not occur: Safety/medical concerns  Assist level: Contact Guard/Touching assist Assistive device: Walker-rolling    Walk 150 feet activity   Assist Walk 150 feet activity did not occur: Safety/medical concerns  Assist level: Contact Guard/Touching assist Assistive device: Walker-rolling    Walk 10 feet on  uneven surface  activity   Assist Walk 10 feet on uneven surfaces activity did not occur: Safety/medical concerns         Wheelchair     Assist Will patient use wheelchair at discharge?: No Type of Wheelchair: Manual    Wheelchair assist level: Supervision/Verbal cueing Max wheelchair distance: 142ft    Wheelchair 50 feet with 2 turns activity    Assist        Assist Level: Supervision/Verbal cueing   Wheelchair 150 feet activity     Assist      Assist Level: Supervision/Verbal cueing   Blood pressure 136/77, pulse 63, temperature 97.6 F (36.4 C), temperature source Oral, resp. rate 16, weight 83.9 kg, SpO2 99 %.  Medical Problem List and Plan: 1.  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  Status post stereotactic right temporal craniotomy resection of brain metastasis 09/03/2020. Has small 91mm Right frontal mass as well  Follow-up medical oncology as well as radiation oncology with radiation therapy as directed.  Continue Decadron protocol             -patient may shower once staples out             -ELOS/Goals: 12-16 days supervision  -Continue PT, OT and SLP    -After discussion with palliative care patient and wife have decided on DNR status  -provided dietary education to maximize benefits from outpatient immunotherapy.  2.  Antithrombotics: -DVT/anticoagulation: SCDs.             -antiplatelet therapy: N/A 3. Thoracic spinal pain: Lidocaine patch ordered. Added Norco PRN. Improved. Not requiring Norco. Decrease Norco to daily PRN. 4. Situational anxiety: Discontinue Ativan 0.25- no need for scans.              -antipsychotic agents: N/A 5. Neuropsych: This patient is capable of making decisions on his own behalf. 6. Skin/Wound Care: Routine skin checks. Staples d/ced on 6/14- nursing order placed. Site is c/d/i 7. Fluids/Electrolytes/Nutrition: Routine in and outs with follow-up chemistries 8.  Seizure prophylaxis.  d/c Keppra, completed  course 9.  Hypertension.  d/c anti-hypertensives. Resolved. Now well controlled off medications: discussed with wife use of TEDs at home if hypotensive, but appears to be stable.  10.  Hyperlipidemia.  Continue Lipitor Vitals:   09/28/20 1948 09/29/20 0545  BP: 114/71 136/77  Pulse: 78 63  Resp: 18 16  Temp: 98 F (36.7 C) 97.6 F (36.4 C)  SpO2: 100% 99%   11.  Constipation.  Continue Senokot twice daily, Colace twice daily, Dulcolax suppository daily as needed. Received sorbitol once.  12. Hemorrhoids: bleeding, painless, discussed hgb stable, repeat Monday 13. Increased weakness/slowed response: Head CT normal. Labs stable. UA/UC normal. Cr better. Magnesium normalized 14. AKI: now resolved, could have been contributing to worsening function: D/c HCTZ and lisinopril. Repeat Monday 15. Hypomagnesium: supplemented with 2g  IV 6/18  Has also helped with BP after stopping two anti-hypertensives.  16. Severe vitamin D deficiency: Continue Ergocalciferol 50,000U weekly x7 weeks 17. Insomnia: Continue Trazodone 100mg  HS PRN  18. Gas: resolved with cessation of Ensure.  19. CODE STATUS: DNR: palliative care involved, recommend cont palliative care as OP 20. Disposition: HFU scheduled with me on 11/22/20 at 2pm. D/c extended 1 week. Updated wife and son.  LOS: 19 days A FACE TO FACE EVALUATION WAS PERFORMED  Martha Clan P Rigoberto Repass 09/29/2020, 11:29 AM

## 2020-09-29 NOTE — Progress Notes (Signed)
Speech Language Pathology Daily Session Note  Patient Details  Name: Gabriel [name] MRN: [id number] Date of Birth: 1930/07/19  Today's Date: 09/29/2020 SLP Individual Time: 8022-3361 SLP Individual Time Calculation (min): 40 min  Short Term Goals: Week 3: SLP Short Term Goal 1 (Week 3): Pt will demonstrate mildy complex problem solving skills in familiar tasks with min A verbal cues. SLP Short Term Goal 2 (Week 3): Pt will demonstrate self-monitoring in basic problem solving tasks with Sup A verbal cues SLP Short Term Goal 3 (Week 3): Pt will demonstrate recall of novel, functional information with min A verbal cues for compensatory aids.  Skilled Therapeutic Interventions:  Pt was seen for skilled ST targeting cognitive goals.  Pt was asleep upon arrival but awakened to voice and light touch.  During conversations regarding upcoming discharge this week, pt was able to recall at least 3 general safety precautions to minimize fall risk in the home environment with min question cues.  He denies any overt cognitive changes but subjectively presents with increased response latency and susceptibility to environmental distractions in comparison to previous therapy session with this therapist, as evidenced by min cues needed and/or repetition of questions to maintain pt's engagement in conversations.  Pt did not appear overly fatigued but he did report poor sleep last night in addition to being woken up at 545 for medications this morning so question whether lethargy is impacting today's overall presentation.  Pt states that he is feeling good about going home but that his wife is a little "apprehensive" about his transition home.  Pt was left in bed with bed alarm set and call bell within reach.  Continue per current plan of care.    Pain Pain Assessment Pain Scale: 0-10 Pain Score: 0-No pain  Therapy/Group: Individual Therapy  Bharath Bernstein, Selinda Orion 09/29/2020, 10:18 AM

## 2020-09-29 NOTE — Plan of Care (Signed)
  Problem: Consults Goal: RH BRAIN INJURY PATIENT EDUCATION Description: Description: See Patient Education module for eduction specifics Outcome: Progressing   Problem: RH BOWEL ELIMINATION Goal: RH STG MANAGE BOWEL WITH ASSISTANCE Description: STG Manage Bowel with mod I Assistance. Outcome: Progressing Goal: RH STG MANAGE BOWEL W/MEDICATION W/ASSISTANCE Description: STG Manage Bowel with Medication with mod I Assistance. Outcome: Progressing   Problem: RH BLADDER ELIMINATION Goal: RH STG MANAGE BLADDER WITH ASSISTANCE Description: STG Manage Bladder With  mod I Assistance Outcome: Progressing   Problem: RH SAFETY Goal: RH STG ADHERE TO SAFETY PRECAUTIONS W/ASSISTANCE/DEVICE Description: STG Adhere to Safety Precautions With cues/reminders  Assistance/Device. Outcome: Progressing   Problem: RH PAIN MANAGEMENT Goal: RH STG PAIN MANAGED AT OR BELOW PT'S PAIN GOAL Description: At or below level 4  Outcome: Progressing   Problem: RH KNOWLEDGE DEFICIT BRAIN INJURY Goal: RH STG INCREASE KNOWLEDGE OF SELF CARE AFTER BRAIN INJURY Description: Patient will be able to manage care at discharge using handouts and educational tools with cues/reminders Outcome: Progressing

## 2020-09-30 LAB — CBC
HCT: 26 % — ABNORMAL LOW (ref 39.0–52.0)
Hemoglobin: 8.1 g/dL — ABNORMAL LOW (ref 13.0–17.0)
MCH: 27.2 pg (ref 26.0–34.0)
MCHC: 31.2 g/dL (ref 30.0–36.0)
MCV: 87.2 fL (ref 80.0–100.0)
Platelets: 118 10*3/uL — ABNORMAL LOW (ref 150–400)
RBC: 2.98 MIL/uL — ABNORMAL LOW (ref 4.22–5.81)
RDW: 18.4 % — ABNORMAL HIGH (ref 11.5–15.5)
WBC: 7.4 10*3/uL (ref 4.0–10.5)
nRBC: 0.4 % — ABNORMAL HIGH (ref 0.0–0.2)

## 2020-09-30 LAB — BASIC METABOLIC PANEL
Anion gap: 10 (ref 5–15)
BUN: 25 mg/dL — ABNORMAL HIGH (ref 8–23)
CO2: 25 mmol/L (ref 22–32)
Calcium: 8.3 mg/dL — ABNORMAL LOW (ref 8.9–10.3)
Chloride: 101 mmol/L (ref 98–111)
Creatinine, Ser: 0.96 mg/dL (ref 0.61–1.24)
GFR, Estimated: >60 mL/min (ref >60)
Glucose, Bld: 95 mg/dL (ref 70–99)
Potassium: 4 mmol/L (ref 3.5–5.1)
Sodium: 136 mmol/L (ref 135–145)

## 2020-09-30 LAB — HEMOGLOBIN AND HEMATOCRIT, BLOOD
HCT: 29.8 % — ABNORMAL LOW (ref 39.0–52.0)
Hemoglobin: 9.3 g/dL — ABNORMAL LOW (ref 13.0–17.0)

## 2020-09-30 NOTE — Progress Notes (Addendum)
Patient ID: Gabriel [name], male   DOB: November 28, 1930, 87 y.o.   MRN: [id number]  met with pt, wife and [facility]-coordinator evaluating care for home. She reports they can only provide 25 lbs of lifting and if pt requires more they can not do. Pt is weaker today due to no much therapy over the weekend, so hopefully due to this but also he tends to flucuate from day to day his levels. Coordinator will stay for OT session and see how much assist he is today. Discussed with wife to also participate and see. Coordinator discussed if wife helped caregiver could see if less than 25 lbs. Gave wife information regarding ramps. Had thought she and daughter were already looking into this. Son to be here until Sunday to help with transition home. Pt reports legs are weak. Continue to work on discharge plans.  1:14 PM met with pt, wife and daughter who was here to answer questions and discuss best way home. Decided upon ambulance home and to go ahead and get hospital bed in case needed at home. Pt did better in OT session and [facility] Coordinator saw this. Plan for home on Wed.

## 2020-09-30 NOTE — Progress Notes (Signed)
Physical Therapy Session Note  Patient Details  Name: Gabriel [name] MRN: [id number] Date of Birth: [birth year]  Today's Date: 09/30/2020 PT Individual Time: 0830-0940 PT Individual Time Calculation (min): 70 min   Short Term Goals: Week 2:  PT Short Term Goal 1 (Week 2): STG=LTG due to LOS Week 3:  PT Short Term Goal 1 (Week 3): STG=LTG due to extended LOS  Skilled Therapeutic Interventions/Progress Updates:   Received pt semi-reclined in bed, pt agreeable to PT treatment, and denied any pain during session but reported fatigue. Session with emphasis on functional mobility/transfers, generalized strengthening, dynamic standing balance/coordination, toileting, dressing, and improved activity tolerance. Pt transferred semi-reclined<>sitting EOB with HOB elevated and use of bedrails with min A, increased time, and cues for logroll technique. Donned socks and shoes with total A for time management purposes. Pt able to scoot to EOB with supervision, increased time, and max cues for sequencing as pt demonstrates poor motor planning. Attempted to stand from elevated bed x 7 trials with RW and heavy mod A. Pt able to achieve ~75% upright posture, but returned to sitting prior to getting completley upright stating "my legs are just too weak". Attempted lateral scoot into WC due to fatigue and weakness, however pt with difficulty scooting along bed (most likely due to poor motor planning). Therefore retrieved [place] and pt transferred sit<>stand in [place] with min A +2. Noted pt with soiled pants/shirt and pt requested to toilet. Dependent transfer to toilet in [place] and pt transferred stand<>sit with min A. Pt unable to further void despite increased time and reported feeling "gassy". Removed soiled brief/pants and donned clean ones with max A. Removed soiled shirt and donned clean one with mod A. Toilet<>WC in [place] dependently and pt's wife arrived expressing concerns regarding D/C home. Re-educated pt's wife  on recommendation for ramp and discussed time frames for hired care to come assist pt as he has tendency to do worse in the mornings. Attempted x 2 additional stands from [facility] using armrests with min A however pt still unable to come to complete upright position and returning to sit. Concluded session with pt sitting in recliner, needs within reach, and chair pad alarm on. Encouraged pt to eat fruit cup since he reported not eating anything yet today.   Therapy Documentation Precautions:  Precautions Precautions: Fall Precaution Comments: crani Restrictions Weight Bearing Restrictions: No   Therapy/Group: Individual Therapy Alfonse Alpers PT, DPT   09/30/2020, 7:16 AM

## 2020-09-30 NOTE — Progress Notes (Signed)
Occupational Therapy Session Note  Patient Details  Name: Gabriel [name] MRN: [id number] Date of Birth: 05/09/1930  Today's Date: 09/30/2020 Session 1 OT Individual Time: 1033-1117 OT Individual Time Calculation (min): 44 min   OT Missed Time: 16 Minutes Missed Time Reason: Patient fatigue  Session 2 OT Individual Time: 1403-1435 OT Individual Time Calculation (min): 32 min    Short Term Goals: Week 2:  OT Short Term Goal 1 (Week 2): STG = LTG due to LOS  Skilled Therapeutic Interventions/Progress Updates:  Session 1: Met pt sitting in w/c, home aide and wife present, pt agreed to session. Wife and aide expressed concerns with transfer due to fluctuating pt fatigue. Treatment session was focused on family education and sit > stand transfers. OTS demonstrated with pt a stand pivot transfer from w/c > EOB. Pt able to complete transfer with CGA but required cues for L foot due to left inattention. OTS instructed pt to self-pace timing for standing from EOB due to extra time for processing information. Pt stand pivoted from EOB > w/c with no demonstration of fatigue. Pt requested to sit in recliner. OT and OTS offered home aide attempted to transfer pt. Pt unable to complete transfer due to extreme fatigue. Once pt has became fatigued, there is shakiness in both arms. Pt will often express "coming down" and sporadically sit down. Pt rested while OT and OTS educated wife on fatigue fluctuation at home. OT discussed with wife the use of a hospital bed to reduce care partner burden. Pt transferred from w/c to recliner with Min A and expressed "coming down". Pt was left in recliner, chair alarm on, all needs met.   Session 2: Met pt sitting in recliner with wife and daughter present. Treatment session was focused on improving functional sit > stand transfers. Wife urged pt to use bathroom before lying in bed, pt hesitant but eventually agreed. Pt was Min A for initial sit > stand from recliner to RW. Due  to pt fatigue, pt was brought to bathroom in w/c. Pt stand pivoted to w/c to standard toilet due to discomfort using bedside commode. OTS required to doff LB dressing while standing. Pt primarily used grab bars to lower himself onto toilet. Pt required Mod A +2 during squat pivot to w/c from toilet. During transfer, pt began heavily breathing and required a break. OTS donned LB dressing before transferring to w/c. Pt was brought to bed and squat pivoted to bed using bed rails to transfer into bed. Pt was lying supine with HOB elevated, bed alarm on, all needs met.   Therapy Documentation Precautions:  Precautions Precautions: Fall Precaution Comments: crani Restrictions Weight Bearing Restrictions: No General: General OT Amount of Missed Time: 16 Minutes Pain: Pain Assessment Pain Scale: 0-10 Pain Score: 0-No pain Faces Pain Scale: No hurt  Therapy/Group: Individual Therapy  TyQwisha Gwynn 09/30/2020, 3:15 PM 

## 2020-09-30 NOTE — Progress Notes (Signed)
PROGRESS NOTE   Subjective/Complaints:   Pt very tired this am , more fatigued over the last couple days, discussed Hgb with wife and pt  ROS:  Pt denies SOB, abd pain, CP, N/V/C/D, and vision changes, pain, +diffuse weakness, gas pains have resolved, back pain improved, insomnia has improved   Objective:   No results found. Recent Labs    09/30/20 0556  WBC 7.4  HGB 8.1*  HCT 26.0*  PLT 118*     Recent Labs    09/30/20 0556  NA 136  K 4.0  CL 101  CO2 25  GLUCOSE 95  BUN 25*  CREATININE 0.96  CALCIUM 8.3*      Intake/Output Summary (Last 24 hours) at 09/30/2020 1022 Last data filed at 09/30/2020 0421 Gross per 24 hour  Intake --  Output 500 ml  Net -500 ml         Physical Exam: Vital Signs Blood pressure 118/78, pulse 71, temperature 98.5 F (36.9 C), resp. rate 18, weight 83.9 kg, SpO2 95 %.  General: No acute distress Mood and affect are appropriate Heart: Regular rate and rhythm no rubs murmurs or extra sounds Lungs: Clear to auscultation, breathing unlabored, no rales or wheezes Abdomen: Positive bowel sounds, soft nontender to palpation, nondistended Extremities: No clubbing, cyanosis, or edema Skin: No evidence of breakdown, no evidence of rash  Neuro Musculoskeletal:     Cervical back: Normal range of motion and neck supple.     Comments: RUE- 5/5 except finger abd 5-/5 LUE- 5/5 except finger abd 4+/5 RLE- HF 4+/5, KE 4/5, DF and PF 4-/5 LLE- HF 4-/5, KE 4-/5, DF 3-/5 and PF 4/5 --mildly improved ankle motion +tenderness to palpation over thoracic paraspinal Skin:    General: Skin is warm and dry. Crani incision CDI with staples  Neurological:     Comments: alert. Follows basic commands. Fair insight and awareness. Can be tangential. Delayed processing L inattention noted on exam Intact to light touch in all 4 extremities-   Assessment/Plan: 1. Functional deficits which  require 3+ hours per day of interdisciplinary therapy in a comprehensive inpatient rehab setting. Physiatrist is providing close team supervision and 24 hour management of active medical problems listed below. Physiatrist and rehab team continue to assess barriers to discharge/monitor patient progress toward functional and medical goals  Care Tool:  Bathing    Body parts bathed by patient: Right arm, Left arm, Chest, Abdomen, Front perineal area, Right upper leg, Left upper leg, Face   Body parts bathed by helper: Buttocks, Left lower leg, Right lower leg Body parts n/a: Right upper leg, Left upper leg, Right lower leg, Left lower leg (Did not complete due to time constraint)   Bathing assist Assist Level: Minimal Assistance - Patient > 75%     Upper Body Dressing/Undressing Upper body dressing   What is the patient wearing?: Button up shirt    Upper body assist Assist Level: Set up assist    Lower Body Dressing/Undressing Lower body dressing      What is the patient wearing?: Underwear/pull up, Pants     Lower body assist Assist for lower body dressing: Moderate Assistance - Patient  3 - 74%     Toileting Toileting    Toileting assist Assist for toileting: Contact Guard/Touching assist     Transfers Chair/bed transfer  Transfers assist     Chair/bed transfer assist level: Contact Guard/Touching assist Chair/bed transfer assistive device: Walker, Clinical biochemist   Ambulation assist      Assist level: Contact Guard/Touching assist Assistive device: Walker-rolling Max distance: 20   Walk 10 feet activity   Assist     Assist level: Contact Guard/Touching assist Assistive device: Walker-rolling   Walk 50 feet activity   Assist Walk 50 feet with 2 turns activity did not occur: Safety/medical concerns  Assist level: Contact Guard/Touching assist Assistive device: Walker-rolling    Walk 150 feet activity   Assist Walk 150 feet  activity did not occur: Safety/medical concerns  Assist level: Contact Guard/Touching assist Assistive device: Walker-rolling    Walk 10 feet on uneven surface  activity   Assist Walk 10 feet on uneven surfaces activity did not occur: Safety/medical concerns         Wheelchair     Assist Will patient use wheelchair at discharge?: No Type of Wheelchair: Manual    Wheelchair assist level: Supervision/Verbal cueing Max wheelchair distance: 183ft    Wheelchair 50 feet with 2 turns activity    Assist        Assist Level: Supervision/Verbal cueing   Wheelchair 150 feet activity     Assist      Assist Level: Supervision/Verbal cueing   Blood pressure 118/78, pulse 71, temperature 98.5 F (36.9 C), resp. rate 18, weight 83.9 kg, SpO2 95 %.  Medical Problem List and Plan: 1.  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  Status post stereotactic right temporal craniotomy resection of brain metastasis 09/03/2020. Has small 27mm Right frontal mass as well  Follow-up medical oncology as well as radiation oncology with radiation therapy as directed.  Continue Decadron protocol             -patient may shower once staples out             -ELOS/Goals: 12-16 days supervision  -Continue PT, OT and SLP    -After discussion with palliative care patient and wife have decided on DNR status  -provided dietary education to maximize benefits from outpatient immunotherapy.  2.  Antithrombotics: -DVT/anticoagulation: SCDs.             -antiplatelet therapy: N/A 3. Thoracic spinal pain: Lidocaine patch ordered. Added Norco PRN. Improved. Not requiring Norco. Decrease Norco to daily PRN. 4. Situational anxiety: Discontinue Ativan 0.25- no need for scans.              -antipsychotic agents: N/A 5. Neuropsych: This patient is capable of making decisions on his own behalf. 6. Skin/Wound Care: Routine skin checks. Staples d/ced on 6/14- nursing order placed. Site is c/d/i 7.  Fluids/Electrolytes/Nutrition: Routine in and outs with follow-up chemistries 8.  Seizure prophylaxis.  d/c Keppra, completed course 9.  Hypertension.  d/c anti-hypertensives. Resolved. Now well controlled off medications: discussed with wife use of TEDs at home if hypotensive, but appears to be stable.  10.  Hyperlipidemia.  Continue Lipitor Vitals:   09/29/20 1928 09/30/20 0409  BP: 112/77 118/78  Pulse: 75 71  Resp: 18 18  Temp: 98.3 F (36.8 C) 98.5 F (36.9 C)  SpO2: 97% 95%   11.  Constipation.  Continue Senokot twice daily, Colace twice daily, Dulcolax suppository daily as needed. Received sorbitol once.  12. Hemorrhoids: bleeding, painless, drop in Hgb- will call GI 13. Increased weakness/slowed response: Head CT normal. Labs stable. UA/UC normal. Cr better. Magnesium normalized 14. AKI: now resolved, could have been contributing to worsening function: D/c HCTZ and lisinopril. Repeat Monday- resolved  15. Hypomagnesium: supplemented with 2g IV 6/18  Has also helped with BP after stopping two anti-hypertensives.  16. Severe vitamin D deficiency: Continue Ergocalciferol 50,000U weekly x7 weeks 17. Insomnia: Continue Trazodone 100mg  HS PRN  18. Gas: resolved with cessation of Ensure.  19. CODE STATUS: DNR: palliative care involved, recommend cont palliative care as OP 20. Disposition: HFU scheduled with me on 11/22/20 at 2pm. D/c extended 1 week. Updated wife and son. 21  Acute on chronic anemia- hx hemorrhoids pt pt and staff have not noted any blood in stool.  Will repeat hgb this am , if still low will need to confer with GI +/- Onc LOS: 20 days A FACE TO FACE EVALUATION WAS PERFORMED  Gabriel [name] 09/30/2020, 10:22 AM

## 2020-09-30 NOTE — Progress Notes (Signed)
Physical Therapy Discharge Summary  Patient Details  Name: Gabriel [name] MRN: [id number] Date of Birth: 11/02/1930  Patient has met 1 of 10 long term goals due to improved activity tolerance, improved balance, improved postural control, increased strength, ability to compensate for deficits, improved attention, improved awareness, and improved coordination. Patient to discharge at a wheelchair level  CGA/min A .  Patient's care partner requires assistance to provide the necessary physical and cognitive assistance at discharge. Pt continues to fluctuate with his mobility status and can require anywhere from CGA to min A to stand using RW.   Reasons goals not met: Pt did not meet supervision goals as pt currently requires CGA/min A overall due to generalized weakness, deconditioning, fatigue, and decreased balance/postural control.   Recommendation:  Patient will benefit from ongoing skilled PT services in home health setting to continue to advance safe functional mobility, address ongoing impairments in transfers, generalized strengthening, dynamic standing balance/coordination, gait training, NMR, endurance, and to minimize fall risk.  Equipment: RW, 18x18 manual WC  Reasons for discharge: treatment goals met and discharge from hospital  Patient/family agrees with progress made and goals achieved: Yes  PT Discharge Precautions/Restrictions Precautions Precautions: Fall Restrictions Weight Bearing Restrictions: No Cognition Overall Cognitive Status: Impaired/Different from baseline Arousal/Alertness: Awake/alert Orientation Level: Oriented X4 Memory: Impaired Awareness: Appears intact Problem Solving: Appears intact Safety/Judgment: Appears intact Sensation Sensation Light Touch: Appears Intact Proprioception: Appears Intact Coordination Gross Motor Movements are Fluid and Coordinated: No Fine Motor Movements are Fluid and Coordinated: No Coordination and Movement  Description: grossly uncoordinated due to L hemiparesis, generalized weakness and deconditioning, and decreased balance Finger Nose Finger Test: intention tremmor BUE. decreased speed with LUE compared to R Motor  Motor Motor: Hemiplegia;Abnormal postural alignment and control Motor - Skilled Clinical Observations: grossly uncoordinated due to L hemiparesis, generalized weakness and deconditioning, and decreased balance  Mobility Bed Mobility Bed Mobility: Rolling Right;Rolling Left;Sit to Supine;Supine to Sit Rolling Right: Supervision/verbal cueing Rolling Left: Supervision/Verbal cueing Supine to Sit: Minimal Assistance - Patient > 75% Sit to Supine: Minimal Assistance - Patient > 75% Transfers Transfers: Sit to Stand;Stand to Sit;Stand Pivot Transfers Sit to Stand: Contact Guard/Touching assist Stand to Sit: Contact Guard/Touching assist Stand Pivot Transfers: Contact Guard/Touching assist Transfer (Assistive device): Rolling walker Locomotion  Gait Ambulation: Yes Gait Assistance: Contact Guard/Touching assist Gait Distance (Feet): 55 Feet Assistive device: Rolling walker Gait Assistance Details: Verbal cues for precautions/safety;Verbal cues for technique;Verbal cues for gait pattern;Verbal cues for safe use of DME/AE Gait Assistance Details: verbal cues for RW safety, upright posture, and to increased step length Gait Gait: Yes Gait Pattern: Impaired Gait Pattern: Step-to pattern;Decreased step length - right;Decreased step length - left;Decreased stride length;Trunk flexed;Poor foot clearance - right;Poor foot clearance - left;Narrow base of support;Decreased trunk rotation Gait velocity: decreased Stairs / Additional Locomotion Stairs: Yes Stairs Assistance: Minimal Assistance - Patient > 75% Stair Management Technique: Two rails Number of Stairs: 1 Height of Stairs: 6 Wheelchair Mobility Wheelchair Mobility: Yes Wheelchair Assistance: Dentist: Both upper extremities Wheelchair Parts Management: Needs assistance Distance: 157f  Trunk/Postural Assessment  Cervical Assessment Cervical Assessment: Exceptions to [facility]erv[facility] kyph[facility]) Thoracic Assessment Thoracic Assessment: Exceptions to [facility]) Lumbar Assessment Lumbar Assessment: Exceptions to [facility](posterior pelvic tilt) Postural Control Postural Control: Deficits on evaluation  Balance Balance Balance Assessed: Yes Static Sitting Balance Static Sitting - Balance Support: Feet supported;Bilateral upper extremity supported Static Sitting - Level of Assistance: 5: Stand by assistance (supervision)  Dynamic Sitting Balance Dynamic Sitting - Balance Support: Feet supported;Bilateral upper extremity supported Dynamic Sitting - Level of Assistance: 5: Stand by assistance (supervision) Static Standing Balance Static Standing - Balance Support: Bilateral upper extremity supported (RW) Static Standing - Level of Assistance: 5: Stand by assistance (supervision) Dynamic Standing Balance Dynamic Standing - Balance Support: Bilateral upper extremity supported (RW) Dynamic Standing - Level of Assistance: 5: Stand by assistance (CGA) Extremity Assessment  RLE Assessment RLE Assessment: Exceptions to [facility] Strength Comments: functionally 4-/5 LLE Assessment LLE Assessment: Exceptions to [facility] General Strength Comments: functionally 4/5  Alfonse Alpers PT, DPT  09/30/2020, 12:17 PM

## 2020-09-30 NOTE — Progress Notes (Signed)
Speech Language Pathology Daily Session Note  Patient Details  Name: Gabriel [name] MRN: [id number] Date of Birth: October 05, 1930  Today's Date: 09/30/2020 SLP Individual Time: 1300-1345 SLP Individual Time Calculation (min): 45 min  Short Term Goals: Week 3: SLP Short Term Goal 1 (Week 3): Pt will demonstrate mildy complex problem solving skills in familiar tasks with min A verbal cues. SLP Short Term Goal 2 (Week 3): Pt will demonstrate self-monitoring in basic problem solving tasks with Sup A verbal cues SLP Short Term Goal 3 (Week 3): Pt will demonstrate recall of novel, functional information with min A verbal cues for compensatory aids.  Skilled Therapeutic Interventions: Skilled ST intervention performed with focus on cognition. SLP facilitated error analysis and problem solving through medication management task. Patient identified organization errors within weekly am/pm pillbox with 25% accuracy with no cues, progressing to 75% accuracy with Mod A verbal and visual cues fading to Min A with additional processing time. He demonstrated comprehension of various medication labels with 57% (4/7) accuracy progressing to 85% accuracy with min-to-mod A verbal and visual cues. He responded to problem solving scenarios about medication management with min-to-mod A verbal cues and additional processing time. Educated patient's daughter and spouse on patient's progress and current needs from a cognitive perspective. Patient was left in chair with alarm activated, needs within reach, and family at bedside. Continue per ST POC.    Pain Pain Assessment Pain Scale: 0-10 Pain Score: 0-No pain Faces Pain Scale: No hurt  Therapy/Group: Individual Therapy  Patty Sermons 09/30/2020, 1:47 PM

## 2020-10-01 ENCOUNTER — Ambulatory Visit: Payer: Medicare Other | Admitting: Internal Medicine

## 2020-10-01 LAB — GLUCOSE, CAPILLARY
Glucose-Capillary: 105 mg/dL — ABNORMAL HIGH (ref 70–99)
Glucose-Capillary: 121 mg/dL — ABNORMAL HIGH (ref 70–99)
Glucose-Capillary: 160 mg/dL — ABNORMAL HIGH (ref 70–99)

## 2020-10-01 MED ORDER — DOCUSATE SODIUM 50 MG/5ML PO LIQD: 100.0000 mg | Freq: Two times a day (BID) | ORAL | 0 refills | Status: AC

## 2020-10-01 MED ORDER — HYDROCODONE-ACETAMINOPHEN 5-325 MG PO TABS: 1.0000 | ORAL_TABLET | Freq: Every day | ORAL | 0 refills | Status: AC | PRN

## 2020-10-01 MED ORDER — ACETAMINOPHEN 325 MG PO TABS: 650.0000 mg | ORAL_TABLET | ORAL | Status: AC | PRN

## 2020-10-01 MED ORDER — MIRTAZAPINE 7.5 MG PO TABS: 7.5000 mg | ORAL_TABLET | Freq: Every day | ORAL | 0 refills | Status: DC

## 2020-10-01 MED ORDER — VITAMIN D (ERGOCALCIFEROL) 1.25 MG (50000 UNIT) PO CAPS: 50000.0000 [IU] | ORAL_CAPSULE | ORAL | 0 refills | Status: AC

## 2020-10-01 MED ORDER — LIDOCAINE 5 % EX PTCH: 1.0000 | MEDICATED_PATCH | CUTANEOUS | 0 refills | Status: AC

## 2020-10-01 MED ORDER — PANTOPRAZOLE SODIUM 40 MG PO TBEC: 40.0000 mg | DELAYED_RELEASE_TABLET | Freq: Every day | ORAL | 0 refills | Status: DC

## 2020-10-01 MED ORDER — DEXAMETHASONE 2 MG PO TABS: 2.0000 mg | ORAL_TABLET | Freq: Three times a day (TID) | ORAL | 0 refills | Status: AC

## 2020-10-01 MED ORDER — ATORVASTATIN CALCIUM 40 MG PO TABS: 40.0000 mg | ORAL_TABLET | Freq: Every day | ORAL | 1 refills | Status: AC

## 2020-10-01 NOTE — Progress Notes (Signed)
Physical Therapy Session Note  Patient Details  Name: Gabriel [name] MRN: [id number] Date of Birth: 06-Aug-1930  Today's Date: 10/01/2020 PT Individual Time: 7371-0626 PT Individual Time Calculation (min): 68 min   Short Term Goals: Week 3:  PT Short Term Goal 1 (Week 3): STG=LTG due to extended LOS  Skilled Therapeutic Interventions/Progress Updates:    Pt received sitting in w/c with his wife and daughter present for hands-on education/training. Pt agreeable to therapy session.  Pt's wife reports they have 2 steps with B HRs (wide), followed by 1step (same HRs), and then 1 step-up onto the porch with wide B HRs to get into the house. Pt's wife reports they are not able to push pt in w/c through grass due to it being on a hill. Therapist re-iterated primary PT's recommendation of needing a ramp - wife reported that the home's HOA will not allow a permanent ramp; therefore, discussed a portable one (educated on ensuring it is proper size, length, and weight limit). Plan is for pt to D/C home via medical transport - educated family that at this time pt will need medical transport until a ramp is installed.  Pt's wife confirmed they have received all recommended DME including: 18x18 w/c, RW, and hospital bed.  Therapist provided education/training on the following: - proper placement and use of gait belt - proper positioning to guard/assist pt during transfers and ambulation - importance of providing pt simple, concise cuing - placing heavy,sturdy chairs no more than 67ft apart in the home to allow a safe place for pt to sit and rest from ambulating - option of purchasing a stedy to be used on the day's pt's functional mobility level is lower, due to pt's fluctuating presentation   Transported to/from gym in w/c for time management and energy conservation. Sit<>stands to/from w/c during session using RW with CGA - pt demos carryover of education/training to scoot forward and push up from  armrests without cuing.Pt ambulated in ADL apartment ~18ft (including over threshold 2x and 1, 180 degree turn) using RW with therapist providing CGA/min assist initially transitioning to pt's wife providing hands-on assistance - therapist provided cuing/education on proper placement for assistance - pt noted to have decreased B LE step lengths and foot clearances (L more impaired than R). Educated pt/wife on recommendation to have pt turn towards L when going to sit in order to allow pt's wife to stay in proper positioning to provide assistance more easily, when this is possible - this recommendation was made due to pt demoing L lean when turning towards R to sit in w/c due to poor ability to step L LE during transfer.  Educated pt/family on current recommendation, from primary PT, for standard wheelchair and educated them on option of custom wheelchair if needed in future and to discuss with insurance on DME limitations.  Pt performed simulated (small SUV height) car transfer via stand pivot from w/c>car using RW with min assist for balance while turning but once in the car pt becoming extremely fatigued requiring max assist for trunk control to prevent posterior lean and then L squat pivot mod assist back to w/c for safety. Pt states "I have no energy left." Transported back up to room and pt requesting to return to bed. R stand pivot w/c>EOB using RW with CGA. Sit>supine min assist for B LE management onto bed. Pt/family report no additional questions/concerns at this time. Pt left supine in bed with needs in reach, bed alarm on, and family present.  Therapy Documentation Precautions:  Precautions Precautions: Fall Precaution Comments: crani Restrictions Weight Bearing Restrictions: No   Pain: No reports of pain throughout session.   Therapy/Group: Individual Therapy  Tawana Scale , PT, DPT, CSRS 10/01/2020, 8:03 AM

## 2020-10-01 NOTE — Progress Notes (Signed)
PROGRESS NOTE   Subjective/Complaints: No complaints this morning Working with SLP Back pain well controlled Slept well last night  ROS:  Pt denies SOB, abd pain, CP, N/V/C/D, and vision changes, pain, +diffuse weakness, gas pains have resolved, back pain improved, insomnia- improved   Objective:   No results found. Recent Labs    09/30/20 0556 09/30/20 1119  WBC 7.4  --   HGB 8.1* 9.3*  HCT 26.0* 29.8*  PLT 118*  --      Recent Labs    09/30/20 0556  NA 136  K 4.0  CL 101  CO2 25  GLUCOSE 95  BUN 25*  CREATININE 0.96  CALCIUM 8.3*      Intake/Output Summary (Last 24 hours) at 10/01/2020 0957 Last data filed at 10/01/2020 0842 Gross per 24 hour  Intake 540 ml  Output 100 ml  Net 440 ml        Physical Exam: Vital Signs Blood pressure 105/64, pulse 62, temperature 98.1 F (36.7 C), temperature source Oral, resp. rate 16, weight 83.9 kg, SpO2 98 %. Gen: no distress, normal appearing HEENT: oral mucosa pink and moist, NCAT Cardio: Reg rate Chest: normal effort, normal rate of breathing Abd: soft, non-distended Ext: no edema Psych: pleasant, normal affect Skin: intact Neuro Musculoskeletal:     Cervical back: Normal range of motion and neck supple.     Comments: RUE- 5/5 except finger abd 5-/5 LUE- 5/5 except finger abd 4+/5 RLE- HF 4+/5, KE 4/5, DF and PF 4-/5 LLE- HF 4-/5, KE 4-/5, DF 3-/5 and PF 4/5 --mildly improved ankle motion +tenderness to palpation over thoracic paraspinal Skin:    General: Skin is warm and dry. Crani incision CDI with staples  Neurological:     Comments: alert. Follows basic commands. Fair insight and awareness. Can be tangential. Delayed processing L inattention noted on exam Intact to light touch in all 4 extremities-   Assessment/Plan: 1. Functional deficits which require 3+ hours per day of interdisciplinary therapy in a comprehensive inpatient rehab  setting. Physiatrist is providing close team supervision and 24 hour management of active medical problems listed below. Physiatrist and rehab team continue to assess barriers to discharge/monitor patient progress toward functional and medical goals  Care Tool:  Bathing    Body parts bathed by patient: Right arm, Left arm, Chest, Abdomen, Front perineal area, Right upper leg, Left upper leg, Face, Right lower leg, Left lower leg   Body parts bathed by helper: Buttocks Body parts n/a: Right upper leg, Left upper leg, Right lower leg, Left lower leg (Did not complete due to time constraint)   Bathing assist Assist Level: Minimal Assistance - Patient > 75%     Upper Body Dressing/Undressing Upper body dressing   What is the patient wearing?: Button up shirt    Upper body assist Assist Level: Set up assist    Lower Body Dressing/Undressing Lower body dressing      What is the patient wearing?: Underwear/pull up, Pants     Lower body assist Assist for lower body dressing: Moderate Assistance - Patient 50 - 74%     Toileting Toileting    Toileting assist Assist for  toileting: Minimal Assistance - Patient > 75%     Transfers Chair/bed transfer  Transfers assist     Chair/bed transfer assist level: Minimal Assistance - Patient > 75% Chair/bed transfer assistive device: Walker, Clinical biochemist   Ambulation assist      Assist level: Contact Guard/Touching assist Assistive device: Walker-rolling Max distance: 20   Walk 10 feet activity   Assist     Assist level: Contact Guard/Touching assist Assistive device: Walker-rolling   Walk 50 feet activity   Assist Walk 50 feet with 2 turns activity did not occur: Safety/medical concerns  Assist level: Contact Guard/Touching assist Assistive device: Walker-rolling    Walk 150 feet activity   Assist Walk 150 feet activity did not occur: Safety/medical concerns  Assist level: Contact  Guard/Touching assist Assistive device: Walker-rolling    Walk 10 feet on uneven surface  activity   Assist Walk 10 feet on uneven surfaces activity did not occur: Safety/medical concerns         Wheelchair     Assist Will patient use wheelchair at discharge?: No Type of Wheelchair: Manual    Wheelchair assist level: Supervision/Verbal cueing Max wheelchair distance: 136ft    Wheelchair 50 feet with 2 turns activity    Assist        Assist Level: Supervision/Verbal cueing   Wheelchair 150 feet activity     Assist      Assist Level: Supervision/Verbal cueing   Blood pressure 105/64, pulse 62, temperature 98.1 F (36.7 C), temperature source Oral, resp. rate 16, weight 83.9 kg, SpO2 98 %.  Medical Problem List and Plan: 1.  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  Status post stereotactic right temporal craniotomy resection of brain metastasis 09/03/2020. Has small 79mm Right frontal mass as well  Follow-up medical oncology as well as radiation oncology with radiation therapy as directed.  Continue Decadron protocol             -patient may shower once staples out             -ELOS/Goals: 22 days supervision  -Continue PT, OT and SLP    -After discussion with palliative care patient and wife have decided on DNR status  -provided dietary education to maximize benefits from outpatient immunotherapy.  2.  Antithrombotics: -DVT/anticoagulation: SCDs.             -antiplatelet therapy: N/A 3. Thoracic spinal pain: Lidocaine patch ordered. Added Norco PRN. Improved. Not requiring Norco. Decrease Norco to daily PRN- maintain this dose 4. Situational anxiety: Discontinue Ativan 0.25- no need for scans.              -antipsychotic agents: N/A 5. Neuropsych: This patient is capable of making decisions on his own behalf. 6. Skin/Wound Care: Routine skin checks. Staples d/ced on 6/14- nursing order placed. Site is c/d/i 7. Fluids/Electrolytes/Nutrition: Routine  in and outs with follow-up chemistries 8.  Seizure prophylaxis.  d/c Keppra, completed course 9.  Hypertension.  d/c anti-hypertensives. Resolved. Now well controlled off medications: discussed with wife use of TEDs at home if hypotensive, but appears to be stable.  10.  Hyperlipidemia.  Continue Lipitor Vitals:   09/30/20 1931 10/01/20 0512  BP: 109/63 105/64  Pulse: 81 62  Resp: 18 16  Temp: 98.3 F (36.8 C) 98.1 F (36.7 C)  SpO2: 96% 98%   11.  Constipation.  Continue Senokot twice daily, Colace twice daily, Dulcolax suppository daily as needed. Received sorbitol once.  12. Hemorrhoids: bleeding, painless,  discussed hgb stable, repeat Monday 13. Increased weakness/slowed response: Head CT normal. Labs stable. UA/UC normal. Cr better. Magnesium normalized 14. AKI: now resolved, could have been contributing to worsening function: D/c HCTZ and lisinopril. Repeat Monday 15. Hypomagnesium: supplemented with 2g IV 6/18  Has also helped with BP after stopping two anti-hypertensives.  16. Severe vitamin D deficiency: Continue Ergocalciferol 50,000U weekly x7 weeks 17. Insomnia: Continue Trazodone 100mg  HS PRN  18. Gas: resolved with cessation of Ensure.  19. CODE STATUS: DNR: palliative care involved, recommend cont palliative care as OP 20. Disposition: HFU scheduled with me on 11/22/20 at 2pm. D/c extended 1 week, d/c 6/29. Updated wife and son.  LOS: 21 days A FACE TO FACE EVALUATION WAS PERFORMED  Clide Deutscher Tenisha Fleece 10/01/2020, 9:57 AM

## 2020-10-01 NOTE — Patient Care Conference (Addendum)
Inpatient RehabilitationTeam Conference and Plan of Care Update Date: 10/01/2020   Time: 13:45 PM    Patient Name: Gabriel [name]      Medical Record Number: [id number]  Date of Birth: 1931-03-11 Sex: Male         Room/Bed: 7J69C/7E93Y-10 Payor Info: Payor: Theme park manager MEDICARE / Plan: [facility] MEDICARE / Product Type: *No Product type* /    Admit Date/Time:  09/10/2020  6:54 PM  Primary Diagnosis:  Metastatic melanoma [facility])  Hospital Problems: Principal Problem:   Metastatic melanoma [facility]) Active Problems:   Palliative care by specialist    Expected Discharge Date: Expected Discharge Date: 10/02/20  Team Members Present: Physician leading conference: Dr. Leeroy Cha Care Coodinator Present: Dorien Chihuahua, RN, BSN, CRRN;Becky Dupree, LCSW Nurse Present: Dorien Chihuahua, RN PT Present: Ginnie Smart, PT OT Present: Simonne Come, OT SLP Present: Charolett Bumpers, SLP PPS Coordinator present : Gunnar Fusi, SLP     Current Status/Progress Goal Weekly Team Focus  Bowel/Bladder   Pt continent of urine and stool with intermittent incontinence of both. LBM 09/30/2020  Complete continence. Regular BMs every 3 days or less  Assess B/B every shift   Swallow/Nutrition/ Hydration             ADL's   Min - Mod A for ADLs, pt's assist varies due to fluctuation of fatigue  Min A  ADL retraining, functional transfer training, pt and family education, activity tolerance/endurance   Mobility   Pt continues to fluctuate. Bed mobility min A, transfers with RW mod A/CGA depending on fatigue, gait 82ft with RW and CGA, 1 step 2 rails min A.  supervision  functional mobility/transfers, generalized strengthening, dynamic sitting/standing balance/coordination, gait, family educatoin, D/C planning, and improved endurance to activity.   Communication             Safety/Cognition/ Behavioral Observations  Min A  Supervision-to-min A  problem solving, sustained attention, functional recall    Pain   Pt consistently denies pain. Last 3 days refusing scheduled lidoderm patches  Pain scale <3/10  Assess pain every shift and PRN   Skin   skin tear to left hand. Healed craniotomy incision  No new skin breakdown  Assess skin every shift and PRN     Discharge Planning:  Wife in yesterday for education and again today to complete, aware pt flucuates with his level of care and have hired assist to help with this. Prepared for going home tomorrow.   Team Discussion: BP controlled. Progress limited by fatigue; lends to fluctuating functional status Patient on target to meet rehab goals: yes, currently CGA - Mod assist with RW up to 150' and able to manage steps with +2 assist. Min - supervision level overall. *See Care Plan and progress notes for long and short-term goals.   Revisions to Treatment Plan:  Working on Building services engineer, attention, memory, problem solving and safety awareness  Teaching Needs: Family education with wife  Current Barriers to Discharge: Decreased caregiver support and Home enviroment access/layout  Possible Resolutions to Barriers: HH follow up services recommended Hospital bed and lift chair recommended     Medical Summary Current Status: back pain has resolved, hypotension has resolved, AKI has resolved, sleeping much better, metastatic melanoma  Barriers to Discharge: Medical stability  Barriers to Discharge Comments: fatigue, back pain, AKI, metastatic melanoma Possible Resolutions to Barriers/Weekly Focus: continue vitamin D supplement, continue lidocaine patch and Norco PRN, continue hydration and monitor creatinine outpatient, provided dietary education to maximize  success with outpatient immunotherapy   Continued Need for Acute Rehabilitation Level of Care: The patient requires daily medical management by a physician with specialized training in physical medicine and rehabilitation for the following reasons: Direction of a  multidisciplinary physical rehabilitation program to maximize functional independence : Yes Medical management of patient stability for increased activity during participation in an intensive rehabilitation regime.: Yes Analysis of laboratory values and/or radiology reports with any subsequent need for medication adjustment and/or medical intervention. : Yes   I attest that I was present, lead the team conference, and concur with the assessment and plan of the team.   Dorien Chihuahua B 10/01/2020, 3:22 PM

## 2020-10-01 NOTE — Progress Notes (Addendum)
Patient ID: Gabriel [name], male   DOB: January 19, 1931, 87 y.o.   MRN: [id number] Met with and aware of discharge tomorrow ambulance set up for 10:00 and wife and children to be here at 9:00 to go over DC instructions. Pt feels will be in a better place mentally at home and has been in a hospital too long this time. Wife and children to be here in the am. Called front desk to allow all three to come in wife, daughter and son tomorrow am.

## 2020-10-01 NOTE — Progress Notes (Addendum)
Speech Language Pathology Discharge Summary  Patient Details  Name: Gabriel [name] MRN: [id number] Date of Birth: [birth year]  Today's Date: 10/01/2020 SLP Individual Time: 1500-1530 SLP Individual Time Calculation (min): 30 min   Skilled Therapeutic Interventions:   Session 1: Skilled ST treatment performed with emphasis on cognitive goals. SLP facilitated sustained attention and problem solving activity with Min A verbal cues fading to supervision A semantic cues and extended processing time. Patient required increased processing time following ~12 minute duration. Patient was left in wheelchair with alarm activated and needs within reach.   Session 2: Skilled ST treatment performed with emphasis on cognitive goals. SLP facilitated sustained attention, problem solving, and short-term memory task with Min A verbal cues fading to supervision A verbal cues for memory. Consistently Min A verbal cues for problem solving. Patient displayed ability to attend to task for duration of session with minimal re-direction. Improved cognitive endurance and processing as compared to previous session. Patient was left in bed with alarm activated and needs within reach. Plan to proceed with discharge from services.   Patient has met 4 of 4 long term goals.  Patient to discharge at overall Supervision level.   Reasons goals not met: NA  Clinical Impression/Discharge Summary: Patient has made consistent functional gains and has met 4 of 4 LTGs this admission. Patient currently requiring supervision-to-min A verbal and visual cues for attention, problem solving, short-term recall, and information processing. Min A required for error awareness. Patient benefits from additional processing time for mildly complex tasks. Sustained attention appears to fluctuate depending on patient's alertness and benefits from low stimuli environment. Patient is discharging at overall supervision level from a cognitive perspective.  Patient and family education is complete and patient will discharge home with 24 hour supervision from family. Patient would benefit from f/u SLP services to maximize cognitive function and independence.   Care Partner:  Caregiver Able to Provide Assistance: Yes  Type of Caregiver Assistance: Physical;Cognitive  Recommendation:  Home Health SLP;24 hour supervision/assistance;Outpatient SLP  Rationale for SLP Follow Up: Maximize cognitive function and independence;Reduce caregiver burden   Equipment: none at this time   Reasons for discharge: Treatment goals met   Patient/Family Agrees with Progress Made and Goals Achieved: Yes    Matheus Spiker T Chavis Tessler 10/01/2020, 4:20 PM

## 2020-10-01 NOTE — Plan of Care (Signed)
Patient incontinent despite toileting and prompted evacuation; using briefs

## 2020-10-01 NOTE — Progress Notes (Signed)
Nutrition Follow-up  DOCUMENTATION CODES:   Not applicable  INTERVENTION:  Recommend regular diet upon discharge home.    Encourage adequate PO intake.   NUTRITION DIAGNOSIS:   Increased nutrient needs related to cancer and cancer related treatments as evidenced by estimated needs; ongoing  GOAL:   Patient will meet greater than or equal to 90% of their needs; progressed  MONITOR:   PO intake, Supplement acceptance, Skin, Weight trends, Labs, I & O's  REASON FOR ASSESSMENT:   Malnutrition Screening Tool    ASSESSMENT:   87 year old male with history of hyperlipidemia, hypertension, CVA. Patient recently diagnosed stage IV melanoma findings of large lung mass. CT scan revealed left upper lobe mass. Renal masses concerning for primary renal disease versus metastatic disease.  MRI of the brain demonstrated 3 intracranial metastases including 2 small subcentimeter metastases and a larger right hemorrhagic lesion. PET scan completed showing hypermetabolic lingular mass with central necrosis consistent with primary bronchogenic carcinoma. Patient underwent stereotactic right temporal craniotomy for resection of brain metastasis. Surgical pathology report consistent with metastatic melanoma. Pt with decreased functional mobility admitted to CIR.  Meal completion has been 75-100% recently. Appetite and intake has improved. Plans for discharge home tomorrow. Recommend regular diet upon discharge. Labs and medications reviewed.   Diet Order:   Diet Order             Diet Carb Modified Fluid consistency: Thin; Room service appropriate? Yes  Diet effective now                   EDUCATION NEEDS:   Not appropriate for education at this time  Skin:  Skin Assessment: Skin Integrity Issues: Skin Integrity Issues:: Incisions Incisions: head  Last BM:  6/27  Height:   Ht Readings from Last 1 Encounters:  09/03/20 5\' 10"  (1.778 m)    Weight:   Wt Readings from Last 1  Encounters:  09/10/20 83.9 kg   BMI:  Body mass index is 26.54 kg/m.  Estimated Nutritional Needs:   Kcal:  1610-9604  Protein:  90-105 grams  Fluid:  >/= 1.9 L/day  Corrin Parker, MS, RD, LDN RD pager number/after hours weekend pager number on Amion.

## 2020-10-01 NOTE — Progress Notes (Signed)
Occupational Therapy Discharge Summary  Patient Details  Name: Gabriel [name] MRN: [id number] Date of Birth: [birth year]  Today's Date: 10/01/2020  Patient has met 9 of 10 long term goals due to improved activity tolerance, improved balance, postural control, improved awareness, and improved coordination.  Patient to discharge at [facility] Assist level. Patient's assist level often fluctuates. Patient's family has hired morning and night care to assist with ADLs and functional transfers. Patient and patient's family have been educated on energy conservation techniques, and alternatives to complete ADLs and functional transfers due to patient's fluctuation. Patient's care partner requires assistance to provide the necessary physical assistance at discharge.    Reasons goals not met: N/A  Recommendation:  Patient will benefit from ongoing skilled OT services in home health setting to continue to advance functional skills in the area of BADL, iADL, and Reduce care partner burden.  Equipment: 3-in-1 BSC  Reasons for discharge: treatment goals met  Patient/family agrees with progress made and goals achieved: Yes  OT Discharge Precautions/Restrictions  Precautions Precautions: Fall Restrictions Weight Bearing Restrictions: No Pain Pain Assessment Pain Scale: 0-10 Pain Score: 0-No pain Faces Pain Scale: No hurt ADL ADL Eating: Set up Where Assessed-Eating: Edge of bed Grooming: Contact guard Where Assessed-Grooming: Sitting at sink, Wheelchair Upper Body Bathing: Supervision/safety Where Assessed-Upper Body Bathing: Sitting at sink, Wheelchair Lower Body Bathing: Contact guard Where Assessed-Lower Body Bathing: Sitting at sink, Wheelchair Upper Body Dressing: Setup Where Assessed-Upper Body Dressing: Wheelchair Lower Body Dressing: Minimal assistance Where Assessed-Lower Body Dressing: Wheelchair Toileting: Minimal assistance Where Assessed-Toileting: Glass blower/designer:  Minimal assistance, Minimal verbal cueing Toilet Transfer Method: Ambulating, Stand pivot, Squat pivot Toilet Transfer Equipment: Grab bars Tub/Shower Transfer: Unable to assess Social research officer, government: Minimal assistance Social research officer, government Method: Stand pivot, Print production planner with back, Grab bars Vision Baseline Vision/History: Wears glasses Wears Glasses: Reading only Patient Visual Report: No change from baseline Eye Alignment: Within Functional Limits Ocular Range of Motion: Within Functional Limits Convergence: Within functional limits Perception  Perception: Within Functional Limits Praxis Praxis: Impaired Praxis Impairment Details: Motor planning Cognition Overall Cognitive Status: Impaired/Different from baseline Arousal/Alertness: Awake/alert Orientation Level: Oriented X4 Attention: Sustained;Focused Focused Attention: Appears intact Sustained Attention: Appears intact Memory: Impaired Awareness: Appears intact Problem Solving: Appears intact Safety/Judgment: Appears intact Sensation Sensation Light Touch: Appears Intact Hot/Cold: Appears Intact Proprioception: Appears Intact Stereognosis: Appears Intact Coordination Gross Motor Movements are Fluid and Coordinated: No Fine Motor Movements are Fluid and Coordinated: No Coordination and Movement Description: mild temmor in BUE Motor  Motor Motor: Hemiplegia;Abnormal postural alignment and control Motor - Skilled Clinical Observations: grossly uncoordinated due to L hemiparesis, generalized weakness and deconditioning, and decreased balance Mobility  Bed Mobility Bed Mobility: Rolling Right;Rolling Left;Sit to Supine;Supine to Sit Rolling Right: Supervision/verbal cueing Rolling Left: Supervision/Verbal cueing Supine to Sit: Minimal Assistance - Patient > 75% Sit to Supine: Minimal Assistance - Patient > 75% Transfers Sit to Stand: Contact Guard/Touching assist Stand to  Sit: Contact Guard/Touching assist  Trunk/Postural Assessment  Cervical Assessment Cervical Assessment: Exceptions to [employer] Thoracic Assessment Thoracic Assessment: Exceptions to [facility] Lumbar Assessment Lumbar Assessment: Exceptions to [facility] Postural Control Postural Control: Deficits on evaluation  Balance Balance Balance Assessed: Yes Static Sitting Balance Static Sitting - Balance Support: Feet supported;Bilateral upper extremity supported Static Sitting - Level of Assistance: 5: Stand by assistance Dynamic Sitting Balance Dynamic Sitting - Balance Support: Feet supported;Bilateral upper extremity supported Dynamic Sitting - Level of Assistance: 5: Stand by  assistance Dynamic Sitting - Balance Activities: Forward lean/weight shifting;Reaching for objects Static Standing Balance Static Standing - Balance Support: Bilateral upper extremity supported Static Standing - Level of Assistance: 5: Stand by assistance Dynamic Standing Balance Dynamic Standing - Balance Support: Bilateral upper extremity supported Dynamic Standing - Level of Assistance: 5: Stand by assistance Dynamic Standing - Balance Activities: Lateral lean/weight shifting;Forward lean/weight shifting Extremity/Trunk Assessment RUE Assessment RUE Assessment: Within Functional Limits General Strength Comments: grossly 4+/5 LUE Assessment LUE Assessment: Within Functional Limits General Strength Comments: grossly 4+/5   Karalyne Nusser 10/01/2020, 3:27 PM

## 2020-10-01 NOTE — Progress Notes (Signed)
Occupational Therapy Session Note  Patient Details  Name: Gabriel [name] MRN: [id number] Date of Birth: 09-04-1930  Today's Date: 10/01/2020 Session 1 OT Individual Time: 9702-6378 OT Individual Time Calculation (min): 57 min   Session 2 OT Individual Time: 1031-1105 OT Individual Time Calculation (min): 34 min    Short Term Goals: Week 2:  OT Short Term Goal 1 (Week 2): STG = LTG due to LOS  Skilled Therapeutic Interventions/Progress Updates:  Session 1: Met pt lying with HOB elevated, bed alarm on, pt finishing breakfast. Treatment session was focused on self-care retraining and functional transfers. OTS offered self-care sit > standing at sink, pt agreed. Pt reported using urinal but became incontinent on self. Pt was CGA for bed mobility primarily using grab bars to bring UB upright. Pt stand pivoted to w/c with CGA. Pt doffed UB dressing with Min A requiring assist to bring shirt off shoulders. Pt completed UB bathing with set-up assist. Pt slightly squatted using sink for stability to raise bottom to doff incontinence brief. Required 3 sit > squats to doff brief. Pt bathed front perineal area with set-up assist. Pt able to maintain standing balance by stabilizing on sink while having assist with bathing bottom. Pt donned LB dressing and footwear with Mod A with sit > stand due to risk of fatigue fluctuation. Pt donned UB dressing with touch assist to bring shirt over shoulder. OTS provided option to either transfer to recliner or complete oral care to maximize participation and limit fatigue. Pt requested oral care while sitting with set-up assist. Throughout session, pt had enough energy to tolerate entire session without a rest break. Left pt sitting in w/c, safety belt on, all needs met.   Session 2: Met pt sitting in w/c, safety belt on, wife and daughter present. Treatment session was focused on functional trasnfers and family education. OTS encouraged wife to complete all transfers  throughout session with OTS close-by. Wife brought pt to sink to simulate ADLs while sitting at sink. Pt stood for LB bathing and dressing simulation with CGA stabilizing on sink. Pt was brought to bed in w/c, OTS demonstrated safe transfer for wife. Pt stand pivoted with RW with wife to bed with CGA. OT and OTS educated family on allowing pt to independently do as much as possible but do not push limits. Pt required a cue to self-adjust in bed. Pt brought himself upright using grab bars to sitting EOB. Pt stand pivoted to w/c with CGA from wife. Wife and daughter were pleasantly surprised on how well pt was participating during session. OTS inquired from wife and daughter if there were any further concerns. Wife acknowledged assist fluctuation and will "roll with the punches". Left pt sitting in w/c with all needs met.   Therapy Documentation Precautions:  Precautions Precautions: Fall Precaution Comments: crani Restrictions Weight Bearing Restrictions: No Pain: Pain Assessment Pain Scale: 0-10 Pain Score: 0-No pain Faces Pain Scale: No hurt   Therapy/Group: Individual Therapy  Mavric Cortright 10/01/2020, 11:29 AM

## 2020-10-02 LAB — GLUCOSE, CAPILLARY: Glucose-Capillary: 128 mg/dL — ABNORMAL HIGH (ref 70–99)

## 2020-10-02 NOTE — Progress Notes (Signed)
Patient ID: Gabriel [name], male   DOB: [birth year], 87 y.o.   MRN: [id number]  Pt d/c packet left at [place], [place]

## 2020-10-02 NOTE — Progress Notes (Signed)
PROGRESS NOTE   Subjective/Complaints:  No issues overnite family here for d/c instructions  ROS:  Pt denies CP, SOB, N/V/D   Objective:   No results found. Recent Labs    09/30/20 0556 09/30/20 1119  WBC 7.4  --   HGB 8.1* 9.3*  HCT 26.0* 29.8*  PLT 118*  --       Recent Labs    09/30/20 0556  NA 136  K 4.0  CL 101  CO2 25  GLUCOSE 95  BUN 25*  CREATININE 0.96  CALCIUM 8.3*       Intake/Output Summary (Last 24 hours) at 10/02/2020 0838 Last data filed at 10/02/2020 0818 Gross per 24 hour  Intake 360 ml  Output 675 ml  Net -315 ml         Physical Exam: Vital Signs Blood pressure (!) 119/54, pulse 61, temperature 97.8 F (36.6 C), resp. rate 18, weight 84.4 kg, SpO2 90 %.  General: No acute distress Mood and affect are appropriate Heart: Regular rate and rhythm no rubs murmurs or extra sounds Lungs: Clear to auscultation, breathing unlabored, no rales or wheezes Abdomen: Positive bowel sounds, soft nontender to palpation, nondistended Extremities: No clubbing, cyanosis, or edema Skin: No evidence of breakdown, no evidence of rash ilateral upper and lower extremities Musculoskeletal: Full range of motion in all 4 extremities. No joint swelling  Neuro Musculoskeletal:     Cervical back: Normal range of motion and neck supple.     Comments: RUE- 5/5 except finger abd 5-/5 LUE- 5/5 except finger abd 4+/5 RLE- HF 4+/5, KE 4/5, DF and PF 4-/5 LLE- HF 4-/5, KE 4-/5, DF 3-/5 and PF 4/5 --mildly improved ankle motion +tenderness to palpation over thoracic paraspinal Skin:    General: Skin is warm and dry. Crani incision CDI with staples  Neurological:     Comments: alert. Follows basic commands. Fair insight and awareness. Can be tangential. Delayed processing L inattention noted on exam Intact to light touch in all 4 extremities-   Assessment/Plan: 1. 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 Stable for D/C today F/u PCP in 3-4 weeks F/u PM&R 2 weeks See D/C summary See D/C instructions   Care Tool:  Bathing    Body parts bathed by patient: Right arm, Left arm, Chest, Abdomen, Front perineal area, Right upper leg, Left upper leg, Face, Right lower leg, Left lower leg   Body parts bathed by helper: Buttocks Body parts n/a: Right upper leg, Left upper leg, Right lower leg, Left lower leg (Did not complete due to time constraint)   Bathing assist Assist Level: Minimal Assistance - Patient > 75%     Upper Body Dressing/Undressing Upper body dressing   What is the patient wearing?: Button up shirt    Upper body assist Assist Level: Minimal Assistance - Patient > 75%    Lower Body Dressing/Undressing Lower body dressing      What is the patient wearing?: Underwear/pull up, Pants     Lower body assist Assist for lower body dressing: Minimal Assistance - Patient > 75%     Toileting Toileting    Toileting assist Assist for toileting: Minimal  Assistance - Patient > 75%     Transfers Chair/bed transfer  Transfers assist     Chair/bed transfer assist level: Contact Guard/Touching assist Chair/bed transfer assistive device: Armrests, Programmer, multimedia   Ambulation assist      Assist level: Contact Guard/Touching assist Assistive device: Walker-rolling Max distance: 72ft   Walk 10 feet activity   Assist     Assist level: Contact Guard/Touching assist Assistive device: Walker-rolling   Walk 50 feet activity   Assist Walk 50 feet with 2 turns activity did not occur: Safety/medical concerns  Assist level: Contact Guard/Touching assist Assistive device: Walker-rolling    Walk 150 feet activity   Assist Walk 150 feet activity did not occur:  (fatigue, generalized weakness and deconditioning)  Assist level: Contact Guard/Touching assist Assistive device: Walker-rolling    Walk 10 feet  on uneven surface  activity   Assist Walk 10 feet on uneven surfaces activity did not occur: Safety/medical concerns (fatigue, decreased balance)         Wheelchair     Assist Will patient use wheelchair at discharge?: Yes Type of Wheelchair: Manual    Wheelchair assist level: Supervision/Verbal cueing Max wheelchair distance: 167ft    Wheelchair 50 feet with 2 turns activity    Assist        Assist Level: Supervision/Verbal cueing   Wheelchair 150 feet activity     Assist      Assist Level: Supervision/Verbal cueing   Blood pressure (!) 119/54, pulse 61, temperature 97.8 F (36.6 C), resp. rate 18, weight 84.4 kg, SpO2 90 %.  Medical Problem List and Plan: 1.  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  Status post stereotactic right temporal craniotomy resection of brain metastasis 09/03/2020. Has small 41mm Right frontal mass as well  Follow-up medical oncology as well as radiation oncology with radiation therapy as directed.  Continue Decadron protocol             -patient may shower once staples out             -ELOS/Goals: 22 days supervision  -Continue PT, OT and SLP    -After discussion with palliative care patient and wife have decided on DNR status  -provided dietary education to maximize benefits from outpatient immunotherapy.  2.  Antithrombotics: -DVT/anticoagulation: SCDs.             -antiplatelet therapy: N/A 3. Thoracic spinal pain: Lidocaine patch ordered. Added Norco PRN. Improved. Not requiring Norco. Decrease Norco to daily PRN- maintain this dose 4. Situational anxiety: Discontinue Ativan 0.25- no need for scans.              -antipsychotic agents: N/A 5. Neuropsych: This patient is capable of making decisions on his own behalf. 6. Skin/Wound Care: Routine skin checks. Staples d/ced on 6/14- nursing order placed. Site is c/d/i 7. Fluids/Electrolytes/Nutrition: Routine in and outs with follow-up chemistries 8.  Seizure prophylaxis.   d/c Keppra, completed course 9.  Hypertension.  d/c anti-hypertensives. Resolved. Now well controlled off medications: discussed with wife use of TEDs at home if hypotensive, but appears to be stable.  10.  Hyperlipidemia.  Continue Lipitor Vitals:   10/01/20 1928 10/02/20 0447  BP: 102/66 (!) 119/54  Pulse: 78 61  Resp: 18 18  Temp: 98.6 F (37 C) 97.8 F (36.6 C)  SpO2: 97% 90%  Controlled 10/02/2020 11.  Constipation.  Continue Senokot twice daily, Colace twice daily, Dulcolax suppository daily as needed. Received sorbitol once.  12. Hemorrhoids:  bleeding, painless, discussed hgb stable, repeat Monday 13. Increased weakness/slowed response: Head CT normal. Labs stable. UA/UC normal. Cr better. Magnesium normalized 14. AKI: now resolved, could have been contributing to worsening function: D/c HCTZ and lisinopril. 15. Hypomagnesium: supplemented with 2g IV 6/18  Has also helped with BP after stopping two anti-hypertensives.  16. Severe vitamin D deficiency: Continue Ergocalciferol 50,000U weekly x7 weeks 17. Insomnia: Continue Trazodone 100mg  HS PRN  18. Gas: resolved with cessation of Ensure.  19. CODE STATUS: DNR: palliative care involved, recommend cont palliative care as OP 20. Disposition: HFU scheduled with me on 11/22/20 at 2pm. D/c extended 1 week, d/c 6/29. Updated wife and son.  LOS: 22 days A FACE TO Gabriel [name] 10/02/2020, 8:38 AM

## 2020-10-03 ENCOUNTER — Telehealth: Payer: Self-pay | Admitting: Medical Oncology

## 2020-10-03 ENCOUNTER — Telehealth: Payer: Self-pay

## 2020-10-03 ENCOUNTER — Telehealth: Payer: Self-pay | Admitting: Internal Medicine

## 2020-10-03 NOTE — Telephone Encounter (Signed)
Per Dr Julien Nordmann , he wants to see pt first . Appt July 6th. I communicated this to [facility] at palliative care.   Please call her after the visit and let her know the plan .

## 2020-10-03 NOTE — Telephone Encounter (Signed)
Transitional Care call--who you talked with    Are you/is patient experiencing any problems since coming home? Are there any questions regarding any aspect of care? Caregivers are having a hard time moving the patient because pt is not cooperating in moving around they couldn't get him to get in the car and go to his cancer appt.  Are there any questions regarding medications administration/dosing? Are meds being taken as prescribed? Patient should review meds with caller to confirm All meds are being taken as prescribed . Have there been any falls? No falls . Has Home Health been to the house and/or have they contacted you? If not, have you tried to contact them? Can we help you contact them? Yes pt has all the equipment he needs . Are bowels and bladder emptying properly? Are there any unexpected incontinence issues? If applicable, is patient following bowel/bladder programs? Yes . Any fevers, problems with breathing, unexpected pain? No  Are there any skin problems or new areas of breakdown? No  Has the patient/family member arranged specialty MD follow up (ie cardiology/neurology/renal/surgical/etc)?  Can we help arrange?  Yes appts have been arranged . Does the patient need any other services or support that we can help arrange? No  Are caregivers following through as expected in assisting the patient? Yes he has a few caregivers one of them stays over night the other one goes in the AM.  Has the patient quit smoking, drinking alcohol, or using drugs as recommended? Yes  Appointment time, 2pm  arrive time  1:40 pm and who it is with here DR.Ori[place]

## 2020-10-03 NOTE — Telephone Encounter (Signed)
Referred to Palliative Care by hospitalist.   Is Gabriel [name] in agreement?

## 2020-10-03 NOTE — Telephone Encounter (Signed)
07/06-Requests video visit .  She cannot get him to the car.   [facility]. His BP is now stable.  He needs 2 people to assist him with walking. PT/OT/Speech rx  will be coming to his house to evaluate and treat pt.  Per Dr Julien Nordmann , postpone f/u appt until pt stronger. Schedule message sent for appt labs and f/u July 26th. Wife aware that a scheduler will call her. Appt cancelled for next week.

## 2020-10-03 NOTE — Telephone Encounter (Signed)
Scheduled appointment per 06/30 sch msg. Left message.

## 2020-10-03 NOTE — Telephone Encounter (Signed)
Spoke to spouse to follow up from the hospital she states shes having a hard time getting pt to get up and walk as he is having little or no energy and poor appetite and couldn't get him tog et in the car and go to his cancer appt . She is asking [place] if we have any advice for patient wont be as tired and to get him to move Pt has already reached out to them I told her maybe with Pt he will be more comfortable moving .

## 2020-10-06 ENCOUNTER — Encounter: Payer: Self-pay | Admitting: Internal Medicine

## 2020-10-08 ENCOUNTER — Other Ambulatory Visit: Payer: Self-pay

## 2020-10-08 ENCOUNTER — Encounter
Payer: Medicare Other | Attending: Physical Medicine and Rehabilitation | Admitting: Physical Medicine and Rehabilitation

## 2020-10-08 ENCOUNTER — Telehealth: Payer: Self-pay

## 2020-10-08 DIAGNOSIS — C439 Malignant melanoma of skin, unspecified: Secondary | ICD-10-CM

## 2020-10-08 DIAGNOSIS — R269 Unspecified abnormalities of gait and mobility: Secondary | ICD-10-CM | POA: Diagnosis not present

## 2020-10-08 DIAGNOSIS — C799 Secondary malignant neoplasm of unspecified site: Secondary | ICD-10-CM | POA: Diagnosis not present

## 2020-10-08 NOTE — Progress Notes (Deleted)
Subjective:    Patient ID: Gabriel [name], male    DOB: April 28, 1930, 87 y.o.   MRN: [id number]  HPI   Pain Inventory Average Pain {NUMBERS; 0-10:5044} Pain Right Now {NUMBERS; 0-10:5044} My pain is {PAIN DESCRIPTION:21022940}  In the last 24 hours, has pain interfered with the following? General activity {NUMBERS; 0-10:5044} Relation with others {NUMBERS; 0-10:5044} Enjoyment of life {NUMBERS; 0-10:5044} What TIME of day is your pain at its worst? {time of day:24191} Sleep (in general) {BHH GOOD/FAIR/POOR:22877}  Pain is worse with: {ACTIVITIES:21022942} Pain improves with: {PAIN IMPROVES GQQP:61950932} Relief from Meds: {NUMBERS; 0-10:5044}  {MOBILITY IZT:24580998}  {FUNCTION:21022946}  {NEURO/PSYCH:21022948}  {CPRM PRIOR STUDIES:21022953}  {CPRM PHYSICIANS INVOLVED IN YOUR CARE:21022954}    Family History  Problem Relation Age of Onset   Heart attack Mother    Heart disease Father    Cancer - Prostate Father    Cancer Brother        renal   Social History   Socioeconomic History   Marital status: Married    Spouse name: [name]   Number of children: 2   Years of education: 14   Highest education level: Not on file  Occupational History   Occupation: part time  Tobacco Use   Smoking status: Never   Smokeless tobacco: Never  Vaping Use   Vaping Use: Never used  Substance and Sexual Activity   Alcohol use: Yes    Alcohol/week: 1.0 standard drink    Types: 1 Cans of beer per week    Comment: 1 beer per week   Drug use: No   Sexual activity: Not on file  Other Topics Concern   Not on file  Social History Narrative   Lives with wife   Caffeine use: rare   Right handed    Social Determinants of Health   Financial Resource Strain: Not on file  Food Insecurity: Not on file  Transportation Needs: Not on file  Physical Activity: Not on file  Stress: Not on file  Social Connections: Not on file   Past Surgical History:  Procedure Laterality  Date   APPLICATION OF CRANIAL NAVIGATION N/A 09/03/2020   Procedure: [place];  Surgeon: [name], MD;  Location: [place];  Service: Neurosurgery;  Laterality: N/A;  RM 20   BRONCHIAL BIOPSY  08/20/2020   Procedure: BRONCHIAL BIOPSIES;  Surgeon: Garner Nash, DO;  Location: [place] [facility];  Service: Pulmonary;;   BRONCHIAL BRUSHINGS  08/20/2020   Procedure: BRONCHIAL BRUSHINGS;  Surgeon: Garner Nash, DO;  Location: [place];  Service: Pulmonary;;   BRONCHIAL NEEDLE ASPIRATION BIOPSY  08/20/2020   Procedure: BRONCHIAL NEEDLE ASPIRATION BIOPSIES;  Surgeon: Garner Nash, DO;  Location: [place];  Service: Pulmonary;;   BRONCHIAL WASHINGS  08/20/2020   Procedure: BRONCHIAL WASHINGS;  Surgeon: Garner Nash, DO;  Location: [place];  Service: Pulmonary;;   CRANIOTOMY Right 09/03/2020   Procedure: Stereotactic right temporal craniotomy for resection of tumor;  Surgeon: [name], MD;  Location: [place];  Service: Neurosurgery;  Laterality: Right;   HEMOSTASIS CONTROL  08/20/2020   Procedure: HEMOSTASIS CONTROL;  Surgeon: Garner Nash, DO;  Location: [place] [facility];  Service: Pulmonary;;   VIDEO BRONCHOSCOPY WITH ENDOBRONCHIAL NAVIGATION Left 08/20/2020   Procedure: VIDEO BRONCHOSCOPY WITH ENDOBRONCHIAL NAVIGATION;  Surgeon: Garner Nash, DO;  Location: [place];  Service: Pulmonary;  Laterality: Left;   VIDEO BRONCHOSCOPY WITH ENDOBRONCHIAL ULTRASOUND Left 08/20/2020   Procedure: VIDEO BRONCHOSCOPY WITH ENDOBRONCHIAL ULTRASOUND;  Surgeon:  Garner Nash, DO;  Location: [place] [facility];  Service: Pulmonary;  Laterality: Left;   Past Medical History:  Diagnosis Date   Cancer ([place])    melonoma on stomach, lung, brain   Gait disorder 02/04/2017   cane    High cholesterol    Hypertension    Pneumonia    Stroke ([place]) 04/2019 last one   gait unsteady- . uses a cane,2-3 mini strokes   There were no vitals taken for this  visit.  Opioid Risk Score:   Fall Risk Score:  `1  Depression screen PHQ 2/9  Depression screen [facility] 2/9 05/04/2017 04/02/2017 02/08/2017  Decreased Interest 0 0 0  Down, Depressed, Hopeless 0 0 0  PHQ - 2 Score 0 0 0    Review of Systems     Objective:   Physical Exam        Assessment & Plan:

## 2020-10-08 NOTE — Progress Notes (Signed)
Subjective:    Patient ID: Gabriel [name], male    DOB: 09-12-1930, 87 y.o.   MRN: [id number]  HPI An audio/video tele-health visit is felt to be the most appropriate encounter for this patient at this time. his is a follow up tele-visit via phone. The patient is at home. MD is at office.    Wife discusses that patient complained of a lot of weakness initially upon discharge to home. She has hired an Teacher, adult education to help her. Fortunately, this has improved and she was able to get him to and from bathroom today! He remains optimistic and they have been enjoying playing cards together every afternoon. She has checked his BP and it has been stable. She has bought an SPO2 machine and pulse oxygen saturations have been stable. She asks if there is anything else she should be doing.   Family History  Problem Relation Age of Onset   Heart attack Mother    Heart disease Father    Cancer - Prostate Father    Cancer Brother        renal   Social History   Socioeconomic History   Marital status: Married    Spouse name: [name]   Number of children: 2   Years of education: 14   Highest education level: Not on file  Occupational History   Occupation: part time  Tobacco Use   Smoking status: Never   Smokeless tobacco: Never  Vaping Use   Vaping Use: Never used  Substance and Sexual Activity   Alcohol use: Yes    Alcohol/week: 1.0 standard drink    Types: 1 Cans of beer per week    Comment: 1 beer per week   Drug use: No   Sexual activity: Not on file  Other Topics Concern   Not on file  Social History Narrative   Lives with wife   Caffeine use: rare   Right handed    Social Determinants of Health   Financial Resource Strain: Not on file  Food Insecurity: Not on file  Transportation Needs: Not on file  Physical Activity: Not on file  Stress: Not on file  Social Connections: Not on file   Past Surgical History:  Procedure Laterality Date   APPLICATION OF CRANIAL  NAVIGATION N/A 09/03/2020   Procedure: [place];  Surgeon: [name], MD;  Location: [place];  Service: Neurosurgery;  Laterality: N/A;  RM 20   BRONCHIAL BIOPSY  08/20/2020   Procedure: BRONCHIAL BIOPSIES;  Surgeon: Garner Nash, DO;  Location: [place] [facility];  Service: Pulmonary;;   BRONCHIAL BRUSHINGS  08/20/2020   Procedure: BRONCHIAL BRUSHINGS;  Surgeon: Garner Nash, DO;  Location: [place];  Service: Pulmonary;;   BRONCHIAL NEEDLE ASPIRATION BIOPSY  08/20/2020   Procedure: BRONCHIAL NEEDLE ASPIRATION BIOPSIES;  Surgeon: Garner Nash, DO;  Location: [place];  Service: Pulmonary;;   BRONCHIAL WASHINGS  08/20/2020   Procedure: BRONCHIAL WASHINGS;  Surgeon: Garner Nash, DO;  Location: [place];  Service: Pulmonary;;   CRANIOTOMY Right 09/03/2020   Procedure: Stereotactic right temporal craniotomy for resection of tumor;  Surgeon: [name], MD;  Location: [place];  Service: Neurosurgery;  Laterality: Right;   HEMOSTASIS CONTROL  08/20/2020   Procedure: HEMOSTASIS CONTROL;  Surgeon: Garner Nash, DO;  Location: [place] [facility];  Service: Pulmonary;;   VIDEO BRONCHOSCOPY WITH ENDOBRONCHIAL NAVIGATION Left 08/20/2020   Procedure: VIDEO BRONCHOSCOPY WITH ENDOBRONCHIAL NAVIGATION;  Surgeon: Garner Nash, DO;  Location:  [place] [facility];  Service: Pulmonary;  Laterality: Left;   VIDEO BRONCHOSCOPY WITH ENDOBRONCHIAL ULTRASOUND Left 08/20/2020   Procedure: VIDEO BRONCHOSCOPY WITH ENDOBRONCHIAL ULTRASOUND;  Surgeon: Garner Nash, DO;  Location: [place];  Service: Pulmonary;  Laterality: Left;   Past Medical History:  Diagnosis Date   Cancer ([place])    melonoma on stomach, lung, brain   Gait disorder 02/04/2017   cane    High cholesterol    Hypertension    Pneumonia    Stroke ([place]) 04/2019 last one   gait unsteady- . uses a cane,2-3 mini strokes   There were no vitals taken for this visit.  Opioid Risk Score:   Fall Risk  Score:  `1  Depression screen PHQ 2/9  Depression screen [facility] 2/9 05/04/2017 04/02/2017 02/08/2017  Decreased Interest 0 0 0  Down, Depressed, Hopeless 0 0 0  PHQ - 2 Score 0 0 0    Review of Systems     Objective:   Physical Exam Not performed       Assessment & Plan:  Gabriel [name] is an 87 year old man who is experiencing impaired gait and weakness following CIR admission s/p craniotomy for metastatic melanoma.  1) Impaired gait and weakness following CIR admission s/p craniotomy for metastatic melanoma. -encouraged wife that she is doing all the right things -he will have good and bad days -continue cards and other activities he enjoys to mentally engage him -BP is stable off anti-hypertensives -she has apparatus to check his CBGs if needed.  -encouraged high protein foods and shared examples -mobility has improved this week, she has assistance from aide, continue to walk as much as he can tolerate.  10 minutes spent in discussing patient's transition home, BP, appetite, SPO2, hypoglycemia, recommending high protein foods, letting her know she can call me any times she needs and to update me on his progress.

## 2020-10-08 NOTE — Telephone Encounter (Signed)
This LPN called and spoke with pt's wife regarding MyChart message concerns. [name] states over the weekend pt presented more week and was not even able to stand to get in w/c. Today, pt is able to stand and pivot to [facility] to use the bathroom. Pt's wife states he is currently napping in his w/c and should have PT tomorrow. This LPN advised wife to help with mobility even when pt is lying or sitting, flexing legs for him, as well as his feet. If there is no improvement after this, or after PT and pt is not even able to get in the car, he should return to ED. Pt's wife states she will stay in contact and let us know how he is doing.

## 2020-10-09 ENCOUNTER — Ambulatory Visit: Payer: Medicare Other | Admitting: Internal Medicine

## 2020-10-14 ENCOUNTER — Ambulatory Visit
Admission: RE | Admit: 2020-10-14 | Discharge: 2020-10-14 | Disposition: A | Discharge status: Home / Self Care | Payer: Medicare Other | Source: Ambulatory Visit | Attending: Internal Medicine | Admitting: Internal Medicine

## 2020-10-14 DIAGNOSIS — C3412 Malignant neoplasm of upper lobe, left bronchus or lung: Secondary | ICD-10-CM

## 2020-10-14 DIAGNOSIS — C439 Malignant melanoma of skin, unspecified: Secondary | ICD-10-CM

## 2020-10-14 DIAGNOSIS — C799 Secondary malignant neoplasm of unspecified site: Secondary | ICD-10-CM

## 2020-10-15 ENCOUNTER — Other Ambulatory Visit: Payer: Self-pay

## 2020-10-16 ENCOUNTER — Other Ambulatory Visit: Payer: Self-pay

## 2020-10-16 ENCOUNTER — Other Ambulatory Visit: Payer: Medicare Other

## 2020-10-16 ENCOUNTER — Other Ambulatory Visit: Payer: Medicare Other | Admitting: *Deleted

## 2020-10-16 VITALS — BP 108/72 | HR 83 | Temp 97.7°F | Resp 18

## 2020-10-16 DIAGNOSIS — Z515 Encounter for palliative care: Secondary | ICD-10-CM

## 2020-10-17 DIAGNOSIS — K649 Unspecified hemorrhoids: Secondary | ICD-10-CM

## 2020-10-17 DIAGNOSIS — E7800 Pure hypercholesterolemia, unspecified: Secondary | ICD-10-CM

## 2020-10-17 DIAGNOSIS — M546 Pain in thoracic spine: Secondary | ICD-10-CM

## 2020-10-17 DIAGNOSIS — E785 Hyperlipidemia, unspecified: Secondary | ICD-10-CM

## 2020-10-17 DIAGNOSIS — C7802 Secondary malignant neoplasm of left lung: Secondary | ICD-10-CM

## 2020-10-17 DIAGNOSIS — I10 Essential (primary) hypertension: Secondary | ICD-10-CM | POA: Diagnosis not present

## 2020-10-17 DIAGNOSIS — R2681 Unsteadiness on feet: Secondary | ICD-10-CM

## 2020-10-17 DIAGNOSIS — C7931 Secondary malignant neoplasm of brain: Secondary | ICD-10-CM

## 2020-10-17 DIAGNOSIS — G4700 Insomnia, unspecified: Secondary | ICD-10-CM

## 2020-10-17 DIAGNOSIS — C4359 Malignant melanoma of other part of trunk: Secondary | ICD-10-CM | POA: Diagnosis not present

## 2020-10-17 DIAGNOSIS — I69398 Other sequelae of cerebral infarction: Secondary | ICD-10-CM

## 2020-10-17 DIAGNOSIS — N2889 Other specified disorders of kidney and ureter: Secondary | ICD-10-CM

## 2020-10-17 DIAGNOSIS — F4322 Adjustment disorder with anxiety: Secondary | ICD-10-CM

## 2020-10-17 DIAGNOSIS — K5900 Constipation, unspecified: Secondary | ICD-10-CM

## 2020-10-19 NOTE — Progress Notes (Signed)
COMMUNITY PALLIATIVE CARE SW NOTE  PATIENT NAME: Gabriel [name] DOB: February 27, 1931 MRN: [id number]  PRIMARY CARE PROVIDER: [name], MD  RESPONSIBLE PARTY:  Acct ID - Guarantor Home Phone Work Phone Relationship Acct Type  [id number] Gabriel, [name]* [phone]  Self P/F     [address] [place], [place] [address]     PLAN OF CARE and INTERVENTIONS:             GOALS OF CARE/ ADVANCE CARE PLANNING:  Goal is to keep patient at home. Patient is a DNR. SOCIAL/EMOTIONAL/SPIRITUAL ASSESSMENT/ INTERVENTIONS:  SW and RN-Gabriel [name] completed and initial visit with patient and his wife at their home. SW and RN spoke with his wife initially, providing education to her regarding palliative care, role in patient's care, visit frequency and how to access services. PCG verbalized understanding and provided verbal consent for services.  She provided a status update on patient. Patient went from walking with his walker to now only being able to stand and pivot. He has some incontinent episodes. Patient will have around the clock care through [facility]. His wife is having difficulty getting patient up and the additional care will assist with that. Patient has a good appetite. He enjoys social stimulation. Patient has some pain to his back that is treated with Tylenol. Patient is scheduled to have a therapy treatment for cancer that his wife would like assistance with getting transportation for this appointment. SOCIAL HISTORY: Patient was born and raised in [place], [place]. He completed two years of college. He served in [employer] for two years. He has been married to his wife for 54 years and they two children : Gabriel [name]. Patient worked in Insurance underwriter for 65 years. He is a member of [employer]. He has a DNR. His wife serves as his POA/HCPOA and a Living Will. SW provided supportive presence, active listening, education and reassurance of support.  PATIENT/CAREGIVER EDUCATION/ COPING:   Patient and his wife appear to be coping adequately. PERSONAL EMERGENCY PLAN:  911 can be accessed for emergencies. COMMUNITY RESOURCES COORDINATION/ HEALTH CARE NAVIGATION:  Patient has PT, OT and ST through  [facility]. He has 24 hour caregivers through [facility]. FINANCIAL/LEGAL CONCERNS/INTERVENTIONS:  None.     SOCIAL HX:  Social History   Tobacco Use   Smoking status: Never   Smokeless tobacco: Never  Substance Use Topics   Alcohol use: Yes    Alcohol/week: 1.0 standard drink    Types: 1 Cans of beer per week    Comment: 1 beer per week    CODE STATUS: DNR ADVANCED DIRECTIVES: Yes MOST FORM COMPLETE:  No HOSPICE EDUCATION PROVIDED: None  PPS: Patient is dependent for personal care needs; he is able to verbalize his needs.  Visit and Documentation: 60 minutes.    [address] [place], [place]

## 2020-10-21 NOTE — Progress Notes (Signed)
  Radiation Oncology         [phone] ________________________________  Name: Gabriel [name] MRN: [id number]  Date of Service: 10/14/2020  DOB: March 28, 1931  Post Treatment Telephone Note  Diagnosis:    Metastatic melanoma   Interval Since Last Radiation:  6 weeks   08/27/2020 through 09/12/2020 Lung, Left: Lung_Lt 3D 30/30 3 10/10 6X, 10X    08/30/20 SRS Treatment:     PTV_1 Rt Temp 30mm was treated to 13Gy in one fraction. PTV_2 Lt Front 37mm was treated to 20 Gy in one fraction. PTV_3 Rt Par 12mm: was treated to 20 Gy in one fraction.  Narrative:  The patient was contacted today for routine follow-up. During treatment he did very well with radiotherapy and did not have significant desquamation.   Impression/Plan: 1.  Metastatic melanoma . The patient has been doing well since completion of radiotherapy. I was unable to reach the patient but will contact him again in September when he's due for his next MRI brain. He will also follow up with Dr. Julien Nordmann next week     Carola Rhine, [facility]

## 2020-10-22 NOTE — Progress Notes (Signed)
[facility] PALLIATIVE CARE RN NOTE  PATIENT NAME: Gabriel [name] DOB: Jul 10, 1930 MRN: [id number]  PRIMARY CARE PROVIDER: [name], MD  RESPONSIBLE PARTY: Alesia Banda (wife) Acct ID - Guarantor Home Phone Work Phone Relationship Acct Type  [id number] DIMITRIS, SHANAHAN* [phone]  Self P/F     [address] [place], [place] [address]   Covid-19 Pre-screening Negative  PLAN OF CARE and INTERVENTION:  ADVANCE CARE PLANNING/GOALS OF CARE: Goal is to keep patient at home and have the help they need.  PATIENT/CAREGIVER EDUCATION: Explained palliative care services, symptom management, safe transfers, s/s of infection DISEASE STATUS: Joint initial palliative care home visit completed with Katheren Puller, MSW. Met with patient, wife and hired caregiver in the home. Wife provided patient's health history regarding his metastatic melanoma. He recently had brain surgery for tumor removal and 10 radiation treatments for lung mets. Prior to surgery, patient was still able to drive and perform ADLs independently. He is able to stand, but walks very little with assistance due to weakness. He is able to stand and pivot to bedside commode with assistance. He is now sleeping in the recliner vs the bed due to difficulties getting out of bed. She has a caregiver from [facility] that comes almost around the clock. He is receiving [facility] and has a session today. He is to also start OT and ST this week. He denies pain at this time, but does experience back pain at times. He will take Tylenol as needed. Respirations are even, regular and unlabored. He is able to answer questions, but is taking longer to process what is being said. Some confusion. His appetite is good and is on an appetite stimulant. No dysphagia. He is continent of both bowel and bladder. Wife says that patient was too weak to make it to a follow up appointment with his Oncologist so this was  re-scheduled for 2 weeks. They are to discuss immunotherapy every 3 weeks. MSW to work on assisting with transportation. They are agreeable to future visits with palliative care. Will continue to monitor.  CODE STATUS: DNR ADVANCED DIRECTIVES: Y MOST FORM: yes PPS: 40%   PHYSICAL EXAM:   VITALS: Today's Vitals   10/16/20 1245  BP: 108/72  Pulse: 83  Resp: 18  Temp: 97.7 F (36.5 C)  TempSrc: Temporal  SpO2: 99%  PainSc: 0-No pain    LUNGS: clear to auscultation  CARDIAC: Cor RRR EXTREMITIES: Trace edema to bilateral lower extremities SKIN:  Exposed skin is dry and intact   NEURO:  Alert and oriented to person/place, some confusion, slow to process, generalized weakness, semi-ambulatory w/walker and 1 person assist w/gait belt   (Duration of visit and documentation 75 minutes)   Daryl Eastern, RN BSN

## 2020-10-24 ENCOUNTER — Telehealth: Payer: Self-pay

## 2020-10-24 NOTE — Telephone Encounter (Signed)
Follow up call placed to patient's wife, [name], to check in. [name] shared that [facility] nurse had been out today to address wounds. Denies any further needs at this time.

## 2020-10-24 NOTE — Telephone Encounter (Signed)
Received message that patient's wife had called to report that wounds appear to be worsening. Notified Home Health agency who stated they will f/u

## 2020-10-24 NOTE — Telephone Encounter (Signed)
Pts wife called stating pt has continued to significantly declined since his surgery and is now at a point of being unable to walk. She states they currently have palliative care and will discuss transitioning to hospice with pts PCP at another time. Mrs. [facility] requests to cancel pts lab and OV with Dr. Julien Nordmann. I have advised her to please call us back should anything change. Mrs. Sapien expressed understanding of this information.

## 2020-10-25 ENCOUNTER — Other Ambulatory Visit: Payer: Self-pay | Admitting: *Deleted

## 2020-10-25 MED ORDER — PANTOPRAZOLE SODIUM 40 MG PO TBEC: 40.0000 mg | DELAYED_RELEASE_TABLET | Freq: Every day | ORAL | 0 refills | Status: AC

## 2020-10-25 MED ORDER — MIRTAZAPINE 7.5 MG PO TABS: 7.5000 mg | ORAL_TABLET | Freq: Every day | ORAL | 0 refills | Status: AC

## 2020-10-29 ENCOUNTER — Other Ambulatory Visit: Payer: Medicare Other

## 2020-10-29 ENCOUNTER — Other Ambulatory Visit: Payer: Self-pay | Admitting: Radiation Therapy

## 2020-10-29 ENCOUNTER — Ambulatory Visit: Payer: Medicare Other | Admitting: Internal Medicine

## 2020-11-06 ENCOUNTER — Telehealth: Payer: Self-pay

## 2020-11-06 NOTE — Telephone Encounter (Signed)
(  3:34 pm) SW completed a follow-up a call to patient's wife regarding her inquiry into having a physician follow patient at home as she is no longer able to take patient out. SW provided resources, however patent's PCP will follow him at home. His wife if happy about this decision as they have a long standing relationship with patient. No other concerns were noted.

## 2020-11-12 ENCOUNTER — Telehealth: Payer: Self-pay

## 2020-11-12 NOTE — Telephone Encounter (Signed)
Phone call placed to patient's wife,Suzanne, to f/u in response to message received. [name] shared that patient is having a lot of pain to his left hip. She questions if the cancer has now spread to his bone. Patient was seen by PT and OT on Thursday and Friday of last week to practice transfers. During the process, [name] noted that patient had a lot of pain and it has continued since then. [name] stated she would have to call back as patient's doctor was calling.

## 2020-11-12 NOTE — Telephone Encounter (Signed)
Notified [facility] that patient is transitioning to hospice services and wife requested to cancel today's visit with speech therapy.

## 2020-11-12 NOTE — Telephone Encounter (Signed)
Returned call to [name] who shared that Dr. Mancel Bale came to the home and examined patient. MD did not feel that there was anything fractured but said he suspected that patient had probable bone mets. [name] shared that after discussion with the patient and MD, they would like patient to transition to hospice services. Will reach out to PCP for verbal order

## 2020-11-12 NOTE — Telephone Encounter (Signed)
Verbal order received for hospice eval. Dr.Roberts will remain attending. Hospice referral team made aware

## 2020-11-22 ENCOUNTER — Inpatient Hospital Stay: Payer: Medicare Other | Admitting: Physical Medicine and Rehabilitation

## 2020-12-11 ENCOUNTER — Other Ambulatory Visit (HOSPITAL_COMMUNITY): Payer: Self-pay

## 2020-12-11 MED ORDER — METHADONE HCL 10 MG/ML PO CONC
ORAL | 0 refills | Status: AC
  Filled 2020-12-11: qty 30, 30d supply, fill #0

## 2020-12-12 ENCOUNTER — Other Ambulatory Visit: Payer: Medicare Other

## 2020-12-13 ENCOUNTER — Other Ambulatory Visit (HOSPITAL_COMMUNITY): Payer: Self-pay

## 2020-12-16 ENCOUNTER — Ambulatory Visit: Payer: Self-pay | Admitting: Radiation Oncology

## 2020-12-16 ENCOUNTER — Telehealth: Payer: Self-pay | Admitting: Neurology

## 2020-12-16 NOTE — Telephone Encounter (Signed)
Pt's wife called, states her husband is in his last stages of stage 4 brain cancer and will not be with [place] much longer. Wife is wanting Dr. Leonie Man to know ands also wanting to speak with the nurse to inform them what is going on.

## 2020-12-17 NOTE — Telephone Encounter (Addendum)
I returned the call to the patient's wife. 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 She has cancelled the yearly follow up appt. After the stroke study is complete, she would like to know if he was on the actual medication or a placebo.

## 2020-12-23 ENCOUNTER — Other Ambulatory Visit (HOSPITAL_COMMUNITY): Payer: Self-pay

## 2021-01-04 DEATH — deceased

## 2021-01-23 ENCOUNTER — Ambulatory Visit: Payer: Medicare Other | Admitting: Neurology
